# Patient Record
Sex: Female | Born: 1965
Health system: Southern US, Community
[De-identification: ages and names within clinical notes are randomized; demographics above are authoritative.]

## PROBLEM LIST (undated history)

## (undated) DIAGNOSIS — T4145XA Adverse effect of unspecified anesthetic, initial encounter: Secondary | ICD-10-CM

## (undated) DIAGNOSIS — K219 Gastro-esophageal reflux disease without esophagitis: Secondary | ICD-10-CM

## (undated) DIAGNOSIS — R350 Frequency of micturition: Secondary | ICD-10-CM

## (undated) DIAGNOSIS — I10 Essential (primary) hypertension: Secondary | ICD-10-CM

## (undated) DIAGNOSIS — F419 Anxiety disorder, unspecified: Secondary | ICD-10-CM

## (undated) DIAGNOSIS — T8859XA Other complications of anesthesia, initial encounter: Secondary | ICD-10-CM

## (undated) DIAGNOSIS — F329 Major depressive disorder, single episode, unspecified: Secondary | ICD-10-CM

## (undated) DIAGNOSIS — G8929 Other chronic pain: Secondary | ICD-10-CM

## (undated) DIAGNOSIS — D649 Anemia, unspecified: Secondary | ICD-10-CM

## (undated) DIAGNOSIS — D573 Sickle-cell trait: Secondary | ICD-10-CM

## (undated) DIAGNOSIS — M549 Dorsalgia, unspecified: Secondary | ICD-10-CM

## (undated) DIAGNOSIS — E785 Hyperlipidemia, unspecified: Secondary | ICD-10-CM

## (undated) DIAGNOSIS — F32A Depression, unspecified: Secondary | ICD-10-CM

## (undated) DIAGNOSIS — E039 Hypothyroidism, unspecified: Secondary | ICD-10-CM

## (undated) DIAGNOSIS — K5900 Constipation, unspecified: Secondary | ICD-10-CM

## (undated) DIAGNOSIS — R109 Unspecified abdominal pain: Secondary | ICD-10-CM

## (undated) DIAGNOSIS — G4730 Sleep apnea, unspecified: Secondary | ICD-10-CM

## (undated) DIAGNOSIS — M1990 Unspecified osteoarthritis, unspecified site: Secondary | ICD-10-CM

## (undated) HISTORY — DX: Depression, unspecified: F32.A

## (undated) HISTORY — DX: Hypothyroidism, unspecified: E03.9

## (undated) HISTORY — DX: Dorsalgia, unspecified: M54.9

## (undated) HISTORY — DX: Unspecified abdominal pain: R10.9

## (undated) HISTORY — DX: Constipation, unspecified: K59.00

## (undated) HISTORY — DX: Gastro-esophageal reflux disease without esophagitis: K21.9

## (undated) HISTORY — DX: Anemia, unspecified: D64.9

## (undated) HISTORY — DX: Anxiety disorder, unspecified: F41.9

## (undated) HISTORY — DX: Other chronic pain: G89.29

## (undated) HISTORY — DX: Hyperlipidemia, unspecified: E78.5

## (undated) HISTORY — PX: TUBAL LIGATION: SHX77

## (undated) HISTORY — DX: Sleep apnea, unspecified: G47.30

## (undated) HISTORY — DX: Unspecified osteoarthritis, unspecified site: M19.90

## (undated) HISTORY — DX: Major depressive disorder, single episode, unspecified: F32.9

## (undated) HISTORY — DX: Essential (primary) hypertension: I10

---

## 1999-10-03 ENCOUNTER — Encounter: Payer: Self-pay | Admitting: General Practice

## 1999-10-03 ENCOUNTER — Encounter: Admission: RE | Admit: 1999-10-03 | Discharge: 1999-10-03 | Payer: Self-pay | Admitting: General Practice

## 1999-12-15 DIAGNOSIS — G8929 Other chronic pain: Secondary | ICD-10-CM

## 1999-12-15 HISTORY — DX: Other chronic pain: G89.29

## 1999-12-24 ENCOUNTER — Encounter: Payer: Self-pay | Admitting: General Practice

## 1999-12-24 ENCOUNTER — Encounter: Admission: RE | Admit: 1999-12-24 | Discharge: 1999-12-24 | Payer: Self-pay | Admitting: Family Medicine

## 2000-02-25 ENCOUNTER — Encounter
Admission: RE | Admit: 2000-02-25 | Discharge: 2000-05-25 | Payer: Self-pay | Admitting: Physical Medicine & Rehabilitation

## 2000-05-20 ENCOUNTER — Emergency Department (HOSPITAL_COMMUNITY): Admission: EM | Admit: 2000-05-20 | Discharge: 2000-05-20 | Payer: Self-pay | Admitting: Emergency Medicine

## 2000-05-20 ENCOUNTER — Emergency Department (HOSPITAL_COMMUNITY): Admission: EM | Admit: 2000-05-20 | Discharge: 2000-05-21 | Payer: Self-pay | Admitting: Emergency Medicine

## 2001-11-11 ENCOUNTER — Encounter: Payer: Self-pay | Admitting: Emergency Medicine

## 2001-11-11 ENCOUNTER — Emergency Department (HOSPITAL_COMMUNITY): Admission: EM | Admit: 2001-11-11 | Discharge: 2001-11-11 | Payer: Self-pay | Admitting: Emergency Medicine

## 2001-12-14 DIAGNOSIS — E039 Hypothyroidism, unspecified: Secondary | ICD-10-CM

## 2001-12-14 HISTORY — DX: Hypothyroidism, unspecified: E03.9

## 2002-04-23 ENCOUNTER — Inpatient Hospital Stay (HOSPITAL_COMMUNITY): Admission: EM | Admit: 2002-04-23 | Discharge: 2002-04-26 | Payer: Self-pay | Admitting: *Deleted

## 2002-07-14 ENCOUNTER — Encounter: Payer: Self-pay | Admitting: General Practice

## 2002-07-14 ENCOUNTER — Encounter: Admission: RE | Admit: 2002-07-14 | Discharge: 2002-07-14 | Payer: Self-pay | Admitting: General Practice

## 2002-08-10 ENCOUNTER — Inpatient Hospital Stay (HOSPITAL_COMMUNITY): Admission: AD | Admit: 2002-08-10 | Discharge: 2002-08-14 | Payer: Self-pay

## 2002-08-10 ENCOUNTER — Encounter: Payer: Self-pay | Admitting: Emergency Medicine

## 2002-08-24 ENCOUNTER — Encounter: Payer: Self-pay | Admitting: General Practice

## 2002-08-24 ENCOUNTER — Encounter: Admission: RE | Admit: 2002-08-24 | Discharge: 2002-08-24 | Payer: Self-pay | Admitting: General Practice

## 2002-09-07 ENCOUNTER — Encounter: Admission: RE | Admit: 2002-09-07 | Discharge: 2002-10-16 | Payer: Self-pay | Admitting: General Practice

## 2002-09-11 ENCOUNTER — Ambulatory Visit (HOSPITAL_COMMUNITY): Admission: RE | Admit: 2002-09-11 | Discharge: 2002-09-11 | Payer: Self-pay | Admitting: General Surgery

## 2002-09-12 ENCOUNTER — Encounter (HOSPITAL_BASED_OUTPATIENT_CLINIC_OR_DEPARTMENT_OTHER): Payer: Self-pay | Admitting: General Surgery

## 2002-09-19 ENCOUNTER — Encounter: Payer: Self-pay | Admitting: Internal Medicine

## 2002-09-19 ENCOUNTER — Encounter (HOSPITAL_COMMUNITY): Admission: RE | Admit: 2002-09-19 | Discharge: 2002-12-18 | Payer: Self-pay | Admitting: Internal Medicine

## 2002-11-09 ENCOUNTER — Emergency Department (HOSPITAL_COMMUNITY): Admission: EM | Admit: 2002-11-09 | Discharge: 2002-11-09 | Payer: Self-pay | Admitting: Emergency Medicine

## 2002-12-13 ENCOUNTER — Encounter: Admission: RE | Admit: 2002-12-13 | Discharge: 2003-01-19 | Payer: Self-pay | Admitting: Orthopaedic Surgery

## 2002-12-14 HISTORY — PX: CYSTOCELE REPAIR: SHX163

## 2002-12-14 HISTORY — PX: BLADDER SUSPENSION: SHX72

## 2003-02-14 ENCOUNTER — Encounter: Payer: Self-pay | Admitting: General Practice

## 2003-02-14 ENCOUNTER — Encounter: Admission: RE | Admit: 2003-02-14 | Discharge: 2003-02-14 | Payer: Self-pay | Admitting: General Practice

## 2003-02-14 ENCOUNTER — Encounter (INDEPENDENT_AMBULATORY_CARE_PROVIDER_SITE_OTHER): Payer: Self-pay | Admitting: *Deleted

## 2003-04-27 ENCOUNTER — Observation Stay (HOSPITAL_COMMUNITY): Admission: RE | Admit: 2003-04-27 | Discharge: 2003-04-28 | Payer: Self-pay | Admitting: Urology

## 2003-09-08 ENCOUNTER — Emergency Department (HOSPITAL_COMMUNITY): Admission: EM | Admit: 2003-09-08 | Discharge: 2003-09-08 | Payer: Self-pay

## 2003-11-02 ENCOUNTER — Encounter: Admission: RE | Admit: 2003-11-02 | Discharge: 2003-11-02 | Payer: Self-pay | Admitting: Internal Medicine

## 2003-11-22 ENCOUNTER — Encounter: Admission: RE | Admit: 2003-11-22 | Discharge: 2003-11-22 | Payer: Self-pay | Admitting: Internal Medicine

## 2003-12-19 ENCOUNTER — Encounter: Admission: RE | Admit: 2003-12-19 | Discharge: 2003-12-19 | Payer: Self-pay | Admitting: Internal Medicine

## 2004-02-01 ENCOUNTER — Emergency Department (HOSPITAL_COMMUNITY): Admission: EM | Admit: 2004-02-01 | Discharge: 2004-02-01 | Payer: Self-pay | Admitting: Family Medicine

## 2004-02-12 ENCOUNTER — Encounter: Admission: RE | Admit: 2004-02-12 | Discharge: 2004-02-12 | Payer: Self-pay | Admitting: Internal Medicine

## 2004-02-26 ENCOUNTER — Encounter: Admission: RE | Admit: 2004-02-26 | Discharge: 2004-02-26 | Payer: Self-pay | Admitting: Internal Medicine

## 2004-04-29 ENCOUNTER — Emergency Department (HOSPITAL_COMMUNITY): Admission: EM | Admit: 2004-04-29 | Discharge: 2004-04-30 | Payer: Self-pay | Admitting: Emergency Medicine

## 2004-05-14 ENCOUNTER — Encounter (INDEPENDENT_AMBULATORY_CARE_PROVIDER_SITE_OTHER): Payer: Self-pay | Admitting: *Deleted

## 2004-05-14 ENCOUNTER — Encounter: Admission: RE | Admit: 2004-05-14 | Discharge: 2004-05-14 | Payer: Self-pay | Admitting: Internal Medicine

## 2004-05-14 ENCOUNTER — Other Ambulatory Visit: Admission: RE | Admit: 2004-05-14 | Discharge: 2004-05-14 | Payer: Self-pay | Admitting: Internal Medicine

## 2004-05-14 LAB — CONVERTED CEMR LAB: Pap Smear: NORMAL

## 2004-07-30 ENCOUNTER — Encounter: Admission: RE | Admit: 2004-07-30 | Discharge: 2004-07-30 | Payer: Self-pay | Admitting: Internal Medicine

## 2004-07-31 ENCOUNTER — Encounter: Admission: RE | Admit: 2004-07-31 | Discharge: 2004-08-15 | Payer: Self-pay | Admitting: Orthopaedic Surgery

## 2004-08-11 ENCOUNTER — Encounter: Admission: RE | Admit: 2004-08-11 | Discharge: 2004-08-11 | Payer: Self-pay | Admitting: Internal Medicine

## 2004-09-29 ENCOUNTER — Ambulatory Visit: Payer: Self-pay | Admitting: Internal Medicine

## 2004-10-01 ENCOUNTER — Ambulatory Visit: Payer: Self-pay | Admitting: Internal Medicine

## 2004-11-04 ENCOUNTER — Emergency Department (HOSPITAL_COMMUNITY): Admission: EM | Admit: 2004-11-04 | Discharge: 2004-11-04 | Payer: Self-pay | Admitting: Family Medicine

## 2004-12-14 HISTORY — PX: REVISION URINARY SLING: SHX6213

## 2004-12-25 ENCOUNTER — Ambulatory Visit: Payer: Self-pay | Admitting: Internal Medicine

## 2005-01-09 ENCOUNTER — Emergency Department (HOSPITAL_COMMUNITY): Admission: EM | Admit: 2005-01-09 | Discharge: 2005-01-09 | Payer: Self-pay | Admitting: Family Medicine

## 2005-04-15 ENCOUNTER — Ambulatory Visit: Payer: Self-pay | Admitting: Internal Medicine

## 2005-05-20 ENCOUNTER — Ambulatory Visit: Payer: Self-pay | Admitting: Internal Medicine

## 2005-06-03 ENCOUNTER — Ambulatory Visit: Payer: Self-pay | Admitting: Internal Medicine

## 2005-09-22 ENCOUNTER — Ambulatory Visit: Payer: Self-pay | Admitting: Hospitalist

## 2005-10-16 ENCOUNTER — Ambulatory Visit (HOSPITAL_COMMUNITY): Admission: RE | Admit: 2005-10-16 | Discharge: 2005-10-16 | Payer: Self-pay | Admitting: Urology

## 2006-02-12 ENCOUNTER — Ambulatory Visit: Payer: Self-pay | Admitting: Internal Medicine

## 2006-04-07 ENCOUNTER — Emergency Department (HOSPITAL_COMMUNITY): Admission: EM | Admit: 2006-04-07 | Discharge: 2006-04-07 | Payer: Self-pay | Admitting: Family Medicine

## 2006-06-01 ENCOUNTER — Ambulatory Visit: Payer: Self-pay | Admitting: Internal Medicine

## 2006-06-15 ENCOUNTER — Ambulatory Visit: Payer: Self-pay | Admitting: Internal Medicine

## 2006-07-27 ENCOUNTER — Ambulatory Visit: Payer: Self-pay | Admitting: Internal Medicine

## 2006-10-21 ENCOUNTER — Encounter (INDEPENDENT_AMBULATORY_CARE_PROVIDER_SITE_OTHER): Payer: Self-pay | Admitting: *Deleted

## 2006-10-21 DIAGNOSIS — F329 Major depressive disorder, single episode, unspecified: Secondary | ICD-10-CM

## 2006-10-21 DIAGNOSIS — E039 Hypothyroidism, unspecified: Secondary | ICD-10-CM | POA: Insufficient documentation

## 2006-11-06 ENCOUNTER — Encounter: Admission: RE | Admit: 2006-11-06 | Discharge: 2006-11-06 | Payer: Self-pay | Admitting: Orthopaedic Surgery

## 2006-11-24 ENCOUNTER — Encounter: Admission: RE | Admit: 2006-11-24 | Discharge: 2006-12-17 | Payer: Self-pay | Admitting: Orthopaedic Surgery

## 2006-12-03 ENCOUNTER — Other Ambulatory Visit: Admission: RE | Admit: 2006-12-03 | Discharge: 2006-12-03 | Payer: Self-pay | Admitting: Internal Medicine

## 2006-12-03 ENCOUNTER — Ambulatory Visit: Payer: Self-pay | Admitting: Internal Medicine

## 2006-12-03 DIAGNOSIS — M1990 Unspecified osteoarthritis, unspecified site: Secondary | ICD-10-CM | POA: Insufficient documentation

## 2006-12-03 DIAGNOSIS — J309 Allergic rhinitis, unspecified: Secondary | ICD-10-CM | POA: Insufficient documentation

## 2006-12-03 DIAGNOSIS — Z9889 Other specified postprocedural states: Secondary | ICD-10-CM

## 2006-12-03 DIAGNOSIS — F319 Bipolar disorder, unspecified: Secondary | ICD-10-CM

## 2006-12-14 HISTORY — PX: CYSTOSCOPY: SUR368

## 2007-02-11 ENCOUNTER — Telehealth (INDEPENDENT_AMBULATORY_CARE_PROVIDER_SITE_OTHER): Payer: Self-pay | Admitting: *Deleted

## 2007-02-28 ENCOUNTER — Ambulatory Visit (HOSPITAL_COMMUNITY): Admission: RE | Admit: 2007-02-28 | Discharge: 2007-02-28 | Payer: Self-pay | Admitting: Urology

## 2007-07-01 ENCOUNTER — Emergency Department (HOSPITAL_COMMUNITY): Admission: EM | Admit: 2007-07-01 | Discharge: 2007-07-01 | Payer: Self-pay | Admitting: Emergency Medicine

## 2007-11-05 ENCOUNTER — Emergency Department (HOSPITAL_COMMUNITY): Admission: EM | Admit: 2007-11-05 | Discharge: 2007-11-05 | Payer: Self-pay | Admitting: Emergency Medicine

## 2007-11-09 ENCOUNTER — Emergency Department (HOSPITAL_COMMUNITY): Admission: EM | Admit: 2007-11-09 | Discharge: 2007-11-09 | Payer: Self-pay | Admitting: Neurosurgery

## 2007-12-15 DIAGNOSIS — R109 Unspecified abdominal pain: Secondary | ICD-10-CM

## 2007-12-15 HISTORY — DX: Unspecified abdominal pain: R10.9

## 2007-12-23 ENCOUNTER — Telehealth: Payer: Self-pay | Admitting: *Deleted

## 2008-01-26 ENCOUNTER — Encounter (INDEPENDENT_AMBULATORY_CARE_PROVIDER_SITE_OTHER): Payer: Self-pay | Admitting: Internal Medicine

## 2008-01-26 ENCOUNTER — Encounter: Admission: RE | Admit: 2008-01-26 | Discharge: 2008-01-26 | Payer: Self-pay | Admitting: Internal Medicine

## 2008-01-26 ENCOUNTER — Ambulatory Visit: Payer: Self-pay | Admitting: Internal Medicine

## 2008-01-26 ENCOUNTER — Inpatient Hospital Stay (HOSPITAL_COMMUNITY): Admission: AD | Admit: 2008-01-26 | Discharge: 2008-01-27 | Payer: Self-pay | Admitting: Infectious Diseases

## 2008-01-26 ENCOUNTER — Ambulatory Visit: Payer: Self-pay | Admitting: Infectious Diseases

## 2008-01-26 LAB — CONVERTED CEMR LAB
Beta hcg, urine, semiquantitative: NEGATIVE
Blood in Urine, dipstick: NEGATIVE
Ketones, urine, test strip: NEGATIVE

## 2008-01-30 ENCOUNTER — Telehealth (INDEPENDENT_AMBULATORY_CARE_PROVIDER_SITE_OTHER): Payer: Self-pay | Admitting: Internal Medicine

## 2008-01-30 ENCOUNTER — Encounter (INDEPENDENT_AMBULATORY_CARE_PROVIDER_SITE_OTHER): Payer: Self-pay | Admitting: Hospitalist

## 2008-02-02 ENCOUNTER — Ambulatory Visit: Payer: Self-pay | Admitting: Internal Medicine

## 2008-02-02 ENCOUNTER — Encounter (INDEPENDENT_AMBULATORY_CARE_PROVIDER_SITE_OTHER): Payer: Self-pay | Admitting: *Deleted

## 2008-02-02 DIAGNOSIS — G5600 Carpal tunnel syndrome, unspecified upper limb: Secondary | ICD-10-CM

## 2008-02-02 LAB — CONVERTED CEMR LAB
Bilirubin Urine: NEGATIVE
Hemoglobin, Urine: NEGATIVE
Protein, ur: NEGATIVE mg/dL
Specific Gravity, Urine: 1.027 (ref 1.005–1.03)
Urine Glucose: NEGATIVE mg/dL
Urobilinogen, UA: 0.2 (ref 0.0–1.0)

## 2008-02-03 ENCOUNTER — Telehealth (INDEPENDENT_AMBULATORY_CARE_PROVIDER_SITE_OTHER): Payer: Self-pay | Admitting: *Deleted

## 2008-02-17 ENCOUNTER — Ambulatory Visit: Payer: Self-pay | Admitting: Hospitalist

## 2008-02-17 DIAGNOSIS — N950 Postmenopausal bleeding: Secondary | ICD-10-CM | POA: Insufficient documentation

## 2008-02-20 ENCOUNTER — Other Ambulatory Visit: Admission: RE | Admit: 2008-02-20 | Discharge: 2008-02-20 | Payer: Self-pay | Admitting: Gynecology

## 2008-02-20 ENCOUNTER — Encounter (INDEPENDENT_AMBULATORY_CARE_PROVIDER_SITE_OTHER): Payer: Self-pay | Admitting: *Deleted

## 2008-03-13 ENCOUNTER — Ambulatory Visit (HOSPITAL_BASED_OUTPATIENT_CLINIC_OR_DEPARTMENT_OTHER): Admission: RE | Admit: 2008-03-13 | Discharge: 2008-03-13 | Payer: Self-pay | Admitting: Urology

## 2008-05-09 ENCOUNTER — Encounter (INDEPENDENT_AMBULATORY_CARE_PROVIDER_SITE_OTHER): Payer: Self-pay | Admitting: *Deleted

## 2008-05-09 ENCOUNTER — Ambulatory Visit (HOSPITAL_COMMUNITY): Admission: RE | Admit: 2008-05-09 | Discharge: 2008-05-09 | Payer: Self-pay | Admitting: *Deleted

## 2008-05-09 ENCOUNTER — Ambulatory Visit: Payer: Self-pay | Admitting: *Deleted

## 2008-05-09 DIAGNOSIS — T7411XA Adult physical abuse, confirmed, initial encounter: Secondary | ICD-10-CM | POA: Insufficient documentation

## 2008-05-10 ENCOUNTER — Encounter (INDEPENDENT_AMBULATORY_CARE_PROVIDER_SITE_OTHER): Payer: Self-pay | Admitting: *Deleted

## 2008-05-11 ENCOUNTER — Encounter: Payer: Self-pay | Admitting: Licensed Clinical Social Worker

## 2008-07-18 ENCOUNTER — Telehealth: Payer: Self-pay | Admitting: *Deleted

## 2008-07-23 ENCOUNTER — Emergency Department (HOSPITAL_COMMUNITY): Admission: EM | Admit: 2008-07-23 | Discharge: 2008-07-23 | Payer: Self-pay | Admitting: Emergency Medicine

## 2008-07-24 ENCOUNTER — Encounter: Payer: Self-pay | Admitting: *Deleted

## 2008-07-24 ENCOUNTER — Encounter (INDEPENDENT_AMBULATORY_CARE_PROVIDER_SITE_OTHER): Payer: Self-pay | Admitting: Internal Medicine

## 2008-07-24 ENCOUNTER — Telehealth: Payer: Self-pay | Admitting: *Deleted

## 2008-07-24 ENCOUNTER — Ambulatory Visit: Payer: Self-pay | Admitting: Internal Medicine

## 2008-07-24 LAB — CONVERTED CEMR LAB: TSH: 2.412 microintl units/mL (ref 0.350–4.50)

## 2008-08-10 ENCOUNTER — Telehealth: Payer: Self-pay | Admitting: Licensed Clinical Social Worker

## 2008-08-10 ENCOUNTER — Inpatient Hospital Stay (HOSPITAL_COMMUNITY): Admission: EM | Admit: 2008-08-10 | Discharge: 2008-08-12 | Payer: Self-pay | Admitting: *Deleted

## 2008-08-10 ENCOUNTER — Ambulatory Visit: Payer: Self-pay | Admitting: *Deleted

## 2008-08-10 ENCOUNTER — Telehealth (INDEPENDENT_AMBULATORY_CARE_PROVIDER_SITE_OTHER): Payer: Self-pay | Admitting: Internal Medicine

## 2008-08-13 ENCOUNTER — Telehealth: Payer: Self-pay | Admitting: Licensed Clinical Social Worker

## 2008-10-15 ENCOUNTER — Emergency Department (HOSPITAL_COMMUNITY): Admission: EM | Admit: 2008-10-15 | Discharge: 2008-10-15 | Payer: Self-pay | Admitting: Emergency Medicine

## 2008-12-24 ENCOUNTER — Telehealth (INDEPENDENT_AMBULATORY_CARE_PROVIDER_SITE_OTHER): Payer: Self-pay | Admitting: Internal Medicine

## 2009-02-22 ENCOUNTER — Telehealth (INDEPENDENT_AMBULATORY_CARE_PROVIDER_SITE_OTHER): Payer: Self-pay | Admitting: Internal Medicine

## 2009-04-22 ENCOUNTER — Encounter (INDEPENDENT_AMBULATORY_CARE_PROVIDER_SITE_OTHER): Payer: Self-pay | Admitting: Internal Medicine

## 2009-04-22 ENCOUNTER — Ambulatory Visit: Payer: Self-pay | Admitting: *Deleted

## 2009-04-22 ENCOUNTER — Encounter (INDEPENDENT_AMBULATORY_CARE_PROVIDER_SITE_OTHER): Payer: Self-pay | Admitting: *Deleted

## 2009-04-22 LAB — CONVERTED CEMR LAB
Albumin: 4.2 g/dL (ref 3.5–5.2)
Alkaline Phosphatase: 96 units/L (ref 39–117)
BUN: 10 mg/dL (ref 6–23)
CO2: 21 meq/L (ref 19–32)
GFR calc Af Amer: >60 mL/min (ref >60)
Glucose, Bld: 90 mg/dL (ref 70–99)
TSH: 4.134 microintl units/mL (ref 0.350–4.500)
Total Bilirubin: 0.4 mg/dL (ref 0.3–1.2)
Total Protein: 7.8 g/dL (ref 6.0–8.3)

## 2009-04-22 LAB — HM PAP SMEAR: HM Pap smear: NEGATIVE

## 2009-05-23 ENCOUNTER — Telehealth: Payer: Self-pay | Admitting: Licensed Clinical Social Worker

## 2009-05-24 ENCOUNTER — Encounter: Payer: Self-pay | Admitting: Licensed Clinical Social Worker

## 2010-01-29 ENCOUNTER — Ambulatory Visit: Payer: Self-pay | Admitting: Internal Medicine

## 2010-05-07 ENCOUNTER — Telehealth (INDEPENDENT_AMBULATORY_CARE_PROVIDER_SITE_OTHER): Payer: Self-pay | Admitting: *Deleted

## 2010-05-07 ENCOUNTER — Ambulatory Visit: Payer: Self-pay | Admitting: Internal Medicine

## 2010-05-07 ENCOUNTER — Ambulatory Visit (HOSPITAL_COMMUNITY): Admission: RE | Admit: 2010-05-07 | Discharge: 2010-05-07 | Payer: Self-pay | Admitting: Internal Medicine

## 2010-05-16 ENCOUNTER — Telehealth (INDEPENDENT_AMBULATORY_CARE_PROVIDER_SITE_OTHER): Payer: Self-pay | Admitting: Internal Medicine

## 2010-05-16 ENCOUNTER — Encounter (INDEPENDENT_AMBULATORY_CARE_PROVIDER_SITE_OTHER): Payer: Self-pay | Admitting: Internal Medicine

## 2010-06-03 ENCOUNTER — Ambulatory Visit: Payer: Self-pay | Admitting: Internal Medicine

## 2010-06-03 ENCOUNTER — Ambulatory Visit: Payer: Self-pay | Admitting: Family Medicine

## 2010-06-03 LAB — CONVERTED CEMR LAB: TSH: 3.904 microintl units/mL (ref 0.350–4.5)

## 2010-06-18 ENCOUNTER — Telehealth (INDEPENDENT_AMBULATORY_CARE_PROVIDER_SITE_OTHER): Payer: Self-pay | Admitting: *Deleted

## 2010-06-18 ENCOUNTER — Encounter: Admission: RE | Admit: 2010-06-18 | Discharge: 2010-07-09 | Payer: Self-pay | Admitting: Family Medicine

## 2010-06-19 ENCOUNTER — Ambulatory Visit: Payer: Self-pay | Admitting: Internal Medicine

## 2010-06-19 DIAGNOSIS — I10 Essential (primary) hypertension: Secondary | ICD-10-CM

## 2010-06-19 LAB — BASIC METABOLIC PANEL
Creatinine: 0.7 mg/dL (ref 0.5–1.1)
Potassium: 4 mmol/L (ref 3.4–5.3)

## 2010-06-19 LAB — CONVERTED CEMR LAB
CO2: 22 meq/L (ref 19–32)
TSH: 2.682 microintl units/mL (ref 0.350–4.5)

## 2010-06-19 LAB — TSH: TSH: 2.68 u[IU]/mL (ref 0.41–5.90)

## 2010-07-02 ENCOUNTER — Encounter: Payer: Self-pay | Admitting: Sports Medicine

## 2010-07-09 ENCOUNTER — Encounter: Payer: Self-pay | Admitting: Sports Medicine

## 2010-07-09 ENCOUNTER — Telehealth: Payer: Self-pay | Admitting: Internal Medicine

## 2010-07-15 ENCOUNTER — Ambulatory Visit: Payer: Self-pay | Admitting: Internal Medicine

## 2010-09-19 ENCOUNTER — Telehealth: Payer: Self-pay | Admitting: Internal Medicine

## 2010-09-19 ENCOUNTER — Encounter: Payer: Self-pay | Admitting: Internal Medicine

## 2010-09-19 ENCOUNTER — Ambulatory Visit: Payer: Self-pay | Admitting: Internal Medicine

## 2010-09-19 DIAGNOSIS — L500 Allergic urticaria: Secondary | ICD-10-CM | POA: Insufficient documentation

## 2010-11-21 ENCOUNTER — Ambulatory Visit (HOSPITAL_COMMUNITY)
Admission: RE | Admit: 2010-11-21 | Discharge: 2010-11-21 | Payer: Self-pay | Source: Home / Self Care | Attending: Internal Medicine | Admitting: Internal Medicine

## 2010-11-21 ENCOUNTER — Ambulatory Visit: Payer: Self-pay | Admitting: Internal Medicine

## 2010-11-21 DIAGNOSIS — M5412 Radiculopathy, cervical region: Secondary | ICD-10-CM | POA: Insufficient documentation

## 2010-11-22 LAB — CONVERTED CEMR LAB
HDL: 73 mg/dL (ref >39)
LDL Cholesterol: 124 mg/dL — ABNORMAL HIGH (ref 0–99)
VLDL: 17 mg/dL (ref 0–40)

## 2010-12-02 ENCOUNTER — Ambulatory Visit (HOSPITAL_COMMUNITY)
Admission: RE | Admit: 2010-12-02 | Discharge: 2010-12-02 | Payer: Self-pay | Source: Home / Self Care | Attending: Internal Medicine | Admitting: Internal Medicine

## 2010-12-02 LAB — HM MAMMOGRAPHY: HM Mammogram: NEGATIVE

## 2010-12-09 ENCOUNTER — Ambulatory Visit: Payer: Self-pay | Admitting: Internal Medicine

## 2011-01-13 NOTE — Progress Notes (Signed)
Summary: Labs   Phone Note Call from Patient   Caller: Patient Call For: Mliss Sax MD Summary of Call: Call from pt requesting lab results.  Given results of Thyroid test.  Pt said that she was recently started on HCTZ.  b  Pt said that she initially had a lot of urination.  It has leveled off. Said that she is feeling a little tired with a little dizziness when she stands to quickly.  Pt said that she has a recheck appointment next week. Angelina Ok RN  July 09, 2010 2:35 PM  Initial call taken by: Angelina Ok RN,  July 09, 2010 2:35 PM  Follow-up for Phone Call        Dr. Allena Katz, since you have seen this patient and have ordered the tests, do you mind if you call the patient and discuss the results of the test. I have gotten this message from the triage nurse but I think  it is more appropriate for you to discuss this since you have seen her and why you have ordered the tests.  Thank you and let me know if I can be of any assistance. Arhum Peeples Follow-up by: Mliss Sax MD,  July 09, 2010 4:36 PM     Appended Document: Labs  Dr. Aldine Contes, I will call pt and talk with her. Thanks. Daleen Bo

## 2011-01-13 NOTE — Assessment & Plan Note (Signed)
Summary: LEFT ANKLE PAIN/MJD   Vital Signs:  Patient profile:   45 year old female BP sitting:   155 / 100  Vitals Entered By: Lillia Pauls CMA (June 03, 2010 2:35 PM)  Primary Care Provider:  Zara Council MD   History of Present Illness: 45 yo F here for left ankle pain  Patient has prior h/o ankle sprains left ankle in 2005 and 2007. Each time pain resolved with rest and physical therapy Current episode occurred 1 month ago when suffered an inversion injury to left ankle when walking Developed pain and swelling mostly lateral ankle. Has tried icing, excedrin and tylenol. Also done simple range of motion exercises at home. Does not own crutches and has continued to walk and work (though did take 1 week off after injury). Ankle radiographs done on 5/25 showed no evidence of fracture but soft tissue swelling laterally. Does not own an ankle brace but given one at her OV this am with PCP - feels more stable with this but still hurts to weight bear. Noted patient had Advil on allergy list but twice has taken ibuprofen without side effects Was prescribed hydrocodone also - states causes her to itch but tempered with benadryl - advised to stop this.  Allergies (verified): 1)  ! Pcn 2)  ! Aspirin 3)  ! Codeine 4)  Hydrocodone  Physical Exam  General:  alert and well-developed.   Msk:  L ankle: Mild swelling distal to lateral malleolus and over ATFL. TTP over ATFL, anterior ankle joint.  No focal TTP base 5th MT, navicular, post med or lat malleoli. FROM and strength 5/5 resisted plantar/dorsiflexion, int and ext rotation. 1+ anterior drawer and talar tilt - pain with testing. NV intact distally with 2+ DP pulses.   Impression & Recommendations:  Problem # 1:  ANKLE PAIN, LEFT (ICD-719.47) Assessment Deteriorated  Will refer back to physical therapy for ROM, exercises, and modalities.  Use ASO at all times except sleeping and bathing for next 4 weeks and out of this when  doing home exercise program.  Icing, aleve as needed (able to tolerate ibuprofen despite advil being listed as an allergy).  Crutches given today but advised to wean out of these as tolerated - discussed wean to one crutch then off of these.  F/u in 4 weeks for a recheck.  If not improving would do ultrasound of ankle - on exam all tendons intact and reviewed x-ray which shows no bony injury.  Orders: Crutches-[facility] ([id number])  Complete Medication List: 1)  Synthroid 88 Mcg Tabs (Levothyroxine sodium) .... Take 1 tablet by mouth once a day 2)  Vesicare 10 Mg Tabs (Solifenacin succinate) .... Take 1 tablet by mouth once a day 3)  Vicodin 5-500 Mg Tabs (Hydrocodone-acetaminophen) .... Four times a day as needed for pain, every six hours  Patient Instructions: 1)  Wear the ankle brace at all times when you are up and walking around - do not have to sleep in this and can shower without it. 2)  Use crutches, weight bear as tolerated, transition to 1 crutch when able then off the crutches completely. 3)  Ice ankle for 15 minutes 3-4 times a day especially at the end of the day. 4)  Simple up/down ankle exercises 3 sets of 15 once a day. 5)  Use your big toe and draw the alphabet in the air twice as well. 6)  Physical therapy will call you to set up your initial appointment and they will give  you more strengthening exercises as you get better. 7)  For pain/swelling try aleve 2 tablets twice a day with food. 8)  Follow up with [place] in 4 weeks.

## 2011-01-13 NOTE — Progress Notes (Signed)
Summary: phone/gg  Phone Note Call from Patient   Caller: Patient Summary of Call: Pt called with c/o breaking out in hives, she takes benadryl  which  makes them resolve but they return the next day. This has been going on for 2 weeks.  No known cause.  outbreak on breast and thighs, also has swelling to mouth.  Will see today. Initial call taken by: Merrie Roof RN,  September 19, 2010 11:43 AM  Follow-up for Phone Call        Pt was placed into open appt slot in Dr Margaretmary Eddy clinic Follow-up by: Blanch Media MD,  September 19, 2010 11:57 AM
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
## 2011-01-13 NOTE — Letter (Signed)
Summary: Generic Letter  [facility]  [address]   [place], [place] [address]   Phone: [phone]  Fax: [phone]    05/16/2010  Shannon Newton DOB 16-Jan-1966   TO WHOM IT MAY CONCERN  For medical reason please excure Shannon Newton from wearing dress shoes. Please contact this clinic if you need further info.       Sincerely,   Zara Council MD

## 2011-01-13 NOTE — Letter (Signed)
Summary: Out of Work  [facility]  [address]   [place], [place] [address]   Phone: [phone]  Fax: [phone]    September 19, 2010   Employee:  Shannon Newton [employer]    To Whom It May Concern:   For Medical reasons, please excuse the above named employee from work for the following dates:  Start:   09/15/10 End:   09/19/10 If you need additional information, please feel free to contact our office.         Sincerely,    Riddhish Sherryll Burger MD

## 2011-01-13 NOTE — Progress Notes (Signed)
Summary: letter/ hla  Phone Note Call from Patient   Summary of Call: pt calls requesting a letter to [employer] stating that she can not wear dress shoes at due to her foot injury and that atheletic shoes are best at this time. could we do this today for her? Initial call taken by: Marin Roberts RN,  May 16, 2010 9:50 AM  Follow-up for Phone Call        letter done.  Follow-up by: Zara Council MD,  May 20, 2010 9:27 AM

## 2011-01-13 NOTE — Letter (Signed)
Summary: Out of Work  [facility]  [address]   [place], [place] [address]   Phone: [phone]  Fax: [phone]    September 19, 2010   Employee:  Shannon Newton [employer]    To Whom It May Concern:   For Medical reasons, please excuse the above named employee from work for the following dates:  Start:    End:    If you need additional information, please feel free to contact our office.         Sincerely,    Riddhish Sherryll Burger MD
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
## 2011-01-13 NOTE — Letter (Signed)
Summary: [facility] PT Referral Form  [facility] PT Referral Form   Imported By: Marily Memos 06/04/2010 08:49:52  _____________________________________________________________________  External Attachment:    Type:   Image     Comment:   External Document

## 2011-01-13 NOTE — Progress Notes (Signed)
Summary: ankle/ appt/ hla  Phone Note Call from Patient   Summary of Call: pt calls states she "turned" her L ankle 2 days ago, has gotten progressively worse and now difficult to ambulate, desires to be seen in clinic, added to empty 1430 slot on dr shah's schedule today. Initial call taken by: Marin Roberts RN,  May 07, 2010 2:11 PM  Follow-up for Phone Call        Noted. Follow-up by: Zoila Shutter MD,  May 07, 2010 2:13 PM

## 2011-01-13 NOTE — Miscellaneous (Signed)
Summary: [facility]  [facility]   Imported By: Marily Memos 07/15/2010 12:22:27  _____________________________________________________________________  External Attachment:    Type:   Image     Comment:   External Document

## 2011-01-13 NOTE — Progress Notes (Signed)
Summary: refill/ hla  Phone Note Refill Request Message from:  Patient on June 18, 2010 11:45 AM  Refills Requested: Medication #1:  VESICARE 10 MG TABS Take 1 tablet by mouth once a day   Dosage confirmed as above?Dosage Confirmed Initial call taken by: Marin Roberts RN,  June 18, 2010 11:46 AM    Prescriptions: VESICARE 10 MG TABS (SOLIFENACIN SUCCINATE) Take 1 tablet by mouth once a day  #31 x 5   Entered and Authorized by:   Zoila Shutter MD   Signed by:   Zoila Shutter MD on 06/18/2010   Method used:   Electronically to        [facility] [address] (retail)       [phone]       [place], [place]  [address]       Ph: [phone]       Fax: [phone]   RxID:   [id number]

## 2011-01-13 NOTE — Miscellaneous (Signed)
Summary: Rehab Report  Rehab Report   Imported By: Marily Memos 07/02/2010 10:18:11  _____________________________________________________________________  External Attachment:    Type:   Image     Comment:   External Document
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
## 2011-02-08 ENCOUNTER — Encounter: Payer: Self-pay | Admitting: Internal Medicine

## 2011-03-05 ENCOUNTER — Telehealth: Payer: Self-pay | Admitting: *Deleted

## 2011-03-05 NOTE — Telephone Encounter (Signed)
MARCH 22, 012 CALLED THE PAIN CLINIC [phone]) TO FOLLOW UP ON THE PAIN REFERRAL THAT WAS FAXED ON DEC. 19. 2011, TO SEE IF APPT. WAS MADE FOR THIS PATIENT.  APPT. IS SCHEDULED FOR AUGUST 3, 01. /  @ 2:30PM.  PATIENT WAS CONTACTED BY THE PAIN CLINIC OFFICE.  LELA STURDIVANT NTII.

## 2011-04-28 NOTE — Op Note (Signed)
NAMEJERRICA, Shannon Newton                ACCOUNT NO.:  [id number]   MEDICAL RECORD NO.:  [id number]          PATIENT TYPE:  AMB   LOCATION:  [facility]                         FACILITY:  [facility]   PHYSICIAN:  Martina Sinner, MD DATE OF BIRTH:  1966-08-16   DATE OF PROCEDURE:  03/13/2008  DATE OF DISCHARGE:                               OPERATIVE REPORT   PREOPERATIVE DIAGNOSES:  Extruded vaginal sling.   POSTOPERATIVE DIAGNOSES:  Extruded vaginal sling.   SURGERY:  Transvaginal excision of extruded vaginal sling plus  cystoscopy.   SURGEON:  Martina Sinner, MD   ASSISTANT:  Delman Kitten.   INDICATIONS FOR PROCEDURE:  [name] had a sling followed by a  revision.  She is dramatically improved but had extrusion of the  anterior vaginal wall requiring Dr. Brunilda Payor to remove the suburethral  component.   She came back with more extrusion.  She was having foul-smelling  discharge.  Her incontinence is still significant improved.   DESCRIPTION OF PROCEDURE:  The patient is prepped and draped in the  usual fashion.  Extra care was taken with leg positioning to minimize  risk of compartment syndrome neuropathy and DVT.  She did have 3 or 4 cm  of uterine loss of support with a cystocele.  I used a weighted vaginal  speculum, curved cerebellar, a ring retractor and some sponges placed  cephalad in the vagina to flatten the surgical site.  Exposure was very  good.  One could see the free edge of the sling.   Even though the mucosa was a little bit stuck I sharply dissected the  vaginal mucosa medially approximately a half a centimeter and also  laterally near the pelvic sidewall after making a straight incision  approximately 3 cm in length parallel to the sidewall with its middle  aspect the extruded sling.  I placed a tonsil on the tip of the sling  and traced it up almost all the way to the endopelvic fascia.  I used  sharp dissection.  I then transected the sling.   Before the procedure  I had cystoscoped the patient and the bladder and  urethra were fine other than her cystocele.  At the end of the case I  cystoscoped her again and the bladder and urethra were fine and there  was efflux of indigo carmine from the left ureteral orifice.   Cautery was used for minimal bleeding.  I used three loose interrupted 2-  0 Vicryl sutures, allowing for any drainage.  Vaginal pack was inserted.  Bladder was emptied.  A Foley catheter was not utilized.           ______________________________  Martina Sinner, MD  Electronically Signed     SAM/MEDQ  D:  03/13/2008  T:  03/13/2008  Job:  [id number]
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
## 2011-05-01 NOTE — Op Note (Signed)
Shannon Newton, Shannon Newton                ACCOUNT NO.:  [id number]   MEDICAL RECORD NO.:  [id number]          PATIENT TYPE:  AMB   LOCATION:  DAY                          FACILITY:  [facility]   PHYSICIAN:  Lindaann Slough, M.D.  DATE OF BIRTH:  [birth year]   DATE OF PROCEDURE:  10/16/2005  DATE OF DISCHARGE:                                 OPERATIVE REPORT   PREOPERATIVE DIAGNOSIS:  Extruded vaginal sling.   POSTOPERATIVE DIAGNOSIS:  Extruded vaginal sling.   PROCEDURE:  1.  Excision of extruded sling.  2.  Cystourethroscopy.   SURGEON:  Danae Chen, MD.   ASSISTANT:  Glade Nurse, MD.   ANESTHESIA:  General endotracheal.   SPECIMEN:  Extruded sling.   DESCRIPTION OF PROCEDURE:  The patient was identified by her wrist bracelet  and brought to room 12 where she received preprocedural antibiotics as well  as general anesthesia. She was prepped and draped in the usual sterile  fashion taking care to minimize the risk of peripheral neuropathy and  compartment syndrome. Next, we placed a weighted speculum in her vaginal  vault which exposed her anterior vaginal wall. A Foley catheter was placed,  clear urine output was obtained, the Foley catheter was capped off the  bladder was drained. We examined the anterior wall of the  urethra. Over the  mid urethral area, there was a small 0.5 x 0.5 cm extruded sling. We grasped  this and divided it at the point it entered the vaginal mucosa on each side.  We then passed it off the field.  With sharp dissection, we freshened up the  edges around the extruded portion. We then closed the vaginal epithelium  primarily with a running 2-0 Vicryl. Next, we removed the Foley catheter.  The patient underwent pan cystourethroscopy which demonstrated normal  urethra without any evidence of erosion, bleeding or foreign bodies. The  bladder was entered, bilateral ureteral orifices were noted to be in their  normal anatomic position effluxing clear bilaterally.  There were no  trabeculations, mucosal abnormalities or foreign bodies within the bladder.  This was done with 12  and 70 degree lenses with approximately 400 mL of sterile water. Next, a  Foley catheter was used to drain the bladder. The patient was reversed from  her anesthesia which she tolerated without complication. Please note Dr.  Su Grand was present and participated in all aspects of this case.     ______________________________  Glade Nurse, MD      Lindaann Slough, M.D.  Electronically Signed    MT/MEDQ  D:  10/16/2005  T:  10/16/2005  Job:  [id number]

## 2011-05-01 NOTE — Op Note (Signed)
Shannon Newton, Shannon Newton                ACCOUNT NO.:  [id number]   MEDICAL RECORD NO.:  [id number]          PATIENT TYPE:  AMB   LOCATION:  DAY                          FACILITY:  [facility]   PHYSICIAN:  Lindaann Slough, M.D.  DATE OF BIRTH:  December 30, 1965   DATE OF PROCEDURE:  11/02/2005  DATE OF DISCHARGE:  10/16/2005                                 OPERATIVE REPORT   PREOPERATIVE DIAGNOSIS:  Extruded vaginal sling.   POSTOPERATIVE DIAGNOSIS:  Extruded vaginal sling.   PROCEDURE:  1.  Removal of extruded sling.  2.  Cystourethroscopy.   SURGEON:  Ezzie Dural, M.D.   ASSISTANT:  Glade Nurse, M.D.   ANESTHESIA:  General endotracheal.   SPECIMEN:  Section of extruded urethral sling.   PROCEDURE:  The patient was identified by wrist bracelet and brought to Room  12 where she received preprocedure antibiotics and was administered general  anesthesia. She was prepped and draped in usual sterile fashion, taking  great care to minimize peripheral neuropathy and compartment syndrome. Next,  we inserted an 18-French Foley catheter in her urinary bladder which was  drained of clear urine. Weighted vaginal speculum was placed in the vagina,  and we examined the anterior vaginal wall. A small area of sling extrusion  was noted over the mid urethral area. We placed a right-angle clamp behind  it, regrasped it with a right-angle clamp. We then divided the sling. We  grasped the left edge and provided traction and divided the sling as  proximally as possible. This was then repeated on the right. We then  palpated over the area. No residual sling could be palpated. We then  freshened up the edge of the site of the extruded, excising the edge of the  vaginal mucosa around the extrusion with Metzenbaum scissors. We then closed  the defect with a running 2-0 Vicryl. Next, the Foley catheter was removed.  We inserted a 22-French cystoscope, and the patient underwent  pancystourethroscopy with 70-degree  lenses with approximately 300 cc of  normal saline. No foreign bodies or sling erosion was noted. Ureteral  orifices were in their normal anatomic position. There no mucosal  abnormalities. Next, the cystoscope was removed. Bladder was drained  with Foley catheter. Vaginal packing was placed. Speculum was removed. The  patient was taken out of lithotomy position where she was reversed from  anesthesia without complication. Dr. Ezzie Dural was present and participated  in all aspects of this case.     ______________________________  Glade Nurse, MD      Lindaann Slough, M.D.  Electronically Signed    MT/MEDQ  D:  11/02/2005  T:  11/02/2005  Job:  [id number]
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
## 2011-05-01 NOTE — Op Note (Signed)
Newton, Shannon                          ACCOUNT NO.:  [id number]   MEDICAL RECORD NO.:  [id number]                   PATIENT TYPE:  AMB   LOCATION:  DAY                                  FACILITY:  [facility]   PHYSICIAN:  Lindaann Slough, M.D.               DATE OF BIRTH:  [birth year]   DATE OF PROCEDURE:  04/27/2003  DATE OF DISCHARGE:                                 OPERATIVE REPORT   PREOPERATIVE DIAGNOSIS:  Stress urinary incontinence.   POSTOPERATIVE DIAGNOSIS:  Stress urinary incontinence.   PROCEDURE:  SPARC procedure and cystoscopy.   SURGEON:  Lindaann Slough, M.D.   ANESTHESIA:  General.   INDICATIONS FOR PROCEDURE:  The patient is a 45 year old female who has been  complaining of frequency, urgency and stress incontinence. The incontinence  is worse with coughing, laughing and sneezing. She was treated with Ditropan  for frequency and urgency but she states that she has not had any  improvement and she wanted to be able to laugh and sneeze without loosing  her urine. She is scheduled today for a SPARC procedure.   DESCRIPTION OF PROCEDURE:  Under general anesthesia, the patient was prepped  and draped and placed in the dorsal lithotomy position. The vaginal mucosa  was infiltrated with 1% lidocaine with epinephrine and then a 2 cm incision  was made about 1 cm from the urethral meatus. The incision was carried down  to the subcutaneous tissues. Then the periurethral space was entered by  dissecting the vaginal mucosa from the periurethral tissues. Then a 1 cm  incision was made in the suprapubic area slightly above the symphysis pubis  on the right side. The same procedure was done on the left side. A Foley  catheter was placed in the bladder and the bladder was drained. The patient  was then placed in Trendelenburg position and then the [facility] needle was  passed through the right suprapubic incision and with the needle hugging  behind the symphysis pubis, the  needle was passed through the vaginal  incision. The same procedure was done on the left side. The Foley catheter  was then removed, the cystoscope was then inserted in the bladder. The  bladder mucosa is normal, there is no stone or tumor in the bladder. The  ureteral orifices are in normal position and shape with clear efflux. There  is no evidence of penetration of the bladder with the [facility] needles. The  cystoscope was then removed. The Foley catheter was then reinserted in the  bladder and the [facility] mesh was attached to the end of the [facility] needles and  brought out through the suprapubic incisions. The end of the mesh with the  needles were then incised and the plastic sheath covering the mesh was  removed. Then a needle holder was passed between the urethra and the mesh  and the mesh was pulled through the suprapubic incision. The  Foley catheter  was then removed and the cystoscope was reinserted in the bladder and there  was no evidence of bladder injury. The is no erosion of the mesh in the  bladder. The cystoscope was then removed. The Foley catheter was reinserted  in the bladder. The wounds were irrigated with bug juice, hemostasis was  then secured with electrocautery and a needle holder was passed between the  urethra and the mesh and the ends of the mesh were incised at the skin level  in the suprapubic area and there was no tension on the mesh. The vaginal  incision was then closed with #2-0 Vicryl and the suprapubic incisions were  closed with 4-0 Monocryl. A vaginal packing was then placed in the vagina.   Sponge, needle and instrument counts were correct on two occasions.   Estimated blood loss minimal.   The patient tolerated the procedure well and left the OR in satisfactory  condition to the post anesthesia care unit.                                               Lindaann Slough, M.D.    MN/MEDQ  D:  04/27/2003  T:  04/27/2003  Job:  [id number]   cc:   Lebron Conners.  Peglo, M.D.

## 2011-05-01 NOTE — Discharge Summary (Signed)
[place]  Patient:    Shannon Newton, Shannon Newton Visit Number: [id number] MRN: [id number]          Service Type: MED Location: [id number] Attending Physician:  Karn Pickler Dictated by:   Mont Dutton, M.D. Admit Date:  04/23/2002 Discharge Date: 04/26/2002                             Discharge Summary  DISCHARGE DIAGNOSES: 1. Group A strep pharyngitis. 2. Hypokalemia, resolved. 3. Abdominal pain/nausea. 4. Urinary incontinence. 5. Allergic rhinitis. 6. Sickle cell trait.  HISTORY OF PRESENT ILLNESS: The patient is a 45 year old previously healthy African-American female presented to the [facility] ER with a three day history of sore throat with progressive abdominal pain, now with vomiting for the past two days and unable to tolerate p.o. fluids. The patient attempted to treat with warm salt water and aspirin with no relief. There was no family members with sore throat but a recent gastroenteritis in the family, as her mother had diarrhea within the past one to two weeks. The patient also had diarrhea, vomiting, and subjective fever and chills. No shortness of breath or chest pain. Did have abdominal pain that was occasionally spasmotic and did have some difficulty swallowing.  PHYSICAL EXAMINATION:  HEENT: She is noted to have erythematous and edematous posterior oropharynx with whitish exudate bilaterally and cobble stone appearance. Did have bilateral lymphadenopathy of the neck.  ABDOMEN: Diffuse tenderness but soft and nondistended. Normal active bowel sounds. Negative psoas sign.  VITAL SIGNS: On admission temperature was 102.4, 99% sat on room air with respiratory rate of 32. She was tachycardic at 151. Blood pressure 126/87.  LABORATORY DATA: UA was negative. Nitrite negative. Beta HCG was negative. Potassium 2.9, lipase 21 and normal. Group A strep screen was positive for group A strep.  HOSPITAL COURSE:  #1  - GROUP A STREP/EXUDATIVE PHARYNGITIS: The patient had a positive group A strep screen. Also with fever, lymphadenopathy, and pharyngitis. Was hospitalized and she had inability to tolerate p.o. fluids. Also had hypokalemia, secondary to decreased p.o. The patient was started on Azithromycin as choice for her since she was Penicillin allergic. She was advanced on her diet slowly as tolerated. IV fluids were given. The patient was controlled with Tylenol and Motrin. By day three of hospitalization, the patient had improved pain control and comfort of the throat and she was using a mouth rinse and p.r.n. pain medications. She had been afebrile for greater than 48 hours and she was able to tolerated p.o. without difficulty. She is stable for discharge to home.  She is discharged on Azithromycin to complete a five day treatment course for her group A strep.  #2 -  HYPOKALEMIA: Treated with KCL on admission and stabilized.  #3 - ABDOMINAL PAIN/NAUSEA/VOMITING: This was thought to be secondary to either a viral gastroenteritis or secondary to pain from her group A strep pharyngitis. She did have a normal lipase and abdominal pain improved during her hospitalization. We did perform a pelvic exam in the hospital, obtaining a GC and chlamydia culture. However, no obvious discharge or gross abnormalities. No cervical motion tenderness on exam. She had some tenderness in her abdomen upon palpation but no adnexal masses or other worrisome pelvic tenderness on exam. Her PCP to follow her GC and Chlamydia cultures. The patient was instructed to let her PCP know this on discharge. She agreed with the plan  and expressed understanding.  #4 - URINARY INCONTINENCE: The patient with history of urinary incontinence for the past year. Was followed by her PCP who continued her on Ditropan. No changes during hospitalization.  #5 - ALLERGIC RHINITIS: Continued her outpatient regimen of Zyrtec. No  changes during hospitalization.  DISPOSITION: The patient is stable for discharge to home on Apr 26, 2002 on the following medications.  DISCHARGE MEDICATIONS: 1. Azithromycin 250 mg tab p.o. q.d. times one day to complete a five day    course treatment. 2. Ditropan 10 mg tab p.o. b.i.d. 3. Zyrtec 10 mg tab p.o. q.d. 4. Phenergan 12.5 mg tab q.6h. p.r.n. nausea.  ACTIVITY: No restrictions.  DIET: The patient was encouraged to drink liquids and advanced to soft foods and a regular diet slowly and as tolerated.  FOLLOW-UP/SPECIAL INSTRUCTIONS: The patient was instructed to call her doctor or return to the ER if the pain worsened or if she was unable to eat. She is instructed to follow-up with her PCP Dr. Thurmond Butts if she is not feeling better in the next five to seven days. She was also instructed to see him regardless within the next one to two weeks for hospital follow-up appointment. Once again, she was instructed to let her doctor know that there were two tests that are still not back when she left. to look for infections in her cervix, which was a GC and Chlamydia swabs.  DISCHARGE CONDITION: The patient was stable for discharge on Apr 26, 2002. Dictated by:   Mont Dutton, M.D. Attending Physician:  Karn Pickler DD:  06/14/02 TD:  06/18/02 Job: [id number]

## 2011-05-01 NOTE — Op Note (Signed)
Shannon Newton, Shannon Newton                ACCOUNT NO.:  [id number]   MEDICAL RECORD NO.:  [id number]          PATIENT TYPE:  AMB   LOCATION:  DAY                          FACILITY:  [facility]   PHYSICIAN:  Glade Nurse, MD     DATE OF BIRTH:  1966-06-10   DATE OF PROCEDURE:  DATE OF DISCHARGE:                                 OPERATIVE REPORT   Audio too short to transcribe (less than 5 seconds)           ______________________________  Glade Nurse, MD     MT/MEDQ  D:  10/16/2005  T:  10/16/2005  Job:  [id number]

## 2011-07-17 ENCOUNTER — Ambulatory Visit: Payer: Self-pay | Admitting: Physical Medicine & Rehabilitation

## 2011-07-31 ENCOUNTER — Encounter: Payer: Self-pay | Attending: Physical Medicine & Rehabilitation

## 2011-07-31 ENCOUNTER — Ambulatory Visit: Payer: Self-pay | Admitting: Physical Medicine & Rehabilitation

## 2011-09-04 LAB — URINALYSIS, MICROSCOPIC ONLY
Glucose, UA: NEGATIVE
Leukocytes, UA: NEGATIVE
Protein, ur: NEGATIVE
Specific Gravity, Urine: 1.027
pH: 7

## 2011-09-04 LAB — COMPREHENSIVE METABOLIC PANEL
ALT: 16
AST: 21
Albumin: 4
CO2: 27
Chloride: 108
Creatinine, Ser: 0.67
GFR calc Af Amer: >60
GFR calc non Af Amer: >60
Sodium: 139
Total Bilirubin: 0.6

## 2011-09-04 LAB — BASIC METABOLIC PANEL
CO2: 27
Chloride: 105
Creatinine, Ser: 0.74
GFR calc Af Amer: >60

## 2011-09-04 LAB — CBC
Hemoglobin: 11.9 — ABNORMAL LOW
MCHC: 34.2
MCV: 72.3 — ABNORMAL LOW
MCV: 73.1 — ABNORMAL LOW
Platelets: 228
RBC: 4.8
RBC: 5.47 — ABNORMAL HIGH
WBC: 5.7

## 2011-09-04 LAB — DIFFERENTIAL
Basophils Absolute: 0
Basophils Relative: 0
Eosinophils Absolute: 0.1
Eosinophils Absolute: 0.1
Eosinophils Relative: 2
Eosinophils Relative: 2
Lymphocytes Relative: 42
Lymphs Abs: 2.4
Monocytes Absolute: 0.5
Monocytes Absolute: 0.6
Monocytes Relative: 12

## 2011-09-07 LAB — CBC
HCT: 35.4 — ABNORMAL LOW
MCV: 71.4 — ABNORMAL LOW
RBC: 4.96
WBC: 5.4

## 2011-09-28 LAB — DIFFERENTIAL
Basophils Absolute: 0.1
Basophils Relative: 1
Eosinophils Absolute: 0
Eosinophils Relative: 0
Lymphocytes Relative: 16
Lymphs Abs: 1.9
Monocytes Absolute: 1.3 — ABNORMAL HIGH
Monocytes Relative: 10
Neutro Abs: 8.9 — ABNORMAL HIGH
Neutrophils Relative %: 73

## 2011-09-28 LAB — I-STAT 8, (EC8 V) (CONVERTED LAB)
Acid-Base Excess: 1
Chloride: 104
HCT: 44
Operator id: 247071
TCO2: 28
pCO2, Ven: 43 — ABNORMAL LOW

## 2011-09-28 LAB — TSH: TSH: 1.799

## 2011-09-28 LAB — POCT RAPID STREP A: Streptococcus, Group A Screen (Direct): NEGATIVE

## 2011-09-28 LAB — CBC
HCT: 40
Hemoglobin: 13.5
MCHC: 33.8
MCV: 72.2 — ABNORMAL LOW
Platelets: 282
RBC: 5.54 — ABNORMAL HIGH
RDW: 14.3 — ABNORMAL HIGH
WBC: 12.2 — ABNORMAL HIGH

## 2011-09-28 LAB — PHOSPHORUS: Phosphorus: 2.9

## 2011-09-28 LAB — CALCIUM: Calcium: 9.3

## 2011-09-28 LAB — POCT I-STAT CREATININE: Operator id: 247071

## 2011-09-28 LAB — T4, FREE: Free T4: 0.97

## 2012-03-07 ENCOUNTER — Other Ambulatory Visit: Payer: Self-pay | Admitting: Internal Medicine

## 2012-03-08 NOTE — Telephone Encounter (Signed)
Patient has not been seen since 11/2010; please find out if she has changed primary care providers.

## 2012-03-09 NOTE — Telephone Encounter (Signed)
i have called all 3 ph#'s listed for pt, work #- she does not work there any more, home # was someone else's # now, 3rd # i left a message for a return call.

## 2012-03-09 NOTE — Telephone Encounter (Signed)
Long overdue for visit and labs.  Will refill for 1 month only.  Please call her with an app't.

## 2012-05-13 ENCOUNTER — Other Ambulatory Visit: Payer: Self-pay | Admitting: Internal Medicine

## 2012-05-13 NOTE — Telephone Encounter (Signed)
Patient has not been seen here since 11/2010.  She needs an appointment for [place] to refill her medications.

## 2012-05-13 NOTE — Telephone Encounter (Signed)
Not our patient

## 2012-05-14 ENCOUNTER — Other Ambulatory Visit: Payer: Self-pay | Admitting: Internal Medicine

## 2012-05-16 NOTE — Telephone Encounter (Signed)
Pt was called, not at home; the person who answered the telephone stated she will inform pt to call the clinic. Pt needs an appt for refills if she still want to be seen here at [facility].

## 2012-05-17 NOTE — Telephone Encounter (Signed)
Not IM patient 

## 2012-05-18 ENCOUNTER — Other Ambulatory Visit: Payer: Self-pay | Admitting: Internal Medicine

## 2012-05-25 ENCOUNTER — Telehealth: Payer: Self-pay | Admitting: *Deleted

## 2012-05-25 NOTE — Telephone Encounter (Signed)
Pt called wants refill on thyroid med - last seen in clinic 2011. Has been out of thyroid med for some time. Talked with [name] - appt made 06/14/12 8:15AM Dr Clyde Lundborg. Pt aware of appt. [name] RN 05/25/12 10:30AM

## 2012-06-14 ENCOUNTER — Encounter: Payer: Self-pay | Admitting: Internal Medicine

## 2012-06-14 ENCOUNTER — Ambulatory Visit (INDEPENDENT_AMBULATORY_CARE_PROVIDER_SITE_OTHER): Payer: Self-pay | Admitting: Internal Medicine

## 2012-06-14 VITALS — BP 132/94 | HR 68 | Temp 97.7°F | Ht 61.5 in | Wt 216.9 lb

## 2012-06-14 DIAGNOSIS — R32 Unspecified urinary incontinence: Secondary | ICD-10-CM

## 2012-06-14 DIAGNOSIS — I10 Essential (primary) hypertension: Secondary | ICD-10-CM

## 2012-06-14 DIAGNOSIS — M1990 Unspecified osteoarthritis, unspecified site: Secondary | ICD-10-CM

## 2012-06-14 DIAGNOSIS — E039 Hypothyroidism, unspecified: Secondary | ICD-10-CM

## 2012-06-14 LAB — COMPREHENSIVE METABOLIC PANEL
ALT: 17 U/L (ref 0–35)
Albumin: 4.2 g/dL (ref 3.5–5.2)
BUN: 10 mg/dL (ref 6–23)
CO2: 24 mEq/L (ref 19–32)
Calcium: 9.3 mg/dL (ref 8.4–10.5)
Chloride: 107 mEq/L (ref 96–112)
Creat: 0.69 mg/dL (ref 0.50–1.10)
Glucose, Bld: 103 mg/dL — ABNORMAL HIGH (ref 70–99)
Potassium: 4.2 mEq/L (ref 3.5–5.3)
Sodium: 139 mEq/L (ref 135–145)

## 2012-06-14 LAB — T4, FREE: Free T4: 0.74 ng/dL — ABNORMAL LOW (ref 0.80–1.80)

## 2012-06-14 LAB — TSH: TSH: 4.197 u[IU]/mL (ref 0.350–4.500)

## 2012-06-14 MED ORDER — ACETAMINOPHEN 325 MG PO TABS: 650.0000 mg | ORAL_TABLET | Freq: Four times a day (QID) | ORAL | Status: DC | PRN

## 2012-06-14 MED ORDER — SOLIFENACIN SUCCINATE 10 MG PO TABS: 10.0000 mg | ORAL_TABLET | Freq: Every day | ORAL | Status: DC

## 2012-06-14 MED ORDER — HYDROXYZINE HCL 25 MG PO TABS: 25.0000 mg | ORAL_TABLET | Freq: Four times a day (QID) | ORAL | Status: DC | PRN

## 2012-06-14 MED ORDER — LEVOTHYROXINE SODIUM 88 MCG PO TABS: 88.0000 ug | ORAL_TABLET | Freq: Every day | ORAL | Status: DC

## 2012-06-14 NOTE — Progress Notes (Signed)
Patient ID: Shannon Newton, female   DOB: Jul 28, 1966, 46 y.o.   MRN:    Subjective:   Patient ID: Shannon Newton female   DOB: 1965-12-24 46 y.o.   MRN: [id number]  HPI: [name] is a 46 y.o. past medical history as outlined below, who presents with a followup visit  She reports that she stopped taking all her medications for 2 months because of no refill.  -Regarding hypothyroidism,  She feels that her hypothyroid problem comes back. She has a dry skin, dry hair, feeling cold all the times and is constipated. -Regarding osteoarthritis,  she has mild to moderate pain over left ankle. -Regarding hypertension, she is not taking any medications for more than 2 months. She does not have chest pain, shortness of breath, palpitation or fluid retention in legs. -Regarding her urinary incontinence, it was well controlled with  Vesicare before. Since she ran out of her medication,  this problem is getting worse. She wants to get a refill of her VESIcare.   Past Medical History  Diagnosis Date  . Depression   . Hypertension   . Abdominal pain, recurrent 2009    per CT of the abd/pelvis (01/26/2008) -  Diffuse inflammatory change of the descending and rectosigmoid colon.  Additional inflammatory changes within the terminal ileum raise concern for inflammatory bowel disease (Crohn's disease).  . Hypothyroidism 2003    Thyroid US done in 2003 (results in [place]) - showed increased 24 hour uptake and the findings were consistent with Grave's disease; patient is s/p RAI therapy (09/19/2002)  . Back pain, chronic 2001    per MRI in 2001 - disc protrusion L5-6, to the right   Current Outpatient Prescriptions  Medication Sig Dispense Refill  . hydrOXYzine (ATARAX) 25 MG tablet Take 25 mg by mouth every 6 (six) hours as needed.        Marland Kitchen levothyroxine (SYNTHROID, LEVOTHROID) 88 MCG tablet TAKE ONE TABLET BY MOUTH EVERY DAY  31 tablet  0  . lisinopril-hydrochlorothiazide (PRINZIDE,ZESTORETIC) 20-25  MG per tablet Take 1 tablet by mouth daily.        Marland Kitchen loperamide (LOPERAMIDE A-D) 2 MG tablet Take 2 mg by mouth 4 (four) times daily as needed.        . loratadine (CLARITIN) 10 MG tablet Take 10 mg by mouth daily.        . solifenacin (VESICARE) 10 MG tablet Take 10 mg by mouth daily.        . traMADol (ULTRAM) 50 MG tablet Take 50 mg by mouth 2 (two) times daily as needed.         Family History  Problem Relation Age of Onset  . Stroke Neg Hx   . Cancer Neg Hx    History   Social History  . Marital Status: Single    Spouse Name: N/A    Number of Children: N/A  . Years of Education: N/A   Social History Main Topics  . Smoking status: Never Smoker   . Smokeless tobacco: None  . Alcohol Use: None  . Drug Use: No  . Sexually Active: None   Other Topics Concern  . None   Social History Narrative  . None   Review of Systems:  General: no fevers, chills, no changes in body weight, no changes in appetite. Skin: no rash. Has dry skin and dry hairs  HEENT: no blurry vision, hearing changes or sore throat. Has a dry hairs.  Pulm: no dyspnea, coughing,  wheezing CV: no chest pain, palpitations, shortness of breath Abd: no nausea/vomiting, abdominal pain, has constipation. GU: no dysuria, hematuria. Has urinary incontinence.  Ext: left ankle pain. Neuro: no weakness, numbness, or tingling   Objective:  Physical Exam: Filed Vitals:   06/14/12 0832  BP: 132/94  Pulse: 68  Temp: 97.7 F (36.5 C)  TempSrc: Oral  Height: 5' 1.5" (1.562 m)  Weight: 216 lb 14.4 oz (98.385 kg)   General: resting in bed, not in acute distress HEENT: PERRL, EOMI, no scleral icterus.  Cardiac: S1/S2, RRR, No murmurs, gallops or rubs Pulm: Good air movement bilaterally, Clear to auscultation bilaterally, No rales, wheezing, rhonchi or rubs. Abd: Soft,  nondistended, nontender, no rebound pain, no organomegaly, BS present Ext: No rashes or edema, 2+DP/PT pulse bilaterally. There is a tenderness  over her left ankle joint. No redness or warmth over left ankle joint. There is a mild effusion over the lateral malleolus area. Musculoskeletal: No joint deformities, erythema, or stiffness, ROM full and no nontender Skin: no rashes. Skin is dry. Neuro: alert and oriented X3, cranial nerves II-XII grossly intact, muscle strength 5/5 in all extremeties,  sensation to light touch intact.  Psych.: patient is not psychotic, no suicidal or hemocidal ideation.   Assessment & Plan:   Hypothyroidism: After stopped her medications for 2 months, patient developed symptoms for hypothyroidism. Will start her Synthroid at 88 mcg daily. We'll check her TSH and free T4 today. Will recheck her CMP today. Will recheck her TSH and a T4 in 6 weeks.  Hypertension: Her blood pressure is sightly above goal. Today blood pressure is 132/94. Will start her Prinzide today. Will check BMP In 2 weeks to assure that her kidney function is okay. Osteoarthritis: The patient used to take tramadol. Today her left ankle pain is mild. She agrees to try Tylenol first. Will give her prescription for Tylenol.  Urinary incontinence: Will restart her Vesicare and follow up. Health maintenance: Patient would like to postpone her Pap smear.  Lorretta Harp, MD PGY1, Internal Medicine Teaching Service Pager: [phone]

## 2012-06-14 NOTE — Patient Instructions (Signed)
1. Please come back to check your kidney function in 2 weeks. I started your Prinzide I need to monitor your kidney function in 2 weeks. 2. Please take all medications as prescribed.  3. If you have worsening of your symptoms or new symptoms arise, please call the clinic ([phone]), or go to the ER immediately if symptoms are severe.

## 2012-09-14 ENCOUNTER — Encounter (HOSPITAL_COMMUNITY): Payer: Self-pay | Admitting: *Deleted

## 2012-09-14 ENCOUNTER — Emergency Department (HOSPITAL_COMMUNITY)
Admission: EM | Admit: 2012-09-14 | Discharge: 2012-09-14 | Disposition: A | Discharge status: Home / Self Care | Payer: Self-pay | Attending: Emergency Medicine | Admitting: Emergency Medicine

## 2012-09-14 ENCOUNTER — Telehealth: Payer: Self-pay | Admitting: *Deleted

## 2012-09-14 DIAGNOSIS — Z885 Allergy status to narcotic agent status: Secondary | ICD-10-CM | POA: Insufficient documentation

## 2012-09-14 DIAGNOSIS — Z880 Allergy status to penicillin: Secondary | ICD-10-CM | POA: Insufficient documentation

## 2012-09-14 DIAGNOSIS — F3289 Other specified depressive episodes: Secondary | ICD-10-CM | POA: Insufficient documentation

## 2012-09-14 DIAGNOSIS — J988 Other specified respiratory disorders: Secondary | ICD-10-CM

## 2012-09-14 DIAGNOSIS — J069 Acute upper respiratory infection, unspecified: Secondary | ICD-10-CM | POA: Insufficient documentation

## 2012-09-14 DIAGNOSIS — Z886 Allergy status to analgesic agent status: Secondary | ICD-10-CM | POA: Insufficient documentation

## 2012-09-14 DIAGNOSIS — E039 Hypothyroidism, unspecified: Secondary | ICD-10-CM | POA: Insufficient documentation

## 2012-09-14 DIAGNOSIS — F329 Major depressive disorder, single episode, unspecified: Secondary | ICD-10-CM | POA: Insufficient documentation

## 2012-09-14 DIAGNOSIS — I10 Essential (primary) hypertension: Secondary | ICD-10-CM | POA: Insufficient documentation

## 2012-09-14 DIAGNOSIS — Z79899 Other long term (current) drug therapy: Secondary | ICD-10-CM | POA: Insufficient documentation

## 2012-09-14 DIAGNOSIS — M549 Dorsalgia, unspecified: Secondary | ICD-10-CM | POA: Insufficient documentation

## 2012-09-14 LAB — RAPID STREP SCREEN (MED CTR MEBANE ONLY): Streptococcus, Group A Screen (Direct): NEGATIVE

## 2012-09-14 MED ORDER — ALBUTEROL SULFATE HFA 108 (90 BASE) MCG/ACT IN AERS
2.0000 | INHALATION_SPRAY | RESPIRATORY_TRACT | Status: DC | PRN
  Administered 2012-09-14: 2 via RESPIRATORY_TRACT
  Filled 2012-09-14: qty 6.7

## 2012-09-14 MED ORDER — AZITHROMYCIN 250 MG PO TABS: 250.0000 mg | ORAL_TABLET | Freq: Every day | ORAL | Status: DC

## 2012-09-14 MED ORDER — BENZONATATE 100 MG PO CAPS: 100.0000 mg | ORAL_CAPSULE | Freq: Two times a day (BID) | ORAL | Status: DC | PRN

## 2012-09-14 NOTE — ED Provider Notes (Signed)
History     CSN: [id number]  Arrival date & time 09/14/12  7829   First MD Initiated Contact with Patient 09/14/12 2145      Chief Complaint  Patient presents with  . Sore Throat    (Consider location/radiation/quality/duration/timing/severity/associated sxs/prior treatment) HPI Comments: Patient reports she has had cough, sore throat, nasal congestion, ear pain, chills, and myalgias x 2 weeks.  States she is coughing up clear/white sputum with bloody flecks.  The cough is keeping her up at night.  Occasional SOB.  States she has been taking theraflu, tylenol, and drinking hot tea without improvement.  Has been exposed to several grandchildren with similar symptoms.  Pt states that after 2 full weeks she feels that she is still getting worse.    Patient is a 46 y.o. female presenting with pharyngitis. The history is provided by the patient.  Sore Throat Associated symptoms include chills, congestion, coughing, myalgias and a sore throat. Pertinent negatives include no abdominal pain, chest pain, nausea or vomiting.    Past Medical History  Diagnosis Date  . Depression   . Hypertension   . Abdominal pain, recurrent 2009    per CT of the abd/pelvis (01/26/2008) -  Diffuse inflammatory change of the descending and rectosigmoid colon.  Additional inflammatory changes within the terminal ileum raise concern for inflammatory bowel disease (Crohn's disease).  . Hypothyroidism 2003    Thyroid US done in 2003 (results in [place]) - showed increased 24 hour uptake and the findings were consistent with Grave's disease; patient is s/p RAI therapy (09/19/2002)  . Back pain, chronic 2001    per MRI in 2001 - disc protrusion L5-6, to the right    Past Surgical History  Procedure Date  . Tubal ligation   . Cystocele repair 2004    done by Dr. Su Grand    Family History  Problem Relation Age of Onset  . Stroke Neg Hx   . Cancer Neg Hx     History  Substance Use Topics  . Smoking  status: Never Smoker   . Smokeless tobacco: Not on file  . Alcohol Use: No    OB History    Grav Para Term Preterm Abortions TAB SAB Ect Mult Living                  Review of Systems  Constitutional: Positive for chills.  HENT: Positive for ear pain, congestion, sore throat, rhinorrhea and sinus pressure. Negative for trouble swallowing.   Respiratory: Positive for cough and shortness of breath.   Cardiovascular: Negative for chest pain.  Gastrointestinal: Positive for diarrhea. Negative for nausea, vomiting and abdominal pain.  Musculoskeletal: Positive for myalgias.    Allergies  Aspirin; Codeine; Hydrocodone; and Penicillins  Home Medications   Current Outpatient Rx  Name Route Sig Dispense Refill  . VITAMIN D 1000 UNITS PO TABS Oral Take 1,000 Units by mouth daily.    Marland Kitchen LEVOTHYROXINE SODIUM 88 MCG PO TABS Oral Take 1 tablet (88 mcg total) by mouth daily. 31 tablet 5  . LORATADINE 10 MG PO TABS Oral Take 10 mg by mouth daily.      . TRAMADOL HCL 50 MG PO TABS Oral Take 50 mg by mouth 2 (two) times daily as needed. Pain      BP 131/88  Pulse 78  Temp 98.6 F (37 C) (Oral)  SpO2 100%  Physical Exam  Nursing note and vitals reviewed. Constitutional: She appears well-developed and well-nourished. No distress.  HENT:  Head: Normocephalic and atraumatic.  Right Ear: Tympanic membrane normal.  Left Ear: Tympanic membrane normal.  Nose: Right sinus exhibits no maxillary sinus tenderness and no frontal sinus tenderness. Left sinus exhibits maxillary sinus tenderness and frontal sinus tenderness.  Mouth/Throat: Uvula is midline and oropharynx is clear and moist. Mucous membranes are not dry. No uvula swelling. No oropharyngeal exudate, posterior oropharyngeal edema, posterior oropharyngeal erythema or tonsillar abscesses.  Eyes: Conjunctivae normal are normal.  Neck: Neck supple.  Cardiovascular: Normal rate and regular rhythm.   Pulmonary/Chest: Effort normal and breath  sounds normal. No stridor. No respiratory distress. She has no wheezes. She has no rales. She exhibits no tenderness.  Abdominal: Soft. She exhibits no distension. There is generalized tenderness. There is no rebound and no guarding.  Neurological: She is alert.  Skin: She is not diaphoretic.    ED Course  Procedures (including critical care time)   Labs Reviewed  RAPID STREP SCREEN   No results found.  Filed Vitals:   09/14/12 2245  BP: 138/87  Pulse: 78  Temp:      1. Respiratory infection     MDM  Pt with two weeks of respiratory symptoms, reports they are getting worse.  Temperature apparently not taken in ED.  She is having myalgias and chills.    Lungs CTAB, pharynx unremarkable.  Pt does have left sinus tenderness.  Symptoms appear to be viral and pt does have sick contacts with similar symptoms.  However, given duration of illness and worsening of symptoms I suspect there is a bacterial component at this point and I will give her a prescription for a z-pak.  Pt has PCP follow up scheduled for tomorrow.  Discussed all results with patient.  Pt given return precautions.  Pt verbalizes understanding and agrees with plan.           [place], [place] 09/15/12 [phone]

## 2012-09-14 NOTE — ED Notes (Signed)
Pt reports sore throat x 2 weeks. Pt took theraflu, cold wheeze, throat lozenges, and tylenol (last dose yesterday).

## 2012-09-14 NOTE — Telephone Encounter (Signed)
Agree, thanks. Have her seen in clinic tomorrow morning.

## 2012-09-14 NOTE — Telephone Encounter (Signed)
Pt called in with c/o productive cough, feeling bad all over, sore throat and chills. Onset 2 weeks ago.  She thought she had the flu but is getting worse. Has tried OTC cold meds, increase fluids.   Eating and drinking okay. Denies fever.  Scheduled for tomorrow at 8:30, asked pt to go to ED if she gets worse or has SOB

## 2012-09-14 NOTE — ED Notes (Signed)
Pt reports fever, neck swelling.

## 2012-09-14 NOTE — ED Notes (Signed)
Pt c/o possible strep throat; history of same; fever; swelling x 2 wks; hoarse

## 2012-09-15 ENCOUNTER — Ambulatory Visit: Payer: Self-pay

## 2012-09-16 NOTE — ED Provider Notes (Signed)
Medical screening examination/treatment/procedure(s) were performed by non-physician practitioner and as supervising physician I was immediately available for consultation/collaboration.   Hurman Horn, MD 09/16/12 [phone]

## 2013-04-20 ENCOUNTER — Other Ambulatory Visit: Payer: Self-pay | Admitting: Internal Medicine

## 2013-04-20 DIAGNOSIS — E039 Hypothyroidism, unspecified: Secondary | ICD-10-CM

## 2013-07-19 ENCOUNTER — Ambulatory Visit (INDEPENDENT_AMBULATORY_CARE_PROVIDER_SITE_OTHER): Payer: Self-pay | Admitting: Internal Medicine

## 2013-07-19 ENCOUNTER — Encounter: Payer: Self-pay | Admitting: Internal Medicine

## 2013-07-19 VITALS — BP 121/86 | HR 79 | Temp 97.5°F | Ht 61.5 in | Wt 221.5 lb

## 2013-07-19 DIAGNOSIS — E039 Hypothyroidism, unspecified: Secondary | ICD-10-CM

## 2013-07-19 DIAGNOSIS — G4733 Obstructive sleep apnea (adult) (pediatric): Secondary | ICD-10-CM

## 2013-07-19 DIAGNOSIS — E669 Obesity, unspecified: Secondary | ICD-10-CM

## 2013-07-19 DIAGNOSIS — M1990 Unspecified osteoarthritis, unspecified site: Secondary | ICD-10-CM

## 2013-07-19 DIAGNOSIS — F329 Major depressive disorder, single episode, unspecified: Secondary | ICD-10-CM

## 2013-07-19 MED ORDER — LEVOTHYROXINE SODIUM 88 MCG PO TABS: 88.0000 ug | ORAL_TABLET | Freq: Every day | ORAL | Status: DC

## 2013-07-19 NOTE — Patient Instructions (Signed)
Please start taking your Synthroid, and I encourage you to be compliant with this medication as most of your symptoms are related to your thyroid issues. Please turn in your application for the orange card to [facility]. Please come back in 6 weeks, so that we can discuss further regarding your symptoms. I will refer you to the sleep study test after having you orange card. Please continue doing regular exercises and keeping her for dietary for your weight loss.   Hypothyroidism The thyroid is a large gland located in the lower front of your neck. The thyroid gland helps control metabolism. Metabolism is how your body handles food. It controls metabolism with the hormone thyroxine. When this gland is underactive (hypothyroid), it produces too little hormone.  CAUSES These include:   Absence or destruction of thyroid tissue.  Goiter due to iodine deficiency.  Goiter due to medications.  Congenital defects (since birth).  Problems with the pituitary. This causes a lack of TSH (thyroid stimulating hormone). This hormone tells the thyroid to turn out more hormone. SYMPTOMS  Lethargy (feeling as though you have no energy)  Cold intolerance  Weight gain (in spite of normal food intake)  Dry skin  Coarse hair  Menstrual irregularity (if severe, may lead to infertility)  Slowing of thought processes Cardiac problems are also caused by insufficient amounts of thyroid hormone. Hypothyroidism in the newborn is cretinism, and is an extreme form. It is important that this form be treated adequately and immediately or it will lead rapidly to retarded physical and mental development. DIAGNOSIS  To prove hypothyroidism, your caregiver may do blood tests and ultrasound tests. Sometimes the signs are hidden. It may be necessary for your caregiver to watch this illness with blood tests either before or after diagnosis and treatment. TREATMENT  Low levels of thyroid hormone are increased by using  synthetic thyroid hormone. This is a safe, effective treatment. It usually takes about four weeks to gain the full effects of the medication. After you have the full effect of the medication, it will generally take another four weeks for problems to leave. Your caregiver may start you on low doses. If you have had heart problems the dose may be gradually increased. It is generally not an emergency to get rapidly to normal. HOME CARE INSTRUCTIONS   Take your medications as your caregiver suggests. Let your caregiver know of any medications you are taking or start taking. Your caregiver will help you with dosage schedules.  As your condition improves, your dosage needs may increase. It will be necessary to have continuing blood tests as suggested by your caregiver.  Report all suspected medication side effects to your caregiver. SEEK MEDICAL CARE IF: Seek medical care if you develop:  Sweating.  Tremulousness (tremors).  Anxiety.  Rapid weight loss.  Heat intolerance.  Emotional swings.  Diarrhea.  Weakness. SEEK IMMEDIATE MEDICAL CARE IF:  You develop chest pain, an irregular heart beat (palpitations), or a rapid heart beat. MAKE SURE YOU:   Understand these instructions.  Will watch your condition.  Will get help right away if you are not doing well or get worse. Document Released: 11/30/2005 Document Revised: 02/22/2012 Document Reviewed: 07/20/2008 [facility] Patient Information 2014 [place], [place].

## 2013-07-19 NOTE — Progress Notes (Signed)
Patient ID: Shannon Newton, female   DOB: [birth year], 47 y.o.   MRN: [id number]          HPI: [name] is a 47 y.o. with past medical history of hypertension, osteoarthritis, obesity, obstructive sleep apnea, and depression, presents to the clinic after one year for routine followup. Patient has multiple complaints including loss of energy, low sleep, pain in her hands which is on and off, and intermittent depression symptoms. She admits to being off her Synthroid for about 6 months. Patient has not followed up in our clinic for more than a year. During her last visit July 2013, she was restarted on Synthroid (she had been off for several months) and was instructed to return in 6 weeks for f/u. TSH was high normal and her free T4 was found to be low at that time. She reports that whenever she gets off her Synthroid. she gets these similar symptoms, like the ones she has today. She does not have insurance, which makes it difficult for her to come to the clinic and for her medications. She is has started the process of applying for orange card, and she's been given the paperwork required to remain after completion on this visit. She has an appointment with Rudell Cobb.  She does not take any other medications. She asks to be referred to an endocrinologist.  Past Medical History  Diagnosis Date  . Depression   . Hypertension   . Abdominal pain, recurrent 2009    per CT of the abd/pelvis (01/26/2008) -  Diffuse inflammatory change of the descending and rectosigmoid colon.  Additional inflammatory changes within the terminal ileum raise concern for inflammatory bowel disease (Crohn's disease).  . Hypothyroidism 2003    Thyroid US done in 2003 (results in [place]) - showed increased 24 hour uptake and the findings were consistent with Grave's disease; patient is s/p RAI therapy (09/19/2002)  . Back pain, chronic 2001    per MRI in 2001 - disc protrusion L5-6, to the right   Current Outpatient  Prescriptions  Medication Sig Dispense Refill  . cholecalciferol (VITAMIN D) 1000 UNITS tablet Take 1,000 Units by mouth daily.      Marland Kitchen levothyroxine (SYNTHROID, LEVOTHROID) 88 MCG tablet Take 1 tablet (88 mcg total) by mouth daily before breakfast.  30 tablet  2   No current facility-administered medications for this visit.   Family History  Problem Relation Age of Onset  . Stroke Neg Hx   . Cancer Neg Hx    History   Social History  . Marital Status: Single    Spouse Name: N/A    Number of Children: N/A  . Years of Education: N/A   Social History Main Topics  . Smoking status: Never Smoker   . Smokeless tobacco: None  . Alcohol Use: No  . Drug Use: No  . Sexually Active: None   Other Topics Concern  . None   Social History Narrative  . None    Review of Systems: Constitutional: Denies fever, chills, diaphoresis, appetite change and fatigue. She report weight gain.  Respiratory: Denies SOB, DOE, cough, chest tightness, and wheezing.  Cardiovascular: No chest pain, palpitations and leg swelling.  Gastrointestinal: No abdominal pain, nausea, vomiting, bloody stools Genitourinary: No dysuria, frequency, hematuria, or flank pain.    Objective:  Physical Exam: Filed Vitals:   07/19/13 1546  BP: 121/86  Pulse: 79  Temp: 97.5 F (36.4 C)  TempSrc: Oral  Height: 5' 1.5" (1.562 m)  Weight: 221 lb 8 oz (100.472 kg)  SpO2: 99%   General: Well nourished. No acute distress.  Lungs: CTA bilaterally. Heart: RRR; no extra sounds or murmurs  Abdomen: Non-distended, normal BS, soft, nontender; no hepatosplenomegaly  Extremities: No pedal edema. No joint swelling or tenderness. Neurologic: Alert and oriented x3. No obvious neurologic deficits.  Assessment & Plan:  I have discussed my assessment and plan  with Dr. Criselda Peaches as detailed under problem based charting.

## 2013-07-20 NOTE — Assessment & Plan Note (Signed)
Most of her current symptoms seem to be related to hypothyroidism, and unlikely to improve when she resumes some treatment. Restarted the patient on  her usual dose of Synthroid of 88 mcg daily. I have emphasized to her to followup in 6-8 weeks in order to adjust her medications if needed. Will check a TSH, and free T4 then. I have discussed that there is no reason for an endocrinologist referral at this time since she is noncompliant with her medications. However, I reassured her that if there is need for referral to a specialist, I will be happy to do that. She is in the process of getting her orange card, which hopefully will mitigate, the cost issues.

## 2013-07-20 NOTE — Assessment & Plan Note (Signed)
She reports history of snoring and daytime sleepiness. She is obese. I will refer her for a sleep study after she gets orange card.

## 2013-07-28 NOTE — Progress Notes (Signed)
Case discussed with Dr. Zada Girt at the time of the visit.  We reviewed the resident's history and exam and pertinent patient test results.  I agree with the assessment, diagnosis, and plan of care documented in the resident's note.  Please note the correction - - her symptoms are *likely to improve when she resumes some treatment.

## 2013-08-01 ENCOUNTER — Ambulatory Visit: Payer: Self-pay

## 2013-12-20 ENCOUNTER — Other Ambulatory Visit: Payer: Self-pay | Admitting: Internal Medicine

## 2014-02-19 ENCOUNTER — Encounter: Payer: Self-pay | Admitting: Internal Medicine

## 2014-02-28 ENCOUNTER — Encounter: Payer: Self-pay | Admitting: Internal Medicine

## 2014-06-13 ENCOUNTER — Telehealth: Payer: Self-pay | Admitting: *Deleted

## 2014-06-13 ENCOUNTER — Emergency Department (HOSPITAL_COMMUNITY): Payer: Self-pay

## 2014-06-13 ENCOUNTER — Encounter (HOSPITAL_COMMUNITY): Payer: Self-pay | Admitting: Emergency Medicine

## 2014-06-13 ENCOUNTER — Emergency Department (HOSPITAL_COMMUNITY)
Admission: EM | Admit: 2014-06-13 | Discharge: 2014-06-13 | Disposition: A | Discharge status: Home / Self Care | Payer: Self-pay | Attending: Emergency Medicine | Admitting: Emergency Medicine

## 2014-06-13 DIAGNOSIS — M25511 Pain in right shoulder: Secondary | ICD-10-CM

## 2014-06-13 DIAGNOSIS — E039 Hypothyroidism, unspecified: Secondary | ICD-10-CM | POA: Insufficient documentation

## 2014-06-13 DIAGNOSIS — S59919A Unspecified injury of unspecified forearm, initial encounter: Secondary | ICD-10-CM

## 2014-06-13 DIAGNOSIS — S6990XA Unspecified injury of unspecified wrist, hand and finger(s), initial encounter: Secondary | ICD-10-CM

## 2014-06-13 DIAGNOSIS — X500XXA Overexertion from strenuous movement or load, initial encounter: Secondary | ICD-10-CM | POA: Insufficient documentation

## 2014-06-13 DIAGNOSIS — S46909A Unspecified injury of unspecified muscle, fascia and tendon at shoulder and upper arm level, unspecified arm, initial encounter: Secondary | ICD-10-CM | POA: Insufficient documentation

## 2014-06-13 DIAGNOSIS — Z880 Allergy status to penicillin: Secondary | ICD-10-CM | POA: Insufficient documentation

## 2014-06-13 DIAGNOSIS — Y9389 Activity, other specified: Secondary | ICD-10-CM | POA: Insufficient documentation

## 2014-06-13 DIAGNOSIS — Z8659 Personal history of other mental and behavioral disorders: Secondary | ICD-10-CM | POA: Insufficient documentation

## 2014-06-13 DIAGNOSIS — S4980XA Other specified injuries of shoulder and upper arm, unspecified arm, initial encounter: Secondary | ICD-10-CM | POA: Insufficient documentation

## 2014-06-13 DIAGNOSIS — Y9289 Other specified places as the place of occurrence of the external cause: Secondary | ICD-10-CM | POA: Insufficient documentation

## 2014-06-13 DIAGNOSIS — G8929 Other chronic pain: Secondary | ICD-10-CM | POA: Insufficient documentation

## 2014-06-13 DIAGNOSIS — S59909A Unspecified injury of unspecified elbow, initial encounter: Secondary | ICD-10-CM | POA: Insufficient documentation

## 2014-06-13 DIAGNOSIS — I10 Essential (primary) hypertension: Secondary | ICD-10-CM | POA: Insufficient documentation

## 2014-06-13 MED ORDER — DIAZEPAM 5 MG PO TABS: 5.0000 mg | ORAL_TABLET | Freq: Two times a day (BID) | ORAL | Status: DC

## 2014-06-13 MED ORDER — KETOROLAC TROMETHAMINE 60 MG/2ML IM SOLN
60.0000 mg | Freq: Once | INTRAMUSCULAR | Status: AC
  Administered 2014-06-13: 60 mg via INTRAMUSCULAR
  Filled 2014-06-13: qty 2

## 2014-06-13 MED ORDER — ONDANSETRON HCL 4 MG PO TABS: 4.0000 mg | ORAL_TABLET | Freq: Four times a day (QID) | ORAL | Status: DC

## 2014-06-13 MED ORDER — OXYCODONE-ACETAMINOPHEN 5-325 MG PO TABS: 1.0000 | ORAL_TABLET | Freq: Four times a day (QID) | ORAL | Status: DC | PRN

## 2014-06-13 NOTE — Discharge Instructions (Signed)
Shoulder Pain The shoulder is the joint that connects your arm to your body. Muscles and band-like tissues that connect bones to muscles (tendons) hold the joint together. Shoulder pain is felt if an injury or medical problem affects one or more parts of the shoulder. HOME CARE   Put ice on the sore area.  Put ice in a plastic bag.  Place a towel between your skin and the bag.  Leave the ice on for 15-20 minutes, 03-04 times a day for the first 2 days.  Stop using cold packs if they do not help with the pain.  If you were given something to keep your shoulder from moving (sling, shoulder immobilizer), wear it as told. Only take it off to shower or bathe.  Move your arm as little as possible, but keep your hand moving to prevent puffiness (swelling).  Squeeze a soft ball or foam pad as much as possible to help prevent swelling.  Take medicine as told by your doctor. GET HELP RIGHT AWAY IF:   Your arm, hand, or fingers are numb or tingling.  Your arm, hand, or fingers are puffy (swollen), painful, or turn white or blue.  You have more pain.  You have progressing new pain in your arm, hand, or fingers.  Your hand or fingers get cold.  Your medicine does not help lessen your pain. MAKE SURE YOU:   Understand these instructions.  Will watch your condition.  Will get help right away if you are not doing well or get worse. Document Released: 05/18/2008 Document Revised: 08/24/2012 Document Reviewed: 06/13/2012 [facility] Patient Information 2015 [place], [place]. This information is not intended to replace advice given to you by your health care provider. Make sure you discuss any questions you have with your health care provider.  Adhesive Capsulitis Sometimes the shoulder becomes stiff and is painful to move. Some people say it feels as if the shoulder is frozen in place. Because of this, the condition is called "frozen shoulder." Its medical name is adhesive capsulitis.  The  shoulder joint is made up of strong connective tissue that attaches the ball of the humerus to the shallow shoulder socket. This strong connective tissue is called the joint capsule. This tissue can become stiff and swollen. That is when adhesive capsulitis sets in. CAUSES  It is not always clear just what the cause adhesive capsulitis. Possibilities include:  Injury to the shoulder joint.  Strain. This is a repetitive injury brought about by overuse.  Lack of use. Perhaps your arm or hand was otherwise injured. It might have been in a sling for awhile. Or perhaps you were not using it to avoid pain.  Referred pain. This is a sort of trick the body plays. You feel pain in the shoulder. But, the pain actually comes from an injury somewhere else in the body.  Long-standing health problems. Several diseases can cause adhesive capsulitis. They include diabetes, heart disease, stroke, thyroid problems, rheumatoid arthritis and lung disease.  Being a women older than 40. Anyone can develop adhesive capsulitis but it is most common in women in this age group. SYMPTOMS   Pain.  It occurs when the arm is moved.  Parts of the shoulder might hurt if they are touched.  Pain is worse at night or when resting.  Soreness. It might not be strong enough to be called pain. But, the shoulder aches.  The shoulder does not move freely.  Muscle spasms.  Trouble sleeping because of shoulder ache  or pain. DIAGNOSIS  To decide if you have adhesive capsulitis, your healthcare provider will probably:  Ask about symptoms you have noticed.  Ask about your history of joint pain and anything that might have caused the pain.  Ask about your overall health.  Use hands to feel your shoulder and neck.  Ask you to move your shoulder in specific directions. This may indicate the origin of the pain.  Order imaging tests; pictures of the shoulder. They help pinpoint the source of the problem. An X-ray might be  used. For more detail, an MRI is often used. An MRI details the tendons, muscles and ligaments as well as the joint. TREATMENT  Adhesive capsulitis can be treated several ways. Most treatments can be done in a clinic or in your healthcare provider's office. Be sure to discuss the different options with your caregiver. They include:  Physical therapy. You will work on specific exercises to get your shoulder moving again. The exercises usually involve stretching. A physical therapist (a caregiver with special training) can show you what to do and what not to do. The exercises will need to be done daily.  Medication.  Over-the-counter medicines may relieve pain and inflammation (the body's way of reacting to injury or infection).  Corticosteroids. These are stronger drugs to reduce pain and inflammation. They are given by injection (shots) into the shoulder joint. Frequent treatment is not recommended.  Muscle relaxants. Medication may be prescribed to ease muscle spasms.  Treatment of underlying conditions. This means treating another condition that is causing your shoulder problem. This might be a rotator cuff (tendon) problem  Shoulder manipulation. The shoulder will be moved by your healthcare provider. You would be under general anesthesia (given a drug that puts you to sleep). You would not feel anything. Sometimes the joint will be injected with salt water (saline) at high pressure to break down internal scarring in the joint capsule.  Surgery. This is rarely needed. It may be suggested in advanced cases after all other treatment has failed. PROGNOSIS  In time, most people recover from adhesive capsulitis. Sometimes, however, the pain goes away but full movement of the shoulder does not return.  HOME CARE INSTRUCTIONS   Take any pain medications recommended by your healthcare provider. Follow the directions carefully.  If you have physical therapy, follow through with the therapist's  suggestions. Be sure you understand the exercises you will be doing. You should understand:  How often the exercises should be done.  How many times each exercise should be repeated.  How long they should be done.  What other activities you should do, or not do.  That you should warm up before doing any exercise. Just 5 to 10 minutes will help. Small, gentle movements should get your shoulder ready for more.  Avoid high-demand exercise that involves your shoulder such as throwing. This type of exercise can make pain worse.  Consider using cold packs. Cold may ease swelling and pain. Ask your healthcare provider if a cold pack might help you. If so, get directions on how and when to use them. SEEK MEDICAL CARE IF:   You have any questions about your medications.  Your pain continues to increase. Document Released: 09/27/2009 Document Revised: 02/22/2012 Document Reviewed: 09/27/2009 [facility] Patient Information 2015 [place], [place]. This information is not intended to replace advice given to you by your health care provider. Make sure you discuss any questions you have with your health care provider.

## 2014-06-13 NOTE — ED Notes (Signed)
Pt in c/o right shoulder pain after injury two weeks ago, states she was lifting something and heard a pop, has been attempting to treat this at home with OTC medications but symptoms continue

## 2014-06-13 NOTE — ED Provider Notes (Signed)
CSN: [id number]     Arrival date & time 06/13/14  2028 History   This chart was scribed for Junious SilkHannah Teniya Filter, PA-C working with Gilda Creasehristopher J. Pollina, MD by Evon Slackerrance Branch, ED Scribe. This patient was seen in room TR08C/TR08C and the patient's care was started at 9:54 PM.      Chief Complaint  Patient presents with  . Shoulder Pain   Patient is a 48 y.o. female presenting with shoulder pain. The history is provided by the patient. No language interpreter was used.  Shoulder Pain Pertinent negatives include no chest pain and no shortness of breath.   HPI Comments: Lanier EnsignLillie C Stowell is a 48 y.o. female who presents to the Emergency Department complaining of right shoulder pain onset 2 weeks ago. She states she was pulling something and heard something pop. There was no direct blow to her shoulder. She states that movement makes the pain worse. And she feels as though her ROM is decreasing since the time of the injury. She states she has tried warm and cold compress and tylenol with no relief.    Past Medical History  Diagnosis Date  . Depression   . Hypertension   . Abdominal pain, recurrent 2009    per CT of the abd/pelvis (01/26/2008) -  Diffuse inflammatory change of the descending and rectosigmoid colon.  Additional inflammatory changes within the terminal ileum raise concern for inflammatory bowel disease (Crohn's disease).  . Hypothyroidism 2003    [place] - showed increased 24 hour uptake and the findings were consistent with Grave's disease; patient is s/p RAI therapy (09/19/2002)  . Back pain, chronic 2001    per MRI in 2001 - disc protrusion L5-6, to the right   Past Surgical History  Procedure Laterality Date  . Tubal ligation    . Cystocele repair  2004    done by Dr. Su GrandMarc Nesi   Family History  Problem Relation Age of Onset  . Stroke Neg Hx   . Cancer Neg Hx    History  Substance Use Topics  . Smoking status: Never Smoker   .  Smokeless tobacco: Not on file  . Alcohol Use: No   OB History   Grav Para Term Preterm Abortions TAB SAB Ect Mult Living                 Review of Systems  Constitutional: Negative for fever.  Respiratory: Negative for shortness of breath.   Cardiovascular: Negative for chest pain.  Musculoskeletal: Positive for arthralgias and myalgias.  All other systems reviewed and are negative.   Allergies  Aspirin; Codeine; Hydrocodone; and Penicillins  Home Medications   Prior to Admission medications   Medication Sig Start Date End Date Taking? Authorizing Provider  cholecalciferol (VITAMIN D) 1000 UNITS tablet Take 1,000 Units by mouth daily.    Historical Provider, MD  levothyroxine (SYNTHROID, LEVOTHROID) 88 MCG tablet TAKE ONE TABLET BY MOUTH ONCE DAILY BEFORE BREAKFAST 12/20/13   Dow Adolphichard Kazibwe, MD   Triage Vitals: BP 171/86  Pulse 88  Temp(Src) 98 F (36.7 C) (Oral)  Resp 20  SpO2 100%  Physical Exam  Nursing note and vitals reviewed. Constitutional: She is oriented to person, place, and time. She appears well-developed and well-nourished. No distress.  HENT:  Head: Normocephalic and atraumatic.  Right Ear: External ear normal.  Left Ear: External ear normal.  Nose: Nose normal.  Mouth/Throat: Oropharynx is clear and moist.  Eyes: Conjunctivae are normal.  Neck: Normal range of motion.  Cardiovascular: Normal rate, regular rhythm, normal heart sounds, intact distal pulses and normal pulses.   Pulses:      Radial pulses are 2+ on the right side, and 2+ on the left side.  Pulmonary/Chest: Effort normal and breath sounds normal. No stridor. No respiratory distress. She has no wheezes. She has no rales. She exhibits no tenderness.  Abdominal: Soft. She exhibits no distension.  Musculoskeletal: Normal range of motion. She exhibits tenderness.  Tender to palpation diffusely over right shoulder and elbow, maximum tenderness over AC joint ROM limited due to pain in right  shoulder. Full ROM in right elbow.   Neurological: She is alert and oriented to person, place, and time. She has normal strength.  Grip strength 5/5 bilaterally  Skin: Skin is warm and dry. She is not diaphoretic. No erythema.  Psychiatric: She has a normal mood and affect. Her behavior is normal.    ED Course  Procedures (including critical care time) DIAGNOSTIC STUDIES: Oxygen Saturation is 100% on RA, normal by my interpretation.    COORDINATION OF CARE:    Labs Review Labs Reviewed - No data to display  Imaging Review  Dg Shoulder Right  06/13/2014   CLINICAL DATA:  Right shoulder pain  EXAM: RIGHT SHOULDER - 2+ VIEW  COMPARISON:  None.  FINDINGS: No fracture or dislocation is seen.  Mild degenerative changes of the acromioclavicular joint.  The visualized soft tissues are unremarkable.  Visualized right lung is clear.  IMPRESSION: No fracture or dislocation is seen.   Electronically Signed   By: Charline BillsSriyesh  Krishnan M.D.   On: 06/13/2014 21:44   Dg Elbow Complete Right  06/13/2014   CLINICAL DATA:  Pain.  Injury  EXAM: RIGHT ELBOW - COMPLETE 3+ VIEW  COMPARISON:  None.  FINDINGS: There is no evidence of fracture, dislocation, or joint effusion. There is no evidence of arthropathy or other focal bone abnormality. Soft tissues are unremarkable.  IMPRESSION: Negative.   Electronically Signed   By: Signa Kellaylor  Stroud M.D.   On: 06/13/2014 22:49     EKG Interpretation None      MDM   Final diagnoses:  Right shoulder pain   Patient presents to ED with right shoulder pain after an injury 2 weeks ago. TTP diffusely over right shoulder and elbow, with maximal tenderness to right AC joint. Neurovascularly intact, compartment soft. Patient feels improved after sling placement. Patient was given orthopedic follow up. Discussed risks of adhesive capsulitis with patient. Discussed reasons to return to ED immediately. Vitals signs stable for discharge. Patient / Family / Caregiver informed of  clinical course, understand medical decision-making process, and agree with plan.    I personally performed the services described in this documentation, which was scribed in my presence. The recorded information has been reviewed and is accurate.    Mora BellmanHannah S Carson Meche, PA-C 06/19/14 1013

## 2014-06-13 NOTE — ED Notes (Signed)
Patient transported to X-ray 

## 2014-06-13 NOTE — Telephone Encounter (Signed)
Pt called with c/o shoulder pain. I returned the call and no answer.  Unable to leave message on her phone.

## 2014-06-14 ENCOUNTER — Ambulatory Visit: Payer: Self-pay | Admitting: Internal Medicine

## 2014-06-19 ENCOUNTER — Ambulatory Visit: Payer: Self-pay | Admitting: Internal Medicine

## 2014-06-20 NOTE — ED Provider Notes (Signed)
Medical screening examination/treatment/procedure(s) were performed by non-physician practitioner and as supervising physician I was immediately available for consultation/collaboration.   EKG Interpretation None        Meaghan Whistler J. Teyona Nichelson, MD 06/20/14 0903 

## 2014-06-21 ENCOUNTER — Encounter: Payer: Self-pay | Admitting: Internal Medicine

## 2014-06-21 ENCOUNTER — Ambulatory Visit (INDEPENDENT_AMBULATORY_CARE_PROVIDER_SITE_OTHER): Payer: Self-pay | Admitting: Internal Medicine

## 2014-06-21 VITALS — BP 146/98 | HR 76 | Temp 97.5°F | Ht 61.0 in

## 2014-06-21 DIAGNOSIS — I10 Essential (primary) hypertension: Secondary | ICD-10-CM | POA: Insufficient documentation

## 2014-06-21 DIAGNOSIS — Z0000 Encounter for general adult medical examination without abnormal findings: Secondary | ICD-10-CM

## 2014-06-21 DIAGNOSIS — IMO0001 Reserved for inherently not codable concepts without codable children: Secondary | ICD-10-CM

## 2014-06-21 DIAGNOSIS — M25519 Pain in unspecified shoulder: Secondary | ICD-10-CM

## 2014-06-21 DIAGNOSIS — E039 Hypothyroidism, unspecified: Secondary | ICD-10-CM

## 2014-06-21 DIAGNOSIS — M25511 Pain in right shoulder: Secondary | ICD-10-CM

## 2014-06-21 DIAGNOSIS — F329 Major depressive disorder, single episode, unspecified: Secondary | ICD-10-CM

## 2014-06-21 DIAGNOSIS — R030 Elevated blood-pressure reading, without diagnosis of hypertension: Secondary | ICD-10-CM

## 2014-06-21 DIAGNOSIS — F3289 Other specified depressive episodes: Secondary | ICD-10-CM

## 2014-06-21 DIAGNOSIS — M12819 Other specific arthropathies, not elsewhere classified, unspecified shoulder: Secondary | ICD-10-CM | POA: Insufficient documentation

## 2014-06-21 MED ORDER — TRAMADOL HCL 50 MG PO TABS: 25.0000 mg | ORAL_TABLET | Freq: Four times a day (QID) | ORAL | Status: DC | PRN

## 2014-06-21 MED ORDER — LEVOTHYROXINE SODIUM 88 MCG PO TABS: 88.0000 ug | ORAL_TABLET | Freq: Every day | ORAL | Status: DC

## 2014-06-21 NOTE — Assessment & Plan Note (Addendum)
-  Continues to feeling sad, trouble sleeping, low energy but denies SI -Cannot rule out the presence of thyroid disorder since her labs have been checked for some time -Plan to recheck in 4-6 weeks on medicine and if she continues feeling this way will need to consider other causes, possibly a primary mood disorder

## 2014-06-21 NOTE — Assessment & Plan Note (Signed)
-  BP today 146/98, down from 171/86 at ED visit (7/1)  -Not sure if this is related to her pain but will need to reassess at next visit and potentially restarting her on the lisinopril

## 2014-06-21 NOTE — Progress Notes (Signed)
I saw and examined patient and discussed her care with Dr. Allena KatzPatel.  We reviewed the resident's history and exam and pertinent patient test results.  I agree with the assessment, diagnosis, and plan of care documented in the resident's note, with the following additional comments.  We discussed the option of referral to an orthopedic surgeon for evaluation of patient's shoulder pain; however, she declined due to lack of health insurance.  She does agree with referral to sports medicine once her orange card is approved.  She complains of fatigability, and reports having been out of her levothyroxine for least 2 weeks.  We discussed the advisable labs to evaluate fatigability (TSH, comprehensive metabolic panel, and CBC with differential to start) but she would like to defer these until the orange heart is approved; once it is approved, would go ahead and obtain these labs.  She has a history of depression and her symptoms may relate to depression; she reports that [facility] and is unwilling to return there.  If her labs are unrevealing and if upon reassessment after taking her Synthroid her symptoms persist, then a trial of an antidepressant medication could be considered; however, this will need to be done based upon her agreement to follow up regularly in the clinic.  Today she expressed some unwillingness to follow up in the clinic on a regular basis.

## 2014-06-21 NOTE — Assessment & Plan Note (Signed)
-  Referred her to sports medicine after she renews her orange card today -Prescribed her tramadol since she has had past reactions to NSAIDs and opioids -Will reassess in 2 weeks

## 2014-06-21 NOTE — Addendum Note (Signed)
Addended by: Beather ArbourPATEL, Prescious Hurless V on: 06/21/2014 10:28 PM   Modules accepted: Orders, Medications

## 2014-06-21 NOTE — Assessment & Plan Note (Addendum)
-  Has not been taking medicine for last 2 weeks -Refilled her levothyroxine today  -Would like for her to recheck TSH in 4-6 weeks  -Patient does not feel the medicine is adequately controlling her symptoms and thus reluctant to have labs done since they have historically shown otherwise

## 2014-06-21 NOTE — Assessment & Plan Note (Addendum)
-  Last PCP visit was 1 year ago -[facility] -Not open to being assessed for general health maintenance -Will need to check CBC, BMP, TSH at future visit

## 2014-06-21 NOTE — Patient Instructions (Signed)
For your shoulder pain, please take tramadol 25mg  every 6 hours. Do NOT take more than 4 doses in one day. Please start with 1 dose the first day, 2 doses the next day, 3 doses the third day, until you reach 4 doses the fourth day should you need it.   We will also refer you to Sports Medicine for further f/u of your shoulder pain as well.  For your hypothyroidism, please pick up your thyroid medicine.   Please come back to see me in 2 weeks and bring all your medicines. Thank you!
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
## 2014-08-06 ENCOUNTER — Other Ambulatory Visit: Payer: Self-pay | Admitting: Internal Medicine

## 2014-09-28 ENCOUNTER — Ambulatory Visit: Payer: Self-pay

## 2014-10-15 ENCOUNTER — Ambulatory Visit (INDEPENDENT_AMBULATORY_CARE_PROVIDER_SITE_OTHER): Payer: Self-pay | Admitting: *Deleted

## 2014-10-15 ENCOUNTER — Ambulatory Visit (INDEPENDENT_AMBULATORY_CARE_PROVIDER_SITE_OTHER): Payer: Self-pay | Admitting: Internal Medicine

## 2014-10-15 ENCOUNTER — Encounter: Payer: Self-pay | Admitting: Internal Medicine

## 2014-10-15 VITALS — BP 148/96 | HR 72 | Temp 98.1°F | Ht 61.0 in | Wt 214.7 lb

## 2014-10-15 DIAGNOSIS — Z23 Encounter for immunization: Secondary | ICD-10-CM

## 2014-10-15 DIAGNOSIS — IMO0001 Reserved for inherently not codable concepts without codable children: Secondary | ICD-10-CM

## 2014-10-15 DIAGNOSIS — E039 Hypothyroidism, unspecified: Secondary | ICD-10-CM

## 2014-10-15 DIAGNOSIS — Z0000 Encounter for general adult medical examination without abnormal findings: Secondary | ICD-10-CM

## 2014-10-15 DIAGNOSIS — M12811 Other specific arthropathies, not elsewhere classified, right shoulder: Secondary | ICD-10-CM

## 2014-10-15 DIAGNOSIS — R030 Elevated blood-pressure reading, without diagnosis of hypertension: Secondary | ICD-10-CM

## 2014-10-15 MED ORDER — CYCLOBENZAPRINE HCL 5 MG PO TABS: 5.0000 mg | ORAL_TABLET | Freq: Three times a day (TID) | ORAL | Status: DC | PRN

## 2014-10-15 MED ORDER — HYDROCHLOROTHIAZIDE 12.5 MG PO CAPS: 12.5000 mg | ORAL_CAPSULE | Freq: Every day | ORAL | Status: DC

## 2014-10-15 NOTE — Progress Notes (Signed)
Subjective:   Patient ID: Shannon Newton female   DOB: Nov 12, 1966 48 y.o.   MRN: [id number]  HPI: [name] is a 48 y.o. female with HTN and PMH as listed below presenting to opc today for acute visit with complaints of shoulder pain.   R shoulder pain--last seen by Dr. Allena KatzPatel 06/2014 for similar pain, recommended for referral to sports medicine at that time once orange card renewed.  Initial injury was approximately 3-4 months ago when she was lifting something heavy and heard a pop. After which, she started having pain in the right shoulder and arm. She does try to keep her arms moving but says it is painful.  Unfortunately, orange card has still not been renewed at this time but plans to go see [name] after visit today.  She was prescribed tramadol at that time and recommended to follow up.  Pain is about the same and maybe getting worse over time. Worse at night but constant throughout the day.  Pain located mainly on the right side of the neck and radiating to right arm and across to right side of back at some time. She endorses some numbness and tingling of R arm and hand at times.  She cannot take NSAIDs (reports hives with advil and has not tried naproxen in the past).   HTN--elevated BPs in the past, family history of heart disease and HTN, but is not on any medication and does not know why. BP today 132/97 and on repeat 148/96. Discussed starting medication today. She occasionally has headaches, some chest discomfort, and sob but no symptoms at this time.   Hypothyroidism--requesting refills on synthroid today. Will check TSH first.   Past Medical History  Diagnosis Date  . Depression   . Hypertension   . Abdominal pain, recurrent 2009    per CT of the abd/pelvis (01/26/2008) -  Diffuse inflammatory change of the descending and rectosigmoid colon.  Additional inflammatory changes within the terminal ileum raise concern for inflammatory bowel disease (Crohn's disease).  .  Hypothyroidism 2003    [place] - showed increased 24 hour uptake and the findings were consistent with Grave's disease; patient is s/p RAI therapy (09/19/2002)  . Back pain, chronic 2001    per MRI in 2001 - disc protrusion L5-6, to the right   Current Outpatient Prescriptions  Medication Sig Dispense Refill  . cholecalciferol (VITAMIN D) 1000 UNITS tablet Take 1,000 Units by mouth daily.    . cyclobenzaprine (FLEXERIL) 5 MG tablet Take 1 tablet (5 mg total) by mouth every 8 (eight) hours as needed for muscle spasms. 42 tablet 0  . levothyroxine (SYNTHROID, LEVOTHROID) 88 MCG tablet Take 1 tablet (88 mcg total) by mouth daily before breakfast. 30 tablet 3  . traMADol (ULTRAM) 50 MG tablet Take 0.5 tablets (25 mg total) by mouth every 6 (six) hours as needed (shoulder pain). Pain 30 tablet 0   No current facility-administered medications for this visit.   Family History  Problem Relation Age of Onset  . Stroke Neg Hx   . Cancer Neg Hx    History   Social History  . Marital Status: Single    Spouse Name: N/A    Number of Children: N/A  . Years of Education: N/A   Social History Main Topics  . Smoking status: Never Smoker   . Smokeless tobacco: None  . Alcohol Use: No  . Drug Use: No  . Sexual  Activity: None   Other Topics Concern  . None   Social History Narrative   Review of Systems:  Constitutional:  Denies fever, chills  HEENT:  Neck pain--right sided  Respiratory:  Denies SOB  Cardiovascular:  Denies chest pain  Gastrointestinal:  Denies nausea, vomiting, abdominal pain  Genitourinary:  Denies dysuria   Musculoskeletal:  R shoulder/arm pain  Skin:  Denies pallor, rash and wound.   Neurological:  Occasional headaches.    Objective:  Physical Exam: Filed Vitals:   10/15/14 0852 10/15/14 0928  BP: 132/97 148/96  Pulse: 82 72  Temp: 98.1 F (36.7 C)   TempSrc: Oral   Height: 5\' 1"  (1.549 m)   Weight: 214 lb 11.2 oz (97.387  kg)   SpO2: 100%    Vitals reviewed. General: sitting in chair, NAD HEENT: PERRL, EOMI, no scleral icterus Cardiac: RRR, no rubs, murmurs or gallops Pulm: clear to auscultation bilaterally, no wheezes, rales, or rhonchi Abd: soft, nontender, nondistended, BS present Ext: warm and well perfused, no pedal edema, +2DP B/L Neuro: alert and oriented X3, cranial nerves II-XII grossly intact, strength and sensation to light touch equal in bilateral upper and lower extremities  Assessment & Plan:  Discussed with Dr. Rogelia BogaButcher

## 2014-10-15 NOTE — Assessment & Plan Note (Signed)
Noted elevate BP's on review, however says has not been treated, although Dr. Allena KatzPatel reports above that she has been on lisinopril in the past. BP possibly elevated in setting of pain today, however, has been elevated on many visits. Thus, I favor starting treatment at this time and she in agreement.   -BMET today -start HCTZ 12.5mg  daily, and recheck in 2 weeks, can titrate up dose accordinly

## 2014-10-15 NOTE — Addendum Note (Signed)
Addended by: Baltazar ApoQURESHI, Naika Noto J on: 10/15/2014 03:59 PM   Modules accepted: Orders

## 2014-10-15 NOTE — Assessment & Plan Note (Signed)
Check TSH today and refill synthroid based on dosing adjustment if needed

## 2014-10-15 NOTE — Assessment & Plan Note (Addendum)
Flu vaccine today I have advised her that she needs a dedicated pcp visit for outstanding health maintenance including papsmear, mammogram, and colonoscopy.

## 2014-10-15 NOTE — Patient Instructions (Addendum)
General Instructions:  Please bring your medicines with you each time you come to clinic.  Medicines may include prescription medications, over-the-counter medications, herbal remedies, eye drops, vitamins, or other pills.  Please get your orange card--once in place let us know as you will need sports medicine referral  Please try flexeril up to every 8 hours as needed. Do not drive or operate heavy machinery while taking this medication   I would avoid driving until your injury has improved  You can try to get a sling from the pharmacy to help with your arm  Please return on next available with PCP for health maintenance  Rotator Cuff Injury Rotator cuff injury is any type of injury to the set of muscles and tendons that make up the stabilizing unit of your shoulder. This unit holds the ball of your upper arm bone (humerus) in the socket of your shoulder blade (scapula).  CAUSES Injuries to your rotator cuff most commonly come from sports or activities that cause your arm to be moved repeatedly over your head. Examples of this include throwing, weight lifting, swimming, or racquet sports. Long lasting (chronic) irritation of your rotator cuff can cause soreness and swelling (inflammation), bursitis, and eventual damage to your tendons, such as a tear (rupture). SIGNS AND SYMPTOMS Acute rotator cuff tear:  Sudden tearing sensation followed by severe pain shooting from your upper shoulder down your arm toward your elbow.  Decreased range of motion of your shoulder because of pain and muscle spasm.  Severe pain.  Inability to raise your arm out to the side because of pain and loss of muscle power (large tears). Chronic rotator cuff tear:  Pain that usually is worse at night and may interfere with sleep.  Gradual weakness and decreased shoulder motion as the pain worsens.  Decreased range of motion. Rotator cuff tendinitis:  Deep ache in your shoulder and the outside upper arm over  your shoulder.  Pain that comes on gradually and becomes worse when lifting your arm to the side or turning it inward. DIAGNOSIS Rotator cuff injury is diagnosed through a medical history, physical exam, and imaging exam. The medical history helps determine the type of rotator cuff injury. Your health care provider will look at your injured shoulder, feel the injured area, and ask you to move your shoulder in different positions. X-ray exams typically are done to rule out other causes of shoulder pain, such as fractures. MRI is the exam of choice for the most severe shoulder injuries because the images show muscles and tendons.  TREATMENT  Chronic tear:  Medicine for pain, such as acetaminophen or ibuprofen.  Physical therapy and range-of-motion exercises may be helpful in maintaining shoulder function and strength.  Steroid injections into your shoulder joint.  Surgical repair of the rotator cuff if the injury does not heal with noninvasive treatment. Acute tear:  Anti-inflammatory medicines such as ibuprofen and naproxen to help reduce pain and swelling.  A sling to help support your arm and rest your rotator cuff muscles. Long-term use of a sling is not advised. It may cause significant stiffening of the shoulder joint.  Surgery may be considered within a few weeks, especially in younger, active people, to return the shoulder to full function.  Indications for surgical treatment include the following:  Age younger than 60 years.  Rotator cuff tears that are complete.  Physical therapy, rest, and anti-inflammatory medicines have been used for 6-8 weeks, with no improvement.  Employment or sporting activity that  requires constant shoulder use. Tendinitis:  Anti-inflammatory medicines such as ibuprofen and naproxen to help reduce pain and swelling.  A sling to help support your arm and rest your rotator cuff muscles. Long-term use of a sling is not advised. It may cause  significant stiffening of the shoulder joint.  Severe tendinitis may require:  Steroid injections into your shoulder joint.  Physical therapy.  Surgery. HOME CARE INSTRUCTIONS   Apply ice to your injury:  Put ice in a plastic bag.  Place a towel between your skin and the bag.  Leave the ice on for 20 minutes, 2-3 times a day.  If you have a shoulder immobilizer (sling and straps), wear it until told otherwise by your health care provider.  You may want to sleep on several pillows or in a recliner at night to lessen swelling and pain.  Only take over-the-counter or prescription medicines for pain, discomfort, or fever as directed by your health care provider.  Do simple hand squeezing exercises with a soft rubber ball to decrease hand swelling. SEEK MEDICAL CARE IF:   Your shoulder pain increases, or new pain or numbness develops in your arm, hand, or fingers.  Your hand or fingers are colder than your other hand. SEEK IMMEDIATE MEDICAL CARE IF:   Your arm, hand, or fingers are numb or tingling.  Your arm, hand, or fingers are increasingly swollen and painful, or they turn white or blue. MAKE SURE YOU:  Understand these instructions.  Will watch your condition.  Will get help right away if you are not doing well or get worse. Document Released: 11/27/2000 Document Revised: 12/05/2013 Document Reviewed: 07/12/2013 [facility] Patient Information 2015 [employer] [place]. This information is not intended to replace advice given to you by your health care provider. Make sure you discuss any questions you have with your health care provider.
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
## 2014-10-16 ENCOUNTER — Telehealth: Payer: Self-pay | Admitting: Internal Medicine

## 2014-10-16 LAB — BASIC METABOLIC PANEL
BUN: 10 mg/dL (ref 6–23)
CHLORIDE: 104 meq/L (ref 96–112)
CO2: 23 mEq/L (ref 19–32)
Calcium: 9.4 mg/dL (ref 8.4–10.5)
Creat: 0.68 mg/dL (ref 0.50–1.10)
Glucose, Bld: 94 mg/dL (ref 70–99)
POTASSIUM: 4.4 meq/L (ref 3.5–5.3)
Sodium: 138 mEq/L (ref 135–145)

## 2014-10-16 LAB — TSH: TSH: 2.134 u[IU]/mL (ref 0.350–4.500)

## 2014-10-16 MED ORDER — LEVOTHYROXINE SODIUM 88 MCG PO TABS: 88.0000 ug | ORAL_TABLET | Freq: Every day | ORAL | Status: DC

## 2014-10-16 NOTE — Telephone Encounter (Signed)
I called Shannon Newton to review TSH level and sports medicine referral. I also notified her that her prescriptions are ready for pick up at the pharmacy to be started and she will be called for an appointment with sports medicine. She voiced understanding. She also confirmed that she did pick up and start taking the HCTZ 12.5mg  for BP.

## 2014-10-16 NOTE — Addendum Note (Signed)
Addended by: Baltazar ApoQURESHI, Ivori Storr J on: 10/16/2014 09:50 AM   Modules accepted: Orders

## 2014-10-16 NOTE — Progress Notes (Signed)
Internal Medicine Clinic Attending  Case discussed with Dr. Qureshi soon after the resident saw the patient.  We reviewed the resident's history and exam and pertinent patient test results.  I agree with the assessment, diagnosis, and plan of care documented in the resident's note. 

## 2014-10-22 ENCOUNTER — Encounter: Payer: Self-pay | Admitting: *Deleted

## 2014-11-05 ENCOUNTER — Encounter: Payer: Self-pay | Admitting: Sports Medicine

## 2014-11-05 ENCOUNTER — Ambulatory Visit (INDEPENDENT_AMBULATORY_CARE_PROVIDER_SITE_OTHER): Payer: Self-pay | Admitting: Sports Medicine

## 2014-11-05 VITALS — BP 148/87 | Ht 61.0 in | Wt 230.0 lb

## 2014-11-05 DIAGNOSIS — M12811 Other specific arthropathies, not elsewhere classified, right shoulder: Secondary | ICD-10-CM

## 2014-11-05 DIAGNOSIS — M12511 Traumatic arthropathy, right shoulder: Secondary | ICD-10-CM

## 2014-11-05 MED ORDER — METHYLPREDNISOLONE ACETATE 40 MG/ML IJ SUSP
40.0000 mg | Freq: Once | INTRAMUSCULAR | Status: AC
  Administered 2014-11-05: 40 mg via INTRA_ARTICULAR
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
## 2014-12-08 ENCOUNTER — Other Ambulatory Visit: Payer: Self-pay | Admitting: Internal Medicine

## 2014-12-10 ENCOUNTER — Encounter: Payer: Self-pay | Admitting: Sports Medicine

## 2014-12-10 ENCOUNTER — Ambulatory Visit (INDEPENDENT_AMBULATORY_CARE_PROVIDER_SITE_OTHER): Payer: No Typology Code available for payment source | Admitting: Sports Medicine

## 2014-12-10 VITALS — BP 142/87 | HR 80 | Ht 61.0 in | Wt 230.0 lb

## 2014-12-10 DIAGNOSIS — M25511 Pain in right shoulder: Secondary | ICD-10-CM

## 2014-12-10 MED ORDER — TRAMADOL HCL 50 MG PO TABS: ORAL_TABLET | ORAL | Status: DC

## 2014-12-10 NOTE — Progress Notes (Signed)
   Subjective:    Patient ID: Shannon Newton, female    DOB: 12-14-1966, 48 y.o.   MRN: [id number]  HPI  Patient comes in today for follow-up on right shoulder pain. Unfortunately she is still experiencing pain despite a recent subacromial cortisone injection. Her pain is particularly bad at night. She would like to try some tramadol. She has used it in the past for other pains with limited success. She continues to localize the pain to the anterior and lateral shoulder. Worse with any sort of shoulder motion. Pain resolves at rest. Some radiating pain down the arm into the hand but again this all resolves when resting her shoulder. Previous x-rays showed no acute findings. She has been performing her range of motion exercises at home.    Review of Systems     Objective:   Physical Exam Obese. No acute distress. Sitting comfortably in the exam room.  Right shoulder: Limited active and passive range of motion in all directions. Positive painful arc. Good passive external rotation. Positive empty can, positive Hawkins. Rotator cuff strength is 5/5 bilaterally but reproducible of pain with resisted supraspinatus on the right. Negative Spurling's. No tenderness over the ac joint nor over the bicipital groove. Neurovascularly intact distally.       Assessment & Plan:  Persistent right shoulder pain-rule out rotator cuff tear  Patient's body habitus and limited range of motion makes ultrasound evaluation challenging. Therefore, I will order an MRI of her right shoulder to rule out a rotator cuff tear. Phone follow-up 2-3 days after that study to go over the results and delineate more definitive treatment. In the meantime I've given her a prescription for tramadol to take as needed for pain.

## 2014-12-16 ENCOUNTER — Ambulatory Visit
Admission: RE | Admit: 2014-12-16 | Discharge: 2014-12-16 | Disposition: A | Discharge status: Home / Self Care | Payer: No Typology Code available for payment source | Source: Ambulatory Visit | Attending: Sports Medicine | Admitting: Sports Medicine

## 2014-12-16 DIAGNOSIS — M25511 Pain in right shoulder: Secondary | ICD-10-CM

## 2014-12-21 ENCOUNTER — Telehealth: Payer: Self-pay | Admitting: Sports Medicine

## 2014-12-21 NOTE — Telephone Encounter (Signed)
DR FREEHILL MON 01/21/15 @ 915AM [address] [place] [id number] [phone]

## 2014-12-21 NOTE — Telephone Encounter (Signed)
I spoke with the  patient on the phone today about the MRI findings of her right shoulder. 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 Significant subacromial bursitis/synovitis and biceps tendinopathy. She has failed conservative treatment to date including a subacromial cortisone injection. I think the findings on her MRI are significant enough that it warrants surgical consultation. Patient would like to pursue this option. I will need to refer [facility] to [facility] where hopefully they can offer her some sort of financial assistance. I will defer further treatment of her shoulder problem to the orthopedist. Follow-up with me when necessary.

## 2015-02-22 ENCOUNTER — Ambulatory Visit: Payer: No Typology Code available for payment source | Admitting: Internal Medicine

## 2015-02-25 ENCOUNTER — Encounter: Payer: Self-pay | Admitting: Internal Medicine

## 2015-02-25 ENCOUNTER — Ambulatory Visit (INDEPENDENT_AMBULATORY_CARE_PROVIDER_SITE_OTHER): Payer: Self-pay | Admitting: Internal Medicine

## 2015-02-25 VITALS — BP 145/94 | HR 75 | Temp 97.6°F | Ht 61.0 in | Wt 221.0 lb

## 2015-02-25 DIAGNOSIS — R32 Unspecified urinary incontinence: Secondary | ICD-10-CM | POA: Insufficient documentation

## 2015-02-25 DIAGNOSIS — N3945 Continuous leakage: Secondary | ICD-10-CM

## 2015-02-25 DIAGNOSIS — IMO0001 Reserved for inherently not codable concepts without codable children: Secondary | ICD-10-CM

## 2015-02-25 DIAGNOSIS — R030 Elevated blood-pressure reading, without diagnosis of hypertension: Secondary | ICD-10-CM

## 2015-02-25 MED ORDER — SOLIFENACIN SUCCINATE 5 MG PO TABS: 5.0000 mg | ORAL_TABLET | Freq: Every day | ORAL | Status: DC

## 2015-02-25 MED ORDER — OXYBUTYNIN CHLORIDE 5 MG/5ML PO SYRP: 5.0000 mg | ORAL_SOLUTION | Freq: Two times a day (BID) | ORAL | Status: DC

## 2015-02-25 MED ORDER — AMLODIPINE BESYLATE 5 MG PO TABS: 5.0000 mg | ORAL_TABLET | Freq: Every day | ORAL | Status: DC

## 2015-02-25 NOTE — Patient Instructions (Addendum)
Begin taking the Ditropan  2 time a day and stop taking the HCTZ.  Begin taking amlodipine  daily for your blood pressure.   I will place a referral back to [facility]. You can also call over there to you OBGYN to get an appointment.  You really need to be seen by your gynecologist.      Oxybutynin tablets What is this medicine? OXYBUTYNIN (ox i BYOO ti nin) is used to treat overactive bladder. This medicine reduces the amount of bathroom visits. It may also help to control wetting accidents. This medicine may be used for other purposes; ask your health care provider or pharmacist if you have questions. COMMON BRAND NAME(S): Ditropan What should I tell my health care provider before I take this medicine? They need to know if you have any of these conditions: -autonomic neuropathy -dementia -difficulty passing urine -glaucoma -intestinal obstruction -kidney disease -liver disease -myasthenia gravis -Parkinson's disease -an unusual or allergic reaction to oxybutynin, other medicines, foods, dyes, or preservatives -pregnant or trying to get pregnant -breast-feeding How should I use this medicine? Take this medicine by mouth with a glass of water. Follow the directions on the prescription label. You can take this medicine with or without food. Take your medicine at regular intervals. Do not take your medicine more often than directed. Talk to your pediatrician regarding the use of this medicine in children. Special care may be needed. While this drug may be prescribed for children as young as 5 years for selected conditions, precautions do apply. Overdosage: If you think you have taken too much of this medicine contact a poison control center or emergency room at once. NOTE: This medicine is only for you. Do not share this medicine with others. What if I miss a dose? If you miss a dose, take it as soon as you can. If it is almost time for your next dose, take only that dose.  Do not take double or extra doses. What may interact with this medicine? -antihistamines for allergy, cough and cold -atropine -certain medicines for bladder problems like oxybutynin, tolterodine -certain medicines for Parkinson's disease like benztropine, trihexyphenidyl -certain medicines for stomach problems like dicyclomine, hyoscyamine -certain medicines for travel sickness like scopolamine -clarithromycin -erythromycin -ipratropium -medicines for fungal infections, like fluconazole, itraconazole, ketoconazole or voriconazole This list may not describe all possible interactions. Give your health care provider a list of all the medicines, herbs, non-prescription drugs, or dietary supplements you use. Also tell them if you smoke, drink alcohol, or use illegal drugs. Some items may interact with your medicine. What should I watch for while using this medicine? It may take a few weeks to notice the full benefit from this medicine. You may need to limit your intake tea, coffee, caffeinated sodas, and alcohol. These drinks may make your symptoms worse. You may get drowsy or dizzy. Do not drive, use machinery, or do anything that needs mental alertness until you know how this medicine affects you. Do not stand or sit up quickly, especially if you are an older patient. This reduces the risk of dizzy or fainting spells. Alcohol may interfere with the effect of this medicine. Avoid alcoholic drinks. Your mouth may get dry. Chewing sugarless gum or sucking hard candy, and drinking plenty of water may help. Contact your doctor if the problem does not go away or is severe. This medicine may cause dry eyes and blurred vision. If you wear contact lenses, you may feel some discomfort. Lubricating drops may  help. See your eyecare professional if the problem does not go away or is severe. Avoid extreme heat. This medicine can cause you to sweat less than normal. Your body temperature could increase to  dangerous levels, which may lead to heat stroke. What side effects may I notice from receiving this medicine? Side effects that you should report to your doctor or health care professional as soon as possible: -allergic reactions like skin rash, itching or hives, swelling of the face, lips, or tongue -agitation -breathing problems -confusion -fever -flushing (reddening of the skin) -hallucinations -memory loss -pain or difficulty passing urine -palpitations -unusually weak or tired Side effects that usually do not require medical attention (report to your doctor or health care professional if they continue or are bothersome): -constipation -headache -sexual difficulties (impotence) This list may not describe all possible side effects. Call your doctor for medical advice about side effects. You may report side effects to FDA at 1-800-FDA-1088. Where should I keep my medicine? Keep out of the reach of children. Store at room temperature between 15 and 30 degrees C (59 and 86 degrees F). Protect from moisture and humidity. Throw away any unused medicine after the expiration date. NOTE: This sheet is a summary. It may not cover all possible information. If you have questions about this medicine, talk to your doctor, pharmacist, or health care provider.  2015, Elsevier/Gold Standard. (2014-02-15 10:57:08) Aliskiren; Amlodipine oral tablets What is this medicine? ALISKIREN; AMLODIPINE (a lis KYE ren; am LOE di peen) is a combination of a renin inhibitor and a calcium channel blocker. This medicine is used to treat high blood pressure. This medicine may be used for other purposes; ask your health care provider or pharmacist if you have questions. COMMON BRAND NAME(S): Tekamlo What should I tell my health care provider before I take this medicine? They need to know if you have any of these conditions: -dehydration -diabetes -heart problems like heart failure or aortic stenosis -kidney  disease -liver disease -an unusual or allergic reaction to aliskiren, amlodipine, other medicines, foods, dyes, or preservatives -pregnant or trying to get pregnant -breast-feeding How should I use this medicine? Take this medicine by mouth with a glass of water. Follow the directions on your prescription label. You may take this medicine with or without food, but try to take it the same way every time. Take your medicine at regular intervals. Do not take it more often than directed. Do not stop taking except on your doctor's advice. Talk to your pediatrician regarding the use of this medicine in children. Special care may be needed. Overdosage: If you think you've taken too much of this medicine contact a poison control center or emergency room at once. Overdosage: If you think you have taken too much of this medicine contact a poison control center or emergency room at once. NOTE: This medicine is only for you. Do not share this medicine with others. What if I miss a dose? If you miss a dose, take it as soon as you can. If it is almost time for your next dose, take only that dose. Do not take double or extra doses. What may interact with this medicine? -atorvastatin -certain medicines for fungal infections like ketoconazole and itraconazole -cyclosporine -diuretics -medicines for blood pressure -potassium supplements -salt substitutes with potassium This list may not describe all possible interactions. Give your health care provider a list of all the medicines, herbs, non-prescription drugs, or dietary supplements you use. Also tell them if you smoke,  drink alcohol, or use illegal drugs. Some items may interact with your medicine. What should I watch for while using this medicine? Visit your doctor or health care professional for regular checks on your progress. Check your blood pressure as directed. Ask your doctor or health care professional what your blood pressure should be and when you  should contact him or her. If you have diabetes and are taking a medicine called an angiotensin-receptor-blocker (ARB) or angiotensin-converting-enzyme-inhibitor (ACE inhibitor), do not take this medicine. Talk to your doctor or health care professional for more information. Women should inform their doctor if they wish to become pregnant or think they might be pregnant. There is a potential for serious side effects to an unborn child. Talk to your health care professional or pharmacist for more information. This medicine may make you feel confused, dizzy or lightheaded. Do not drive, use machinery, or do anything that needs mental alertness until you know how this medicine affects you. To reduce the risk of dizzy or fainting spells, do not sit or stand up quickly, especially if you are an older patient. Avoid alcoholic drinks; they can make you more dizzy. What side effects may I notice from receiving this medicine? Side effects that you should report to your doctor or health care professional as soon as possible: -allergic reactions like skin rash or hives, swelling of the hands, feet, face, lips, throat, or tongue -breathing problems -chest pain -fast, irregular heartbeat -feeling faint or lightheaded, falls -low blood pressure -seizures -swelling of ankles, feet, hands Side effects that usually do not require medical attention (Report these to your doctor or health care professional if they continue or are bothersome.): -cough -diarrhea -dry mouth -facial flushing -nausea, vomiting -upset stomach -weak or tired This list may not describe all possible side effects. Call your doctor for medical advice about side effects. You may report side effects to FDA at 1-800-FDA-1088. Where should I keep my medicine? Keep out of the reach of children. Store at room temperature between 15 and 30 degrees C (59 and 86 degrees F). Protect from heat and moisture. Throw away any unused medicine after the  expiration date. NOTE: This sheet is a summary. It may not cover all possible information. If you have questions about this medicine, talk to your doctor, pharmacist, or health care provider.  2015, Elsevier/Gold Standard. (2011-04-03 14:53:35)

## 2015-02-25 NOTE — Assessment & Plan Note (Addendum)
BP Readings from Last 3 Encounters:  02/25/15 145/94  12/10/14 142/87  11/05/14 148/87    Lab Results  Component Value Date   NA 138 10/15/2014   K 4.4 10/15/2014   CREATININE 0.68 10/15/2014    Assessment: Blood pressure control:  BP mildly elevated Progress toward BP goal:   Unchaged Comments: Pt takign only 12.5mg  HCTZ, but in the setting of her incontinence and frequent urination, a diuretic does not seem like the best choice for this patient.  Plan: Medications:  Stopping diuretic and will begin amlodipine 5mg  daily Other plans: f/u in 2 weeks for BP recheck

## 2015-02-25 NOTE — Progress Notes (Signed)
Case discussed with Dr. Glenn at the time of the visit.  We reviewed the resident's history and exam and pertinent patient test results.  I agree with the assessment, diagnosis, and plan of care documented in the resident's note.   

## 2015-02-25 NOTE — Assessment & Plan Note (Addendum)
Pt with h/o unrinary incontinence and is s/p vaginal sling with several revisions and she is still having urinary incontinence and urgency. The incontinence seems to occur any time and not just with stress. She states that as soon as she has the urge to urinate, she has go immediately. She has tried [employer] exercises without much improvement in her symptoms. She has not been seen by her GYN in a few ys but used to be taking Vesicare and ditropan which were discontinued after an ED visit in '13. She states that both medicines did seem to help control her sx. - Will restart Ditropan 5mg  BID - Referring back to GYN  Addendum: Received note from GYN that the pt needs to see Urogynecologist 1st. Recommended Lavella Hammockatherine Matthews [place] [phone]. Referral placed.

## 2015-02-25 NOTE — Progress Notes (Signed)
Patient ID: CALISE DUNCKEL, female   DOB: 1966/05/12, 49 y.o.   MRN: [id number]  Subjective:   Patient ID: BELVIA GOTSCHALL female   DOB: [birth year] 49 y.o.   MRN: [id number]  HPI: Ms.Quiana C Harral is a 49 y.o. F w/ PMH hypothyroidism and HTN presents for an acute visit for a UTI  She endorses frequent urination and pain with urination over the past few weeks that has improved now. She has a h/o sling that has been modified 3 times, all at [facility], but she states is not working, and she is still having significant urinary incontinence. She is able to hold her urine sometimes, but is unable to hold her urine. In the past she has been taking ditropan and vesicare but has stopped the medication.    Past Medical History  Diagnosis Date  . Depression   . Hypertension   . Abdominal pain, recurrent 2009    per CT of the abd/pelvis (01/26/2008) -  Diffuse inflammatory change of the descending and rectosigmoid colon.  Additional inflammatory changes within the terminal ileum raise concern for inflammatory bowel disease (Crohn's disease).  . Hypothyroidism 2003    Thyroid US done in 2003 (results in [place]) - showed increased 24 hour uptake and the findings were consistent with Grave's disease; patient is s/p RAI therapy (09/19/2002)  . Back pain, chronic 2001    per MRI in 2001 - disc protrusion L5-6, to the right   Current Outpatient Prescriptions  Medication Sig Dispense Refill  . cholecalciferol (VITAMIN D) 1000 UNITS tablet Take 1,000 Units by mouth daily.    . cyclobenzaprine (FLEXERIL) 5 MG tablet TAKE ONE TABLET BY MOUTH EVERY 8 HOURS AS NEEDED FOR MUSCLE SPASMS 42 tablet 0  . hydrochlorothiazide (MICROZIDE) 12.5 MG capsule Take 1 capsule (12.5 mg total) by mouth daily. 30 capsule 1  . levothyroxine (SYNTHROID, LEVOTHROID) 88 MCG tablet Take 1 tablet (88 mcg total) by mouth daily before breakfast. 30 tablet 3  . traMADol (ULTRAM) 50 MG tablet Take 1 to 2 tablets by mouth at bedtime 60  tablet 1   No current facility-administered medications for this visit.   Family History  Problem Relation Age of Onset  . Stroke Neg Hx   . Cancer Neg Hx    History   Social History  . Marital Status: Single    Spouse Name: N/A  . Number of Children: N/A  . Years of Education: N/A   Social History Main Topics  . Smoking status: Never Smoker   . Smokeless tobacco: Not on file  . Alcohol Use: No  . Drug Use: No  . Sexual Activity: Not on file   Other Topics Concern  . None   Social History Narrative   Review of Systems: Constitutional: Denies fever, chills. +fatigue.  HEENT: +sore throat.   Respiratory: Denies SOB, DOE Cardiovascular: +chest pain, not actively.  Gastrointestinal: +nausea, abdominal pain, constipation.  Genitourinary: +dysuria, urgency, frequency Musculoskeletal: Denies gait problem.  Skin: Denies rash and wound.  Neurological: Denies dizziness, syncope, weakness Psychiatric/Behavioral: Denies mood changes, confusion  Objective:  Physical Exam: Filed Vitals:   02/25/15 0921  BP: 148/93  Pulse: 75  Temp: 97.6 F (36.4 C)  TempSrc: Oral  Height:  (1.549 m)  Weight: 221 lb (100.245 kg)  SpO2: 100%   Constitutional: Vital signs reviewed.  Patient is a well-developed and well-nourished female in no acute distress and cooperative with exam. Alert and oriented x3.  Head: Normocephalic and atraumatic  Eyes: EOMI. No scleral icterus. .  Cardiovascular: RRR, no MRG Pulmonary/Chest: Normal respiratory effort, CTAB, no wheezes, rales, or rhonchi Abdominal: Soft. Non-distended, bowel sounds are normal, no guarding present. Diffuse TTP, mostly in the mid abdomen, lower abd nontender. Musculoskeletal: No joint deformities. Moves all 4 extremities. Neurological: A&O x3, cranial nerve II-XII are grossly intact, no focal motor deficit.  Skin: Warm, dry and intact.  Psychiatric: Normal mood and affect. Speech and behavior is normal.   Assessment &  Plan:   Please refer to Problem List based Assessment and Plan

## 2015-03-05 NOTE — Addendum Note (Signed)
Addended by: Genelle GatherGLENN, Garrie Woodin F on: 03/05/2015 10:58 PM   Modules accepted: Orders

## 2015-03-06 ENCOUNTER — Encounter: Payer: Self-pay | Admitting: *Deleted

## 2015-03-11 ENCOUNTER — Telehealth: Payer: Self-pay | Admitting: *Deleted

## 2015-03-11 ENCOUNTER — Other Ambulatory Visit: Payer: Self-pay | Admitting: Internal Medicine

## 2015-03-11 ENCOUNTER — Ambulatory Visit (INDEPENDENT_AMBULATORY_CARE_PROVIDER_SITE_OTHER): Payer: Self-pay | Admitting: Internal Medicine

## 2015-03-11 ENCOUNTER — Encounter: Payer: Self-pay | Admitting: Internal Medicine

## 2015-03-11 VITALS — BP 142/86 | HR 80 | Temp 98.1°F | Ht 61.0 in | Wt 220.6 lb

## 2015-03-11 DIAGNOSIS — Z1231 Encounter for screening mammogram for malignant neoplasm of breast: Secondary | ICD-10-CM

## 2015-03-11 DIAGNOSIS — R32 Unspecified urinary incontinence: Secondary | ICD-10-CM

## 2015-03-11 DIAGNOSIS — I10 Essential (primary) hypertension: Secondary | ICD-10-CM

## 2015-03-11 DIAGNOSIS — M12511 Traumatic arthropathy, right shoulder: Secondary | ICD-10-CM

## 2015-03-11 DIAGNOSIS — E039 Hypothyroidism, unspecified: Secondary | ICD-10-CM

## 2015-03-11 DIAGNOSIS — M12811 Other specific arthropathies, not elsewhere classified, right shoulder: Secondary | ICD-10-CM

## 2015-03-11 DIAGNOSIS — Z0000 Encounter for general adult medical examination without abnormal findings: Secondary | ICD-10-CM

## 2015-03-11 DIAGNOSIS — E559 Vitamin D deficiency, unspecified: Secondary | ICD-10-CM

## 2015-03-11 LAB — CBC WITH DIFFERENTIAL/PLATELET
BASOS ABS: 0 10*3/uL (ref 0.0–0.1)
BASOS PCT: 0 % (ref 0–1)
Eosinophils Absolute: 0.1 10*3/uL (ref 0.0–0.7)
Eosinophils Relative: 1 % (ref 0–5)
HCT: 38.4 % (ref 36.0–46.0)
Hemoglobin: 12.9 g/dL (ref 12.0–15.0)
Lymphocytes Relative: 39 % (ref 12–46)
Lymphs Abs: 2.2 10*3/uL (ref 0.7–4.0)
MCH: 23.8 pg — ABNORMAL LOW (ref 26.0–34.0)
MCHC: 33.6 g/dL (ref 30.0–36.0)
MCV: 70.7 fL — AB (ref 78.0–100.0)
MONO ABS: 0.6 10*3/uL (ref 0.1–1.0)
MPV: 9 fL (ref 8.6–12.4)
Monocytes Relative: 10 % (ref 3–12)
NEUTROS PCT: 50 % (ref 43–77)
Neutro Abs: 2.9 10*3/uL (ref 1.7–7.7)
Platelets: 293 10*3/uL (ref 150–400)
RBC: 5.43 MIL/uL — ABNORMAL HIGH (ref 3.87–5.11)
RDW: 15.5 % (ref 11.5–15.5)
WBC: 5.7 10*3/uL (ref 4.0–10.5)

## 2015-03-11 LAB — COMPLETE METABOLIC PANEL WITH GFR
ALBUMIN: 3.9 g/dL (ref 3.5–5.2)
ALT: 25 U/L (ref 0–35)
AST: 20 U/L (ref 0–37)
Alkaline Phosphatase: 97 U/L (ref 39–117)
BILIRUBIN TOTAL: 0.4 mg/dL (ref 0.2–1.2)
BUN: 8 mg/dL (ref 6–23)
CHLORIDE: 107 meq/L (ref 96–112)
CO2: 23 meq/L (ref 19–32)
Calcium: 8.9 mg/dL (ref 8.4–10.5)
Creat: 0.65 mg/dL (ref 0.50–1.10)
GFR, Est African American: >89 mL/min
GFR, Est Non African American: >89 mL/min
Glucose, Bld: 92 mg/dL (ref 70–99)
POTASSIUM: 4.1 meq/L (ref 3.5–5.3)
SODIUM: 138 meq/L (ref 135–145)
Total Protein: 7.2 g/dL (ref 6.0–8.3)

## 2015-03-11 LAB — TSH: TSH: 3.918 u[IU]/mL (ref 0.350–4.500)

## 2015-03-11 MED ORDER — AMLODIPINE BESYLATE 10 MG PO TABS: 10.0000 mg | ORAL_TABLET | Freq: Every day | ORAL | Status: DC

## 2015-03-11 MED ORDER — TRAMADOL HCL 50 MG PO TABS: ORAL_TABLET | ORAL | Status: DC

## 2015-03-11 MED ORDER — CYCLOBENZAPRINE HCL 5 MG PO TABS: ORAL_TABLET | ORAL | Status: DC

## 2015-03-11 NOTE — Assessment & Plan Note (Addendum)
Assessment: Pt with chronic right shoulder pain with last MRI Jan 2016 with severe anterior supraspinatus tendiopathy with tear, subacromonial bursitis, and biceps tendinopathy who presents with uncontrolled pain and limited ROM without recent injury/fall/trauma.   Plan: -Prescribe tramadol 50-100 mg PRN at bedtime  -Prescribe flexeril 5 mg Q 8 hr PRN muscle spasms -Pt to reschedule appt with orthopedic surgeon Dr. Dorita SciaraFreehill and has appt with Dr. Margaretha Sheffieldraper on 03/18/15 which she is aware of

## 2015-03-11 NOTE — Progress Notes (Signed)
Patient ID: Shannon EnsignLillie C Newton, female   DOB: [birth year], 49 y.o.   MRN: [id number]    Subjective:   Patient ID: Shannon Newton female   DOB: [birth year] 49 y.o.   MRN: [id number]  HPI: [name] is a 49 y.o. pleasant woman with past medical history of hypertension, hypothyroidism, chronic urinary incontinence s/p sling, OSA, OA, and right rotator cuff arthropathy who presents for follow-up visit of hypertension and chief complaint to right shoulder pain.   She was last seen on 3/14 for urinary symptoms in setting of chronic urinary incontinence and was placed back on ditropan BID. Her HCTZ was stopped and instead norvasc 5 mg daily was started which she reports compliance with. She reports her urinary symptoms have improved and has an appointment with urogynecology coming up. She has mild headache, blurry vision, and mild LE edema but denies chest pain or lightheadedness.   Her last TSH was normal in November. She reports compliance with taking synthroid 88 mcg daily. She has chronic feeling of coldness, dry skin, constipation and occasional dysphagia.    She has had chronic right shoulder pain with last MRI in January that revealed severe anterior supraspinatus tendinopathy with tear, subacromial bursitis, and biceps tendinopathy. She was referred to orthopedic surgeon Dr. Dorita SciaraFreehill in [place] by Dr. Margaretha Sheffieldraper which she cancelled her appointment on 01/21/15 but plans on rescheduling. She had no improvement with 2 prior intraarticular steroid injections. She has run out of tramadol and flexeril which provide some relief of pain and spasms. She reports constant pain sometimes 10/10 that is worse at night. She is unable to raise or abduct her arm and has significantly reduced range of motion. She reports weakness but denies paraesthesias. She has associated neck stiffness with spasms and headaches. She denies recent injury, trauma, or fall.     Past Medical History  Diagnosis Date  . Depression   .  Hypertension   . Abdominal pain, recurrent 2009    per CT of the abd/pelvis (01/26/2008) -  Diffuse inflammatory change of the descending and rectosigmoid colon.  Additional inflammatory changes within the terminal ileum raise concern for inflammatory bowel disease (Crohn's disease).  . Hypothyroidism 2003    [place] - showed increased 24 hour uptake and the findings were consistent with Grave's disease; patient is s/p RAI therapy (09/19/2002)  . Back pain, chronic 2001    per MRI in 2001 - disc protrusion L5-6, to the right   Current Outpatient Prescriptions  Medication Sig Dispense Refill  . amLODipine (NORVASC) 5 MG tablet Take 1 tablet (5 mg total) by mouth daily. 30 tablet 1  . cholecalciferol (VITAMIN D) 1000 UNITS tablet Take 1,000 Units by mouth daily.    . cyclobenzaprine (FLEXERIL) 5 MG tablet TAKE ONE TABLET BY MOUTH EVERY 8 HOURS AS NEEDED FOR MUSCLE SPASMS 42 tablet 0  . levothyroxine (SYNTHROID, LEVOTHROID) 88 MCG tablet Take 1 tablet (88 mcg total) by mouth daily before breakfast. 30 tablet 3  . oxybutynin (DITROPAN) 5 MG/5ML syrup Take 5 mLs (5 mg total) by mouth 2 (two) times daily. 60 mL 1  . traMADol (ULTRAM) 50 MG tablet Take 1 to 2 tablets by mouth at bedtime 60 tablet 1   No current facility-administered medications for this visit.   Family History  Problem Relation Age of Onset  . Stroke Neg Hx   . Cancer Neg Hx    History   Social History  . Marital  Status: Single    Spouse Name: N/A  . Number of Children: N/A  . Years of Education: N/A   Social History Main Topics  . Smoking status: Never Smoker   . Smokeless tobacco: Not on file  . Alcohol Use: No  . Drug Use: No  . Sexual Activity: Not on file   Other Topics Concern  . None   Social History Narrative   Review of Systems: Review of Systems  Constitutional:       Chronic feeling cold  Eyes: Positive for blurred vision.  Respiratory: Negative for cough,  shortness of breath and wheezing.   Cardiovascular: Positive for leg swelling (trace b/l ankle edema). Negative for chest pain.  Gastrointestinal: Positive for abdominal pain (chronic lower abdomen) and constipation. Negative for nausea, vomiting and diarrhea.       Dysphagia occasionally   Genitourinary: Negative for dysuria, urgency and frequency.       Chronic urinary incontinence   Musculoskeletal: Positive for joint pain (chronic right shoulder) and neck pain.  Neurological: Positive for headaches. Negative for dizziness.  Endo/Heme/Allergies: Positive for environmental allergies.  Psychiatric/Behavioral: The patient is nervous/anxious (occasionally ).      Objective:  Physical Exam: Filed Vitals:   03/11/15 0916  BP: 142/86  Pulse: 80  Temp: 98.1 F (36.7 C)  TempSrc: Oral  Height:  (1.549 m)  Weight: 220 lb 9.6 oz (100.064 kg)  SpO2: 100%    Physical Exam  Constitutional: She is oriented to person, place, and time. She appears well-developed and well-nourished. No distress.  HENT:  Head: Normocephalic and atraumatic.  Eyes: EOM are normal.  Neck: Neck supple.  Muscle tension on right side of neck with mildly reduced  neck ROM   Cardiovascular: Normal rate, regular rhythm and normal heart sounds.   Pulmonary/Chest: Effort normal and breath sounds normal. No respiratory distress. She has no wheezes. She has no rales.  Abdominal: Soft. Bowel sounds are normal. She exhibits no distension. There is no tenderness. There is no rebound and no guarding.  Musculoskeletal: She exhibits edema (trace b/l pedal edmea) and tenderness (right glenohumeral joint).  ROM of right shoulder, unable to abduct and forward flex  Neurological: She is alert and oriented to person, place, and time.  Skin: Skin is warm and dry. No rash noted. She is not diaphoretic. No erythema. No pallor.  Psychiatric: She has a normal mood and affect. Her behavior is normal. Judgment and thought content  normal.    Assessment & Plan:   Please see problem list for problem-based assessment and plan

## 2015-03-11 NOTE — Assessment & Plan Note (Addendum)
-  Obtain CBC w/dif and 25-OH Vitamin D level  -Pt declined HIV Ab testing -Pt declined colonoscopy and pap smear at this time, inquire again at next visit.  -Pt scheduled for screening mammogram   ADDENDUM 03/13/15: Pt with vitamin D deficiency with level of 16. Will start ergocalciferol 50 K U weekly for 8 weeks with repeat levels drawn after completion of therapy.

## 2015-03-11 NOTE — Assessment & Plan Note (Signed)
Assessment: Pt with normal TSH level on 10/15/14 compliant with thyroid replacement therapy who presents with symptoms of hypothyroidism.   Plan: -Obtain TSH level and adjust synthroid 88 mcg daily accordingly -Continue to monitor thyroid function

## 2015-03-11 NOTE — Assessment & Plan Note (Addendum)
Assessment: Pt with chronic urinary incontinence compliant with newly started anti-cholinergic therapy and off diuretic therapy who presents with improved symptoms.   Plan:  -Continue oxybutynin 5 mg BID -Pt has upcoming appointment with urogynecology on 3/30 which she is aware of

## 2015-03-11 NOTE — Patient Instructions (Addendum)
-  Start taking tramadol and flexeril for you shoulder pain and reschedule your appt with Dr. Dorita SciaraFreehill  -Increase norvasc to 10 mg daily from 5 mg daily for high blood pressure -Think about pap smear and colonoscopy for next time  -Will check your lab work today and call you with the results -Very nice meeting you!   General Instructions:   Please bring your medicines with you each time you come to clinic.  Medicines may include prescription medications, over-the-counter medications, herbal remedies, eye drops, vitamins, or other pills.   Progress Toward Treatment Goals:  No flowsheet data found.  Self Care Goals & Plans:  No flowsheet data found.  No flowsheet data found.   Care Management & Community Referrals:  No flowsheet data found.

## 2015-03-11 NOTE — Assessment & Plan Note (Signed)
Assessment: Pt with well-controlled hypertension compliant with newly started one-class (CCB) anti-hypertensive therapy who presents with blood pressure of 142/86.   Plan:  -BP 142/86 not at goal <140/90 -Increase amlodipine from 5 mg daily to 10 mg daily  -Obtain CMP

## 2015-03-11 NOTE — Telephone Encounter (Signed)
Call to patient notified that she has a Mammogram scheduled for 03/13/2015 at 3:30 PM after [facility] appointment [facility].  Patient to go to Admitting after GYN appointment to register for the Mammogram and have it done on 03/13/2015 at 3:30 PM.  Discussed no perfume, 2 piece clothing and no deodorant.  Angelina OkGladys Aleksandr Pellow, RN 03/11/2015 11:44 AM

## 2015-03-11 NOTE — Telephone Encounter (Signed)
Call to patient to notify her that she has an appointment scheduled in the [facility]YN Clinic [facility] on 03/13/2015 at 1:30 PM.  Patient said that she was not aware of the appointment and will go.  Patient was also notified of her appointment with Sports Medicine on 03/18/2015.  This also she said she was unaware of but will go.  Patients also needs a Mammogram will call to see if this can be done on the same day as her GYN appointment.  Angelina OkGladys Sohil Timko, RN 03/11/2015 10::01 AM

## 2015-03-12 LAB — VITAMIN D 25 HYDROXY (VIT D DEFICIENCY, FRACTURES): VIT D 25 HYDROXY: 16 ng/mL — AB (ref 30–100)

## 2015-03-12 NOTE — Progress Notes (Signed)
Case discussed with Dr. Rabbani soon after the resident saw the patient.  We reviewed the resident's history and exam and pertinent patient test results.  I agree with the assessment, diagnosis, and plan of care documented in the resident's note. 

## 2015-03-13 ENCOUNTER — Encounter: Payer: Self-pay | Admitting: Obstetrics & Gynecology

## 2015-03-13 ENCOUNTER — Ambulatory Visit (HOSPITAL_COMMUNITY)
Admission: RE | Admit: 2015-03-13 | Discharge: 2015-03-13 | Disposition: A | Discharge status: Home / Self Care | Payer: No Typology Code available for payment source | Source: Ambulatory Visit | Attending: Internal Medicine | Admitting: Internal Medicine

## 2015-03-13 ENCOUNTER — Ambulatory Visit (INDEPENDENT_AMBULATORY_CARE_PROVIDER_SITE_OTHER): Payer: Self-pay | Admitting: Obstetrics & Gynecology

## 2015-03-13 VITALS — BP 132/95 | HR 91 | Temp 97.7°F | Wt 214.9 lb

## 2015-03-13 DIAGNOSIS — E559 Vitamin D deficiency, unspecified: Secondary | ICD-10-CM | POA: Insufficient documentation

## 2015-03-13 DIAGNOSIS — Z1231 Encounter for screening mammogram for malignant neoplasm of breast: Secondary | ICD-10-CM | POA: Insufficient documentation

## 2015-03-13 DIAGNOSIS — R32 Unspecified urinary incontinence: Secondary | ICD-10-CM

## 2015-03-13 MED ORDER — VITAMIN D (ERGOCALCIFEROL) 1.25 MG (50000 UNIT) PO CAPS: 50000.0000 [IU] | ORAL_CAPSULE | ORAL | Status: DC

## 2015-03-13 NOTE — Progress Notes (Signed)
Patient ID: Shannon Newton, female   DOB: September 26, 1966, [age] y.o.   MRN: [id number] Shannon Newton refers pt to Urology.  Pt is an established patient with Shannon Newton at [facility]. Pt has the orange card and needs to be referred by PCP.  Contacted Internal Medicine and left a detailed message with referral nurse. Pt can have appointment any day/any time.

## 2015-03-13 NOTE — Addendum Note (Signed)
Addended byOtis Brace: [place] on: 03/13/2015 08:52 PM   Modules accepted: Orders, Medications

## 2015-03-13 NOTE — Progress Notes (Signed)
   Subjective:    Patient ID: Shannon Newton, female    DOB: 05/28/1966, 49 y.o.   MRN: [id number]  HPI  49 yo obese AA lady who is here because of constant urinary leaking. She had a sling placed by a urologist and has been on oxybutinin with no help.  She gives a history of all normal paps and denies any gyn problems today.  Review of Systems She will be getting her mammogram today.    Objective:   Physical Exam [id number] Breathing and ambulating normally Abd- obese       Assessment & Plan:  Urinary incontinence-ref to urology

## 2015-03-18 ENCOUNTER — Ambulatory Visit (INDEPENDENT_AMBULATORY_CARE_PROVIDER_SITE_OTHER): Payer: No Typology Code available for payment source | Admitting: Sports Medicine

## 2015-03-18 ENCOUNTER — Encounter: Payer: Self-pay | Admitting: Sports Medicine

## 2015-03-18 VITALS — BP 151/89 | Ht 61.0 in | Wt 220.0 lb

## 2015-03-18 DIAGNOSIS — M75101 Unspecified rotator cuff tear or rupture of right shoulder, not specified as traumatic: Secondary | ICD-10-CM

## 2015-03-18 NOTE — Progress Notes (Signed)
   Subjective:    Patient ID: Shannon Newton, female    DOB: 30-Nov-1966, 49 y.o.   MRN: [id number]  HPI   Patient comes in today with persistent right shoulder pain. 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 She also has evidence of biceps long head tenosynovitis. She has failed conservative treatment to date. Symptoms began several months ago after a traumatic event. She has tried medications as well as cortisone injections. Her pain persists particularly at night. She is now to the point to where she is unable to do her job without significant pain.    Review of Systems     Objective:   Physical Exam Well-developed, well-nourished. No acute distress.  Right shoulder: Limited range of motion in all planes secondary to pain. Markedly positive empty can, markedly positive Hawkins. 4/5 strength with resisted supraspinatus. Tender to palpation of the bicipital groove. Negative drop arm. Positive painful arc. Neurovascularly intact distally.  MRI is as above       Assessment & Plan:  Persistent right shoulder pain secondary to severe supraspinatus tendinopathy, subacromial bursitis, and biceps tenosynovitis  Patient has failed conservative treatment to date. I think she is a good candidate for arthroscopic intervention. I have arranged for consultation with Dr.Xu later this week at [facility]. I have given her work restrictions including no pushing, pulling, or lifting greater than 10 pounds. I would defer further treatment for this chronic shoulder problem to Dr.Xu. Patient will follow-up with me as needed.

## 2015-03-26 ENCOUNTER — Other Ambulatory Visit (HOSPITAL_BASED_OUTPATIENT_CLINIC_OR_DEPARTMENT_OTHER): Payer: Self-pay | Admitting: Orthopaedic Surgery

## 2015-03-27 ENCOUNTER — Encounter: Payer: Self-pay | Admitting: Obstetrics and Gynecology

## 2015-03-28 ENCOUNTER — Encounter (HOSPITAL_BASED_OUTPATIENT_CLINIC_OR_DEPARTMENT_OTHER): Payer: Self-pay | Admitting: *Deleted

## 2015-03-28 NOTE — Progress Notes (Signed)
To come in for ekg-[facility]-labs done 3/38/16-pt probably has sleep apnea-not tested-has had to stay overnight post op in past for problems waking up Mom has sleep apnea To bring all meds and overnight bag in case she has to stay

## 2015-03-28 NOTE — Progress Notes (Signed)
   03/28/15 1613  OBSTRUCTIVE SLEEP APNEA  Have you ever been diagnosed with sleep apnea through a sleep study? No  Do you snore loudly (loud enough to be heard through closed doors)?  1  Do you often feel tired, fatigued, or sleepy during the daytime? 1  Has anyone observed you stop breathing during your sleep? 1  Do you have, or are you being treated for high blood pressure? 1  BMI more than 35 kg/m2? 1  Age over 51 years old? 0  Neck circumference greater than 40 cm/16 inches? 1  Gender: 0

## 2015-03-29 ENCOUNTER — Encounter (HOSPITAL_BASED_OUTPATIENT_CLINIC_OR_DEPARTMENT_OTHER)
Admission: RE | Admit: 2015-03-29 | Discharge: 2015-03-29 | Disposition: A | Discharge status: Home / Self Care | Payer: No Typology Code available for payment source | Source: Ambulatory Visit | Attending: Orthopaedic Surgery | Admitting: Orthopaedic Surgery

## 2015-03-29 ENCOUNTER — Other Ambulatory Visit: Payer: Self-pay

## 2015-03-29 DIAGNOSIS — Z01810 Encounter for preprocedural cardiovascular examination: Secondary | ICD-10-CM | POA: Insufficient documentation

## 2015-04-03 ENCOUNTER — Encounter (HOSPITAL_BASED_OUTPATIENT_CLINIC_OR_DEPARTMENT_OTHER)
Admission: RE | Disposition: A | Discharge status: Home / Self Care | Payer: Self-pay | Source: Ambulatory Visit | Attending: Orthopaedic Surgery

## 2015-04-03 ENCOUNTER — Encounter (HOSPITAL_BASED_OUTPATIENT_CLINIC_OR_DEPARTMENT_OTHER): Payer: Self-pay | Admitting: *Deleted

## 2015-04-03 ENCOUNTER — Ambulatory Visit (HOSPITAL_BASED_OUTPATIENT_CLINIC_OR_DEPARTMENT_OTHER): Payer: Self-pay | Admitting: Anesthesiology

## 2015-04-03 ENCOUNTER — Ambulatory Visit (HOSPITAL_BASED_OUTPATIENT_CLINIC_OR_DEPARTMENT_OTHER)
Admission: RE | Admit: 2015-04-03 | Discharge: 2015-04-03 | Disposition: A | Discharge status: Home / Self Care | Payer: Self-pay | Source: Ambulatory Visit | Attending: Orthopaedic Surgery | Admitting: Orthopaedic Surgery

## 2015-04-03 ENCOUNTER — Ambulatory Visit (HOSPITAL_BASED_OUTPATIENT_CLINIC_OR_DEPARTMENT_OTHER): Payer: No Typology Code available for payment source | Admitting: Anesthesiology

## 2015-04-03 DIAGNOSIS — Y929 Unspecified place or not applicable: Secondary | ICD-10-CM | POA: Insufficient documentation

## 2015-04-03 DIAGNOSIS — E039 Hypothyroidism, unspecified: Secondary | ICD-10-CM | POA: Insufficient documentation

## 2015-04-03 DIAGNOSIS — I10 Essential (primary) hypertension: Secondary | ICD-10-CM | POA: Insufficient documentation

## 2015-04-03 DIAGNOSIS — M65811 Other synovitis and tenosynovitis, right shoulder: Secondary | ICD-10-CM | POA: Insufficient documentation

## 2015-04-03 DIAGNOSIS — X58XXXA Exposure to other specified factors, initial encounter: Secondary | ICD-10-CM | POA: Insufficient documentation

## 2015-04-03 DIAGNOSIS — Z6841 Body Mass Index (BMI) 40.0 and over, adult: Secondary | ICD-10-CM | POA: Insufficient documentation

## 2015-04-03 DIAGNOSIS — M75101 Unspecified rotator cuff tear or rupture of right shoulder, not specified as traumatic: Secondary | ICD-10-CM | POA: Insufficient documentation

## 2015-04-03 DIAGNOSIS — M549 Dorsalgia, unspecified: Secondary | ICD-10-CM | POA: Insufficient documentation

## 2015-04-03 DIAGNOSIS — Y999 Unspecified external cause status: Secondary | ICD-10-CM | POA: Insufficient documentation

## 2015-04-03 DIAGNOSIS — Y939 Activity, unspecified: Secondary | ICD-10-CM | POA: Insufficient documentation

## 2015-04-03 DIAGNOSIS — M25811 Other specified joint disorders, right shoulder: Secondary | ICD-10-CM | POA: Insufficient documentation

## 2015-04-03 DIAGNOSIS — G8929 Other chronic pain: Secondary | ICD-10-CM | POA: Insufficient documentation

## 2015-04-03 DIAGNOSIS — S43431A Superior glenoid labrum lesion of right shoulder, initial encounter: Secondary | ICD-10-CM | POA: Insufficient documentation

## 2015-04-03 DIAGNOSIS — Z79891 Long term (current) use of opiate analgesic: Secondary | ICD-10-CM | POA: Insufficient documentation

## 2015-04-03 DIAGNOSIS — Z79899 Other long term (current) drug therapy: Secondary | ICD-10-CM | POA: Insufficient documentation

## 2015-04-03 HISTORY — PX: SHOULDER ARTHROSCOPY WITH SUBACROMIAL DECOMPRESSION: SHX5684

## 2015-04-03 HISTORY — DX: Sickle-cell trait: D57.3

## 2015-04-03 HISTORY — DX: Frequency of micturition: R35.0

## 2015-04-03 HISTORY — DX: Adverse effect of unspecified anesthetic, initial encounter: T41.45XA

## 2015-04-03 HISTORY — DX: Other complications of anesthesia, initial encounter: T88.59XA

## 2015-04-03 SURGERY — SHOULDER ARTHROSCOPY WITH SUBACROMIAL DECOMPRESSION
Anesthesia: General | Site: Shoulder | Laterality: Right

## 2015-04-03 MED ORDER — FENTANYL CITRATE (PF) 100 MCG/2ML IJ SOLN
INTRAMUSCULAR | Status: AC
  Filled 2015-04-03: qty 2

## 2015-04-03 MED ORDER — IBUPROFEN 100 MG/5ML PO SUSP
200.0000 mg | Freq: Four times a day (QID) | ORAL | Status: DC | PRN
Start: 2015-04-03 — End: 2015-04-03

## 2015-04-03 MED ORDER — SENNOSIDES-DOCUSATE SODIUM 8.6-50 MG PO TABS
1.0000 | ORAL_TABLET | Freq: Every evening | ORAL | Status: DC | PRN
Start: 2015-04-03 — End: 2018-09-07

## 2015-04-03 MED ORDER — SODIUM CHLORIDE 0.9 % IJ SOLN
INTRAMUSCULAR | Status: DC | PRN
  Administered 2015-04-03: 10 mL via INTRAVENOUS

## 2015-04-03 MED ORDER — SUCCINYLCHOLINE CHLORIDE 20 MG/ML IJ SOLN
INTRAMUSCULAR | Status: DC | PRN
  Administered 2015-04-03: 100 mg via INTRAVENOUS

## 2015-04-03 MED ORDER — PROPOFOL 10 MG/ML IV BOLUS
INTRAVENOUS | Status: DC | PRN
  Administered 2015-04-03: 150 mg via INTRAVENOUS

## 2015-04-03 MED ORDER — FENTANYL CITRATE (PF) 100 MCG/2ML IJ SOLN
INTRAMUSCULAR | Status: AC
  Filled 2015-04-03: qty 4

## 2015-04-03 MED ORDER — SODIUM CHLORIDE 0.9 % IR SOLN
Status: DC | PRN
  Administered 2015-04-03: 6000 mL

## 2015-04-03 MED ORDER — LACTATED RINGERS IV SOLN
INTRAVENOUS | Status: DC
Start: 2015-04-03 — End: 2015-04-03
  Administered 2015-04-03: 11:00:00 via INTRAVENOUS

## 2015-04-03 MED ORDER — MIDAZOLAM HCL 2 MG/2ML IJ SOLN
1.0000 mg | INTRAMUSCULAR | Status: DC | PRN
  Administered 2015-04-03: 2 mg via INTRAVENOUS
  Administered 2015-04-03: 1 mg via INTRAVENOUS

## 2015-04-03 MED ORDER — IBUPROFEN 200 MG PO TABS: 200.0000 mg | ORAL_TABLET | Freq: Four times a day (QID) | ORAL | Status: DC | PRN

## 2015-04-03 MED ORDER — OXYCODONE-ACETAMINOPHEN 5-325 MG PO TABS: 1.0000 | ORAL_TABLET | ORAL | Status: DC | PRN

## 2015-04-03 MED ORDER — BUPIVACAINE HCL (PF) 0.5 % IJ SOLN
INTRAMUSCULAR | Status: AC
  Filled 2015-04-03: qty 30

## 2015-04-03 MED ORDER — LIDOCAINE-EPINEPHRINE 1 %-1:100000 IJ SOLN
INTRAMUSCULAR | Status: AC
  Filled 2015-04-03: qty 1

## 2015-04-03 MED ORDER — LIDOCAINE-EPINEPHRINE 1 %-1:100000 IJ SOLN
INTRAMUSCULAR | Status: DC | PRN
  Administered 2015-04-03: 10 mL

## 2015-04-03 MED ORDER — ONDANSETRON HCL 4 MG/2ML IJ SOLN
INTRAMUSCULAR | Status: DC | PRN
  Administered 2015-04-03: 4 mg via INTRAVENOUS

## 2015-04-03 MED ORDER — CLINDAMYCIN PHOSPHATE 900 MG/50ML IV SOLN
900.0000 mg | INTRAVENOUS | Status: AC
  Administered 2015-04-03: 900 mg via INTRAVENOUS

## 2015-04-03 MED ORDER — MEPERIDINE HCL 25 MG/ML IJ SOLN: 6.2500 mg | INTRAMUSCULAR | Status: DC | PRN

## 2015-04-03 MED ORDER — LIDOCAINE HCL (CARDIAC) 20 MG/ML IV SOLN
INTRAVENOUS | Status: DC | PRN
  Administered 2015-04-03: 80 mg via INTRAVENOUS

## 2015-04-03 MED ORDER — BUPIVACAINE-EPINEPHRINE (PF) 0.5% -1:200000 IJ SOLN
INTRAMUSCULAR | Status: AC
  Filled 2015-04-03: qty 30

## 2015-04-03 MED ORDER — FENTANYL CITRATE (PF) 100 MCG/2ML IJ SOLN
50.0000 ug | INTRAMUSCULAR | Status: DC | PRN
  Administered 2015-04-03: 100 ug via INTRAVENOUS

## 2015-04-03 MED ORDER — CLINDAMYCIN PHOSPHATE 900 MG/50ML IV SOLN
INTRAVENOUS | Status: AC
  Filled 2015-04-03: qty 50

## 2015-04-03 MED ORDER — FENTANYL CITRATE (PF) 100 MCG/2ML IJ SOLN
INTRAMUSCULAR | Status: DC | PRN
  Administered 2015-04-03: 50 ug via INTRAVENOUS

## 2015-04-03 MED ORDER — MIDAZOLAM HCL 2 MG/2ML IJ SOLN
INTRAMUSCULAR | Status: AC
  Filled 2015-04-03: qty 2

## 2015-04-03 MED ORDER — KETOROLAC TROMETHAMINE 30 MG/ML IJ SOLN: 30.0000 mg | Freq: Once | INTRAMUSCULAR | Status: DC | PRN

## 2015-04-03 MED ORDER — DEXAMETHASONE SODIUM PHOSPHATE 4 MG/ML IJ SOLN
INTRAMUSCULAR | Status: DC | PRN
  Administered 2015-04-03: 10 mg via INTRAVENOUS

## 2015-04-03 MED ORDER — HYDROMORPHONE HCL 1 MG/ML IJ SOLN: 0.2500 mg | INTRAMUSCULAR | Status: DC | PRN

## 2015-04-03 SURGICAL SUPPLY — 64 items
ANCHOR SUT BIO SW 4.75X19.1 (Anchor) ×3 IMPLANT
BENZOIN TINCTURE PRP APPL 2/3 (GAUZE/BANDAGES/DRESSINGS) ×3 IMPLANT
BLADE CUDA SHAVER 3.5 (BLADE) ×3 IMPLANT
BLADE CUTTER GATOR 3.5 (BLADE) IMPLANT
BLADE GREAT WHITE 4.2 (BLADE) IMPLANT
BLADE GREAT WHITE 4.2MM (BLADE)
BLADE SURG 15 STRL LF DISP TIS (BLADE) IMPLANT
BLADE SURG 15 STRL SS (BLADE)
BUR OVAL 4.0 (BURR) ×3 IMPLANT
CANNULA 5.75X71 LONG (CANNULA) ×3 IMPLANT
CANNULA TWIST IN 8.25X7CM (CANNULA) IMPLANT
CLOSURE STERI-STRIP 1/2X4 (GAUZE/BANDAGES/DRESSINGS) ×1
CLSR STERI-STRIP ANTIMIC 1/2X4 (GAUZE/BANDAGES/DRESSINGS) ×2 IMPLANT
DECANTER SPIKE VIAL GLASS SM (MISCELLANEOUS) IMPLANT
DRAPE INCISE IOBAN 66X45 STRL (DRAPES) ×3 IMPLANT
DRAPE STERI 35X30 U-POUCH (DRAPES) ×3 IMPLANT
DRAPE SURG 17X23 STRL (DRAPES) ×3 IMPLANT
DRAPE U 20/CS (DRAPES) ×3 IMPLANT
DRAPE U-SHAPE 47X51 STRL (DRAPES) ×6 IMPLANT
DRAPE U-SHAPE 76X120 STRL (DRAPES) ×6 IMPLANT
DRSG PAD ABDOMINAL 8X10 ST (GAUZE/BANDAGES/DRESSINGS) ×3 IMPLANT
DURAPREP 26ML APPLICATOR (WOUND CARE) ×6 IMPLANT
ELECT REM PT RETURN 9FT ADLT (ELECTROSURGICAL)
ELECTRODE REM PT RTRN 9FT ADLT (ELECTROSURGICAL) IMPLANT
GAUZE SPONGE 4X4 12PLY STRL (GAUZE/BANDAGES/DRESSINGS) ×3 IMPLANT
GAUZE XEROFORM 1X8 LF (GAUZE/BANDAGES/DRESSINGS) ×3 IMPLANT
GLOVE BIOGEL PI IND STRL 7.0 (GLOVE) ×1 IMPLANT
GLOVE BIOGEL PI INDICATOR 7.0 (GLOVE) ×2
GLOVE ECLIPSE 6.5 STRL STRAW (GLOVE) ×6 IMPLANT
GLOVE EXAM NITRILE LRG STRL (GLOVE) ×6 IMPLANT
GLOVE NEODERM STRL 7.5 LF PF (GLOVE) ×2 IMPLANT
GLOVE SURG NEODERM 7.5  LF PF (GLOVE) ×4
GLOVE SURG SYN 7.5  E (GLOVE) ×4
GLOVE SURG SYN 7.5 E (GLOVE) ×2 IMPLANT
GOWN STRL REIN XL XLG (GOWN DISPOSABLE) ×6 IMPLANT
GOWN STRL REUS W/ TWL LRG LVL3 (GOWN DISPOSABLE) ×1 IMPLANT
GOWN STRL REUS W/TWL LRG LVL3 (GOWN DISPOSABLE) ×2
IMMOBILIZER SHOULDER FOAM XLGE (SOFTGOODS) IMPLANT
KIT SHOULDER TRACTION (DRAPES) ×3 IMPLANT
MANIFOLD NEPTUNE II (INSTRUMENTS) ×3 IMPLANT
NEEDLE SCORPION MULTI FIRE (NEEDLE) ×3 IMPLANT
PACK ARTHROSCOPY DSU (CUSTOM PROCEDURE TRAY) ×3 IMPLANT
PACK BASIN DAY SURGERY FS (CUSTOM PROCEDURE TRAY) ×3 IMPLANT
SET ARTHROSCOPY TUBING (MISCELLANEOUS) ×2
SET ARTHROSCOPY TUBING LN (MISCELLANEOUS) ×1 IMPLANT
SHEET MEDIUM DRAPE 40X70 STRL (DRAPES) IMPLANT
SLEEVE SCD COMPRESS KNEE MED (MISCELLANEOUS) ×3 IMPLANT
SLING ARM IMMOBILIZER LRG (SOFTGOODS) ×3 IMPLANT
SLING ARM IMMOBILIZER MED (SOFTGOODS) IMPLANT
SLING ARM LRG ADULT FOAM STRAP (SOFTGOODS) IMPLANT
SLING ARM MED ADULT FOAM STRAP (SOFTGOODS) IMPLANT
SLING ARM XL FOAM STRAP (SOFTGOODS) IMPLANT
SUT ETHILON 3 0 PS 1 (SUTURE) ×3 IMPLANT
SUT FIBERWIRE #2 38 T-5 BLUE (SUTURE)
SUT TIGER TAPE 7 IN WHITE (SUTURE) IMPLANT
SUTURE FIBERWR #2 38 T-5 BLUE (SUTURE) IMPLANT
SYR 50ML LL SCALE MARK (SYRINGE) IMPLANT
TAPE FIBER 2MM 7IN #2 BLUE (SUTURE) ×3 IMPLANT
TOWEL OR 17X24 6PK STRL BLUE (TOWEL DISPOSABLE) ×3 IMPLANT
TOWEL OR NON WOVEN STRL DISP B (DISPOSABLE) ×3 IMPLANT
TUBE CONNECTING 20'X1/4 (TUBING)
TUBE CONNECTING 20X1/4 (TUBING) IMPLANT
WAND STAR VAC 90 (SURGICAL WAND) ×3 IMPLANT
WATER STERILE IRR 1000ML POUR (IV SOLUTION) ×3 IMPLANT

## 2015-04-03 NOTE — Anesthesia Postprocedure Evaluation (Signed)
  Anesthesia Post-op Note  Patient: Shannon Newton  Procedure(s) Performed: Procedure(s): RIGHT SHOULDER ARTHROSCOPY WITH SUBACROMIAL DECOMPRESSION, DEBRIDEMENT ROTATOR CUFF REPAIR (Right)  Patient Location: PACU  Anesthesia Type: General, Regional for post op pain   Level of Consciousness: awake, alert  and oriented  Airway and Oxygen Therapy: Patient Spontanous Breathing  Post-op Pain: none  Post-op Assessment: Post-op Vital signs reviewed  Post-op Vital Signs: Reviewed  Last Vitals:  Filed Vitals:   04/03/15 1415  BP: 137/92  Pulse: 93  Temp:   Resp: 20    Complications: No apparent anesthesia complications

## 2015-04-03 NOTE — Anesthesia Procedure Notes (Signed)
Procedure Name: Intubation Date/Time: 04/03/2015 11:52 AM Performed by: Gar GibbonKEETON, Chyenne Sobczak S Pre-anesthesia Checklist: Patient identified, Emergency Drugs available, Suction available and Patient being monitored Patient Re-evaluated:Patient Re-evaluated prior to inductionOxygen Delivery Method: Circle System Utilized Preoxygenation: Pre-oxygenation with 100% oxygen Intubation Type: IV induction Ventilation: Mask ventilation without difficulty Laryngoscope Size: Mac and 3 Grade View: Grade III Tube type: Oral Tube size: 7.0 mm Number of attempts: 2 Airway Equipment and Method: Stylet,  Oral airway and Patient positioned with wedge pillow Placement Confirmation: ETT inserted through vocal cords under direct vision,  positive ETCO2 and breath sounds checked- equal and bilateral Secured at: 22 cm Tube secured with: Tape Dental Injury: Teeth and Oropharynx as per pre-operative assessment  Future Recommendations: Recommend- induction with short-acting agent, and alternative techniques readily available Comments: Attempt x 1 w Hyacinth MeekerMiller 3 without visualization of cords. Esophegeal intubation recognized with 1 ventilation attempt. Oral Suction of clear secretions.  Attempt w Mac 3, cords never visualized but successful endotracheal intubation with one attempt.  EBBS checked/ Crews, etc02 per monitor.

## 2015-04-03 NOTE — Transfer of Care (Signed)
Immediate Anesthesia Transfer of Care Note  Patient: Shannon Newton  Procedure(s) Performed: Procedure(s): RIGHT SHOULDER ARTHROSCOPY WITH SUBACROMIAL DECOMPRESSION, DEBRIDEMENT ROTATOR CUFF REPAIR (Right)  Patient Location: PACU  Anesthesia Type:GA combined with regional for post-op pain  Level of Consciousness: awake, sedated and responds to stimulation  Airway & Oxygen Therapy: Patient Spontanous Breathing and Patient connected to face mask oxygen  Post-op Assessment: Report given to RN and Post -op Vital signs reviewed and stable  Post vital signs: Reviewed and stable  Last Vitals:  Filed Vitals:   04/03/15 1140  BP:   Pulse: 93  Temp:   Resp: 20    Complications: No apparent anesthesia complications

## 2015-04-03 NOTE — H&P (Signed)
PREOPERATIVE H&P  Chief Complaint: Right shoulder rotator cuff tear, impingement  HPI: Shannon Newton is a 49 y.o. female who presents for surgical treatment of Right shoulder rotator cuff tear, impingement.  She denies any changes in medical history.  Past Medical History  Diagnosis Date  . Depression   . Hypertension   . Abdominal pain, recurrent 2009    per CT of the abd/pelvis (01/26/2008) -  Diffuse inflammatory change of the descending and rectosigmoid colon.  Additional inflammatory changes within the terminal ileum raise concern for inflammatory bowel disease (Crohn's disease).  . Hypothyroidism 2003    Thyroid US done in 2003 (results in [place]) - showed increased 24 hour uptake and the findings were consistent with Grave's disease; patient is s/p RAI therapy (09/19/2002)  . Back pain, chronic 2001    per MRI in 2001 - disc protrusion L5-6, to the right  . Complication of anesthesia     hard to wake up-sob-possible sleep apnea  . Urinary frequency   . Sickle cell trait    Past Surgical History  Procedure Laterality Date  . Tubal ligation    . Cystocele repair  2004    done by Dr. Su Grand  . Bladder suspension  2004  . Cystoscopy  2008    revision bladder sling  . Revision urinary sling  2006    sling replaced   History   Social History  . Marital Status: Single    Spouse Name: N/A  . Number of Children: N/A  . Years of Education: N/A   Social History Main Topics  . Smoking status: Never Smoker   . Smokeless tobacco: Never Used  . Alcohol Use: No  . Drug Use: No  . Sexual Activity: Yes   Other Topics Concern  . None   Social History Narrative   Family History  Problem Relation Age of Onset  . Stroke Neg Hx   . Cancer Neg Hx    Allergies  Allergen Reactions  . Advil [Ibuprofen] Swelling  . Aspirin Hives  . Codeine Hives  . Hydrocodone     REACTION: itching  . Penicillins     REACTION: shock   Prior to Admission medications   Medication Sig  Start Date End Date Taking? Authorizing Provider  oxybutynin (DITROPAN-XL) 10 MG 24 hr tablet Take 10 mg by mouth at bedtime.   Yes Historical Provider, MD  amLODipine (NORVASC) 10 MG tablet Take 1 tablet (10 mg total) by mouth daily. 03/11/15 03/10/16  Otis Brace, MD  cyclobenzaprine (FLEXERIL) 5 MG tablet TAKE ONE TABLET BY MOUTH EVERY 8 HOURS AS NEEDED FOR MUSCLE SPASMS 03/11/15   Otis Brace, MD  levothyroxine (SYNTHROID, LEVOTHROID) 88 MCG tablet Take 1 tablet (88 mcg total) by mouth daily before breakfast. 10/16/14   Baltazar Apo, MD  traMADol (ULTRAM) 50 MG tablet Take 1 to 2 tablets by mouth at bedtime 03/11/15   Otis Brace, MD  Vitamin D, Ergocalciferol, (DRISDOL) 50000 UNITS CAPS capsule Take 1 capsule (50,000 Units total) by mouth every 7 (seven) days. 03/13/15   Otis Brace, MD     Positive ROS: All other systems have been reviewed and were otherwise negative with the exception of those mentioned in the HPI and as above.  Physical Exam: General: Alert, no acute distress Cardiovascular: No pedal edema Respiratory: No cyanosis, no use of accessory musculature GI: abdomen soft Skin: No lesions in the area of chief complaint Neurologic: Sensation intact distally Psychiatric: Patient is competent for  consent with normal mood and affect Lymphatic: no lymphedema  MUSCULOSKELETAL: exam stable  Assessment: Right shoulder rotator cuff tear, impingement  Plan: Plan for Procedure(s): RIGHT SHOULDER ARTHROSCOPY WITH SUBACROMIAL DECOMPRESSION, DEBRIDEMENT, POSSIBLE ROTATOR CUFF REPAIR  The risks benefits and alternatives were discussed with the patient including but not limited to the risks of nonoperative treatment, versus surgical intervention including infection, bleeding, nerve injury,  blood clots, cardiopulmonary complications, morbidity, mortality, among others, and they were willing to proceed.   Cheral AlmasXu, Jeslyn Amsler Michael, MD   04/03/2015 7:36 AM

## 2015-04-03 NOTE — Anesthesia Preprocedure Evaluation (Signed)
Anesthesia Evaluation  Patient identified by MRN, date of birth, ID band Patient awake    Reviewed: Allergy & Precautions, NPO status , Patient's Chart, lab work & pertinent test results  Airway Mallampati: I  TM Distance: >3 FB Neck ROM: Full    Dental  (+) Teeth Intact, Dental Advisory Given   Pulmonary  breath sounds clear to auscultation        Cardiovascular hypertension, Pt. on medications Rhythm:Regular Rate:Normal     Neuro/Psych    GI/Hepatic   Endo/Other  Morbid obesity  Renal/GU      Musculoskeletal   Abdominal   Peds  Hematology   Anesthesia Other Findings   Reproductive/Obstetrics                             Anesthesia Physical Anesthesia Plan  ASA: III  Anesthesia Plan: General   Post-op Pain Management:    Induction: Intravenous  Airway Management Planned: Oral ETT  Additional Equipment:   Intra-op Plan:   Post-operative Plan: Extubation in OR  Informed Consent: I have reviewed the patients History and Physical, chart, labs and discussed the procedure including the risks, benefits and alternatives for the proposed anesthesia with the patient or authorized representative who has indicated his/her understanding and acceptance.   Dental advisory given  Plan Discussed with: CRNA, Anesthesiologist and Surgeon  Anesthesia Plan Comments:         Anesthesia Quick Evaluation

## 2015-04-03 NOTE — Progress Notes (Signed)
Assisted Dr. Crews with right, ultrasound guided, interscalene  block. Side rails up, monitors on throughout procedure. See vital signs in flow sheet. Tolerated Procedure well. 

## 2015-04-03 NOTE — Discharge Instructions (Signed)
Postoperative instructions:  Weightbearing: Non weight bearing.  Remain in sling at all times  Keep your dressing and/or splint clean and dry at all times.  You can remove your dressing on post-operative day #3 and change with a dry/sterile dressing or Band-Aids as needed thereafter.    Incision instructions:  Do not soak your incision for 3 weeks after surgery.  If the incision gets wet, pat dry and do not scrub the incision.  Pain control:  You have been given a prescription to be taken as directed for post-operative pain control.  In addition, elevate the operative extremity above the heart at all times to prevent swelling and throbbing pain.  Take over-the-counter Colace, 100mg  by mouth twice a day while taking narcotic pain medications to help prevent constipation.  Follow up appointments: 1) 10-14 days for suture removal and wound check. 2) Dr. Roda ShuttersXu as scheduled.   -------------------------------------------------------------------------------------------------------------  After Surgery Pain Control:  After your surgery, post-surgical discomfort or pain is likely. This discomfort can last several days to a few weeks. At certain times of the day your discomfort may be more intense.  Did you receive a nerve block?  A nerve block can provide pain relief for one hour to two days after your surgery. As long as the nerve block is working, you will experience little or no sensation in the area the surgeon operated on.  As the nerve block wears off, you will begin to experience pain or discomfort. It is very important that you begin taking your prescribed pain medication before the nerve block fully wears off. Treating your pain at the first sign of the block wearing off will ensure your pain is better controlled and more tolerable when full-sensation returns. Do not wait until the pain is intolerable, as the medicine will be less effective. It is better to treat pain in advance than to try and  catch up.  General Anesthesia:  If you did not receive a nerve block during your surgery, you will need to start taking your pain medication shortly after your surgery and should continue to do so as prescribed by your surgeon.  Pain Medication:  Most commonly we prescribe Vicodin and Percocet for post-operative pain. Both of these medications contain a combination of acetaminophen (Tylenol) and a narcotic to help control pain.   It takes between 30 and 45 minutes before pain medication starts to work. It is important to take your medication before your pain level gets too intense.   Nausea is a common side effect of many pain medications. You will want to eat something before taking your pain medicine to help prevent nausea.   If you are taking a prescription pain medication that contains acetaminophen, we recommend that you do not take additional over the counter acetaminophen (Tylenol).  Other pain relieving options:   Using a cold pack to ice the affected area a few times a day (15 to 20 minutes at a time) can help to relieve pain, reduce swelling and bruising.   Elevation of the affected area can also help to reduce pain and swelling.     If you had hand, arm or shoulder surgery you should move your fingers frequently unless otherwise instructed by your doctor.  Follow your doctor's exact instructions for activity at home. Use your home equipment as instructed. (Crutches, hard shoes, slings etc.)  Limit your activity as instructed by your doctor.  Report to your doctor should any of the following occur: 1. Extreme swelling of  your fingers or toes. 2. Inability to wiggle your fingers or toes. 3. Coldness, pale or bluish color in your fingers or toes. 4. Loss of sensation, numbness or tingling of your fingers or toes. 5. Unusual smell or odor from under your dressing or cast. 6. Excessive bleeding or drainage from the surgical site. 7. Pain not relieved by medication your doctor  has prescribed for you. 8. Cast or dressing too tight (do not get your dressing or cast wet or put anything under your dressing or cast.) 9. Fever of 101 or greater. 10. Prolonged nausea/vomiting.  *Do not change your dressing unless instructed by your doctor or discharge nurse. Then follow exact instructions.  *Follow labeled instructions for any medications that your doctor may have prescribed for you. *Should any questions or complications develop following your procedure, PLEASE CONTACT YOUR DOCTOR.  Post Anesthesia Home Care Instructions  Activity: Get plenty of rest for the remainder of the day. A responsible adult should stay with you for 24 hours following the procedure.  For the next 24 hours, DO NOT: -Drive a car -Advertising copywriter -Drink alcoholic beverages -Take any medication unless instructed by your physician -Make any legal decisions or sign important papers.  Meals: Start with liquid foods such as gelatin or soup. Progress to regular foods as tolerated. Avoid greasy, spicy, heavy foods. If nausea and/or vomiting occur, drink only clear liquids until the nausea and/or vomiting subsides. Call your physician if vomiting continues.  Special Instructions/Symptoms: Your throat may feel dry or sore from the anesthesia or the breathing tube placed in your throat during surgery. If this causes discomfort, gargle with warm salt water. The discomfort should disappear within 24 hours.  If you had a scopolamine patch placed behind your ear for the management of post- operative nausea and/or vomiting:  1. The medication in the patch is effective for 72 hours, after which it should be removed.  Wrap patch in a tissue and discard in the trash. Wash hands thoroughly with soap and water. 2. You may remove the patch earlier than 72 hours if you experience unpleasant side effects which may include dry mouth, dizziness or visual disturbances. 3. Avoid touching the patch. Wash your hands  with soap and water after contact with the patch.   Regional Anesthesia Blocks  1. Numbness or the inability to move the "blocked" extremity may last from 3-48 hours after placement. The length of time depends on the medication injected and your individual response to the medication. If the numbness is not going away after 48 hours, call your surgeon.  2. The extremity that is blocked will need to be protected until the numbness is gone and the  Strength has returned. Because you cannot feel it, you will need to take extra care to avoid injury. Because it may be weak, you may have difficulty moving it or using it. You may not know what position it is in without looking at it while the block is in effect.  3. For blocks in the legs and feet, returning to weight bearing and walking needs to be done carefully. You will need to wait until the numbness is entirely gone and the strength has returned. You should be able to move your leg and foot normally before you try and bear weight or walk. You will need someone to be with you when you first try to ensure you do not fall and possibly risk injury.  4. Bruising and tenderness at the needle site are common  side effects and will resolve in a few days.  5. Persistent numbness or new problems with movement should be communicated to the surgeon or the [facility] [phone])/ [facility] [phone]).

## 2015-04-03 NOTE — Op Note (Signed)
Date of surgery: 04/03/2015  Preoperative diagnosis: Right rotator cuff tear  Postoperative diagnosis: Same  Operative findings: 1. Small distal anterior supraspinatus tear 2. Type II SLAP tear, degenerative  Procedure: 1. Right shoulder arthroscopy with rotator cuff repair 2. Right shoulder arthroscopy with extensive debridement 3. Right shoulder arthroscopy with subacromial decompression and acromioplasty  Surgeon: Glee ArvinMichael Altie Newton, M.D.  Anesthesia: Gen. And regional  Estimated blood loss: Minimal  Condition to PACU: Stable  Indications for procedure: [name] is a 49 year old female who presents for surgical treatment of the above-mentioned conditions after failing extensive conservative treatment. She is aware of the risks, benefits, alternatives to surgery and she elected to proceed. She signed the consent prior to the surgery.  Description of procedure: The patient was identified in the preoperative holding area. The operative site was marked by the surgeon confirmed with the patient. She was brought back to the operating room. She was placed supine on table. General anesthesia was induced. She was then placed in the lateral cubitus position with the bean bookbag. All bony prominences were well-padded. SCDs were placed on the lower legs. The right upper extremity was prepped and draped in standard sterile fashion and suspended in a fishing pole mechanism. A timeout was performed and preoperative antibiosis were given. Standard posterior shoulder arthroscopic portal was established. Once inside the joint we created an anterior portal using an inside out technique. Once we had the 2 portals established we visualized the joint. The glenohumeral surface was without any signs of chondromalacia. There was a small degenerative type II SLAP tear which was debrided using oscillating shaver. There is also a small amount of synovitis within the shoulder joint which was also debrided with the  oscillating shaver. We visualized the articular portion of the rotator cuff and revealed a small distal supraspinatus tear. We then moved into the subacromial space. The subacromial bursa was first debrided using the oscillating shaver. We then used a ArthroCare wand to establish the undersurface and the borders of the acromion. Acromioplasty was performed using a high-speed burr until the undersurface was flat. We then debrided the torn edge of the rotator cuff. We also burred the footprint of the rotator cuff on the greater tuberosity. Once we had the surfaces adequately prepared with performed an arthroscopic rotator cuff using FiberWire tape in a horizontal mattress fashion. This was anchored down into the greater tuberosity under arthroscopic visualization by using a bio composite swivel lock. The rotator cuff edge was brought laterally back to where the original footprint was. Final arthroscopic pictures were taken. The portals were closed with interrupted nylon sutures. Sterile dressings were applied. Patient's arm was immobilized in a shoulder immobilizer. Patient was x-rayed and transferred to the PACU in stable condition. All sponge counts were correct. Next  Postoperative diagnosis: Patient will be nonweightbearing to the right upper extremity. We will begin gentle range of motion in physical therapy within 1-2 weeks.  Shannon ReelN. Shannon Carson Meche, MD [facility] [phone] 1:26 PM

## 2015-04-05 ENCOUNTER — Encounter (HOSPITAL_BASED_OUTPATIENT_CLINIC_OR_DEPARTMENT_OTHER): Payer: Self-pay | Admitting: Orthopaedic Surgery

## 2015-04-08 ENCOUNTER — Encounter (HOSPITAL_BASED_OUTPATIENT_CLINIC_OR_DEPARTMENT_OTHER): Payer: Self-pay | Admitting: Orthopaedic Surgery

## 2015-04-17 ENCOUNTER — Ambulatory Visit: Payer: No Typology Code available for payment source

## 2015-04-22 ENCOUNTER — Ambulatory Visit: Payer: No Typology Code available for payment source | Attending: Orthopaedic Surgery | Admitting: Physical Therapy

## 2015-04-22 DIAGNOSIS — M25611 Stiffness of right shoulder, not elsewhere classified: Secondary | ICD-10-CM | POA: Insufficient documentation

## 2015-04-22 DIAGNOSIS — M25511 Pain in right shoulder: Secondary | ICD-10-CM | POA: Insufficient documentation

## 2015-04-22 DIAGNOSIS — R29898 Other symptoms and signs involving the musculoskeletal system: Secondary | ICD-10-CM | POA: Insufficient documentation

## 2015-04-22 NOTE — Patient Instructions (Addendum)
   Kristoffer Leamon PT, DPT, LAT, ATC  [facility] Phone: [phone]     

## 2015-04-22 NOTE — Therapy (Signed)
[facility] [address] [place], [place], [address] Phone: [phone]   Fax:  [phone]  Physical Therapy Evaluation  Patient Details  Name: Shannon Newton MRN: [id number] Date of Birth: 07-Mar-1966 Referring Provider:  Tarry Kos, MD  Encounter Date: 04/22/2015      PT End of Session - 04/22/15 1157    Visit Number 1   Number of Visits 16   Date for PT Re-Evaluation 06/17/15   PT Start Time 1100   PT Stop Time 1145   PT Time Calculation (min) 45 min   Activity Tolerance Patient limited by pain   Behavior During Therapy [facility] for tasks assessed/performed      Past Medical History  Diagnosis Date  . Depression   . Hypertension   . Abdominal pain, recurrent 2009    per CT of the abd/pelvis (01/26/2008) -  Diffuse inflammatory change of the descending and rectosigmoid colon.  Additional inflammatory changes within the terminal ileum raise concern for inflammatory bowel disease (Crohn's disease).  . Hypothyroidism 2003    Thyroid US done in 2003 (results in [place]) - showed increased 24 hour uptake and the findings were consistent with Grave's disease; patient is s/p RAI therapy (09/19/2002)  . Back pain, chronic 2001    per MRI in 2001 - disc protrusion L5-6, to the right  . Complication of anesthesia     hard to wake up-sob-possible sleep apnea  . Urinary frequency   . Sickle cell trait     Past Surgical History  Procedure Laterality Date  . Tubal ligation    . Cystocele repair  2004    done by Dr. Su Grand  . Bladder suspension  2004  . Cystoscopy  2008    revision bladder sling  . Revision urinary sling  2006    sling replaced  . Shoulder arthroscopy with subacromial decompression Right 04/03/2015    Procedure: RIGHT SHOULDER ARTHROSCOPY WITH SUBACROMIAL DECOMPRESSION, DEBRIDEMENT ;  Surgeon: Tarry Kos, MD;  Location: [facility];  Service: Orthopedics;  Laterality: Right;    There were no  vitals filed for this visit.  Visit Diagnosis:  Pain in joint, shoulder region, right - Plan: PT plan of care cert/re-cert  Shoulder stiffness, right - Plan: PT plan of care cert/re-cert  Weakness of right arm - Plan: PT plan of care cert/re-cert  Decreased ROM of right shoulder - Plan: PT plan of care cert/re-cert      Subjective Assessment - 04/22/15 1106    Subjective pt is a 48 y.o with R shoulder pain following R shoulder rotator cuff repair on 04/04/2015. Shannon Newton states it was from helping lifting a pt  and reported that it may be from taking care of them . Shannon Newton reports that the pain has gotten better since the surgery, and reports the most pain during sleeping.    Limitations Lifting;House hold activities   How long can you sit comfortably? unlimited   How long can you stand comfortably? 10-25 min (worse since the surgery)   How long can you walk comfortably? 10-15 min (worse since the surgery)   Diagnostic tests MRI on 12/16/2014 impression was rotator cuff.    Currently in Pain? Yes   Pain Score 0-No pain  last took pain medication last night   Pain Location Shoulder   Pain Orientation Right   Pain Descriptors / Indicators Aching   Pain Type Surgical pain   Pain Onset More than a month ago  Pain Frequency Intermittent   Aggravating Factors  sometimes just sitting, laying down, cold breeze, moving around.    Pain Relieving Factors stop and rest, heat            [facility] PT Assessment - 04/22/15 1113    Assessment   Medical Diagnosis S/P R rotator cuff repair   small cuff repair   Onset Date 04/04/15   Next MD Visit 05/06/2015   Prior Therapy yes   Precautions   Precautions Shoulder   Balance Screen   Has the patient fallen in the past 6 months No   Has the patient had a decrease in activity level because of a fear of falling?  No   Is the patient reluctant to leave their home because of a fear of falling?  No   Observation/Other Assessments   Focus on Therapeutic  Outcomes (FOTO)  81% limited  Predicted 43%    Posture/Postural Control   Posture/Postural Control Postural limitations   Postural Limitations Rounded Shoulders;Forward head   ROM / Strength   AROM / PROM / Strength PROM;AROM;Strength   PROM   Overall PROM Comments L side WFL   PROM Assessment Site Shoulder   Right Shoulder Extension 10 Degrees  with pain during motion   Right Shoulder Flexion 20 Degrees  with pain at endrange   Right Shoulder ABduction 22 Degrees  pain at endrange   Right Shoulder Internal Rotation --  not tested due to pain   Right Shoulder External Rotation --  not tested due to pain   Strength   Grip (lbs) 70  L   Grip (lbs) 32  R   Palpation   Palpation tenderness noted all around the R shoulder with some referred pain to the bicep.                    [facility] Adult PT Treatment/Exercise - 04/22/15 1113    Elbow Exercises   Elbow Flexion AROM;Strengthening;Right;10 reps   Elbow Flexion Limitations pain during movment and at end range  performed in sitting   Other elbow exercises gripping x 10 with 3 sec hold  noted pain during exercise in the R shoulder in sitting   Shoulder Exercises: Seated   Flexion PROM;Right  with wand x 10   Flexion Limitations pain during PROM and significant R shoulder guarding   Abduction PROM;Right;10 reps  with wand    ABduction Limitations pain during PROM and significant R shoulder guarding   Shoulder Exercises: ROM/Strengthening   Pendulum circles 2 x 1 min  tolerated exercise with some pain while hanging                PT Education - 04/22/15 1157    Education provided Yes   Education Details evaluation findings, POC, goals, HEP,    Person(s) Educated Patient   Methods Explanation   Comprehension Verbalized understanding          PT Short Term Goals - 04/22/15 1204    PT SHORT TERM GOAL #1   Title pt will be I with basic HEP 05/19/2015   Time 4   Period Weeks   Status New   PT SHORT  TERM GOAL #2   Title pt will decrease pain to <5/10 during PROM/AAROM to assist with exercise progression 05/19/2015   Baseline 9/10 during movement   Time 4   Period Weeks   Status New   PT SHORT TERM GOAL #3   Title pt will Increase R  shoulder PROM flexion and abduction by >20 degrees to asisst with ADLs 05/19/2015   Baseline flexion 20 degrees, abduction 22 degress   Time 4   Period Weeks   Status New   PT SHORT TERM GOAL #4   Title Shannon Newton will be able to verbalize and demonstrate techniques to control R shoulder inflammation via RICE method 05/19/2015   Time 4   Period Weeks   Status New   PT SHORT TERM GOAL #5   Title Shannon Newton will increase her FOTO score by > 10 points to help with functional capacity 05/19/2015   Time 4   Period Weeks   Status New           PT Long Term Goals - 04/22/15 1208    PT LONG TERM GOAL #1   Title Shannon Newton will be I with advanced HEP (06/17/15)   Time 8   Period Weeks   Status New   PT LONG TERM GOAL #2   Title Shannon Newton will demonstrate < 3/10 pain during and following AROM of the R shoulder to assist with ADLs (06/17/15)   Time 8   Period Weeks   Status New   PT LONG TERM GOAL #3   Title Shannon Newton will demonstrate functional R shoulder AROM compared bil to assist with personal hygiene and ADLs (06/17/15)   Time 8   Period Weeks   Status New   PT LONG TERM GOAL #4   Title Shannon Newton will increase R grip strength to > 60 # to help with funcitonal exercise progression (06/17/15)   Time 8   Period Weeks   Status New   PT LONG TERM GOAL #5   Title Shannon Newton will be able to verbalize and demonstrate techniques to reduce risk or R shoulder reinjury via lifting and carry mechanics, and HEP (06/17/15)   Time 8   Period Weeks   Status New               Plan - 04/22/15 1158    Clinical Impression Statement Shannon Newton presents to OPPT with CC of R shoulder pain following R RCR on 04/04/2015.  Shannon Newton presents with limited PROM with 20 degrees of flexion, and 22 abduction with pain during  movement and at end range with signifcant muscle guarding. Passive IR/ER werent assessed today due to pain. Shannon Newton reported no pain during sitting with her sling, but reported 9/10 during PROM exercises. Grip strength is limited to 32# compared to 70# on the L. following PROM assessment Shannon Newton reported feeling "weak and a little light headed" that was fleeting which Shannon Newton stated was secondary to pain. pt goals are to be done with therapy in a few weeks.  Shannon Newton would benefit from skilled physical therapy to maximize her function and by addressing the impairments listed.    Pt will benefit from skilled therapeutic intervention in order to improve on the following deficits Decreased range of motion;Impaired flexibility;Improper body mechanics;Postural dysfunction;Pain;Impaired UE functional use;Increased muscle spasms;Decreased scar mobility;Decreased endurance;Increased edema;Decreased activity tolerance;Increased fascial restricitons;Decreased strength;Decreased mobility   Rehab Potential Good   PT Frequency 2x / week   PT Duration 8 weeks   PT Treatment/Interventions ADLs/Self Care Home Management;Moist Heat;Therapeutic activities;Patient/family education;Scar mobilization;Visual/perceptual remediation/compensation;Therapeutic exercise;Passive range of motion;Dry needling;Manual techniques;Ultrasound;Cryotherapy;Electrical Stimulation;Neuromuscular re-education   PT Next Visit Plan assess repsonse to HEP, PROM of R shoulder, AROM of elbow/forearm and gripping, modalities for pain PRN.    PT Home Exercise Plan pendulums, wand PROM flexion and abduction (while seated), gripping, and active elbow  flexion.    Consulted and Agree with Plan of Care Patient         Problem List Patient Active Problem List   Diagnosis Date Noted  . Vitamin D deficiency 03/13/2015  . Urinary incontinence 02/25/2015  . Rotator cuff arthropathy 06/21/2014  . Health care maintenance 06/21/2014  . Hypertension 06/21/2014  . OSA  (obstructive sleep apnea) 07/19/2013  . Obese 07/19/2013  . OSTEOARTHRITIS 12/03/2006  . Hypothyroidism 10/21/2006  . DEPRESSION 10/21/2006   Shannon Newton PT, DPT, LAT, ATC  04/22/2015  12:20 PM      [facility] [address] [place], [place], [address] Phone: [phone]   Fax:  [phone]

## 2015-04-23 ENCOUNTER — Ambulatory Visit: Payer: No Typology Code available for payment source | Admitting: Physical Therapy

## 2015-04-29 ENCOUNTER — Encounter: Payer: Self-pay | Admitting: *Deleted

## 2015-05-02 ENCOUNTER — Ambulatory Visit: Payer: No Typology Code available for payment source | Admitting: Physical Therapy

## 2015-05-02 DIAGNOSIS — R29898 Other symptoms and signs involving the musculoskeletal system: Secondary | ICD-10-CM

## 2015-05-02 DIAGNOSIS — M25611 Stiffness of right shoulder, not elsewhere classified: Secondary | ICD-10-CM

## 2015-05-02 DIAGNOSIS — M25511 Pain in right shoulder: Secondary | ICD-10-CM

## 2015-05-02 NOTE — Patient Instructions (Addendum)
Strengthening: Isometric Flexion  Using wall for resistance, press right fist into ball using light pressure. Hold _5___ seconds. Repeat _15___ times per set. Do __1__ sets per session. Do _2___ sessions per day.  SHOULDER: Abduction (Isometric)  Use wall as resistance. Press arm against pillow. Keep elbow straight. Hold _5__ seconds. ELBOW CAN BE BENT __15_ reps per set, __2_ sets per day, __7_ days per week  Extension (Isometric)  Place left bent elbow and back of arm against wall. Press elbow against wall. Hold 5____ seconds. Repeat ___15_ times. Do _2___ sessions per day.  Internal Rotation (Isometric)  Place palm of right fist against door frame, with elbow bent. Press fist against door frame. Hold __5__ seconds. Repeat __15__ times. Do _2___ sessions per day.  External Rotation (Isometric)  Place back of left fist against door frame, with elbow bent. Press fist against door frame. Hold _5___ seconds. Repeat 15____ times. Do _2__ sessions per day.  Copyright  VHI. All rights reserved.    Cane Exercise: Flexion   Lie on back, holding cane above chest. Keeping arms as straight as possible, lower cane toward floor beyond head. Hold __5__ seconds. Repeat ___10_ times. Do __2__ sessions per day.  http://gt2.exer.us/91   Copyright  VHI. All rights reserved.

## 2015-05-02 NOTE — Therapy (Signed)
[facility] [address] [place], [place], [address] Phone: [phone]   Fax:  [phone]  Physical Therapy Treatment  Patient Details  Name: Shannon Newton MRN: [id number] Date of Birth: 19-Jan-1966 Referring Provider:  Dow AdolphKazibwe, Richard, MD  Encounter Date: 05/02/2015      PT End of Session - 05/02/15 1147    Visit Number 2   Number of Visits 16   Date for PT Re-Evaluation 06/17/15   PT Start Time 1102   PT Stop Time 1155   PT Time Calculation (min) 53 min      Past Medical History  Diagnosis Date  . Depression   . Hypertension   . Abdominal pain, recurrent 2009    per CT of the abd/pelvis (01/26/2008) -  Diffuse inflammatory change of the descending and rectosigmoid colon.  Additional inflammatory changes within the terminal ileum raise concern for inflammatory bowel disease (Crohn's disease).  . Hypothyroidism 2003    [place] - showed increased 24 hour uptake and the findings were consistent with Grave's disease; patient is s/p RAI therapy (09/19/2002)  . Back pain, chronic 2001    per MRI in 2001 - disc protrusion L5-6, to the right  . Complication of anesthesia     hard to wake up-sob-possible sleep apnea  . Urinary frequency   . Sickle cell trait     Past Surgical History  Procedure Laterality Date  . Tubal ligation    . Cystocele repair  2004    done by Dr. Su GrandMarc Nesi  . Bladder suspension  2004  . Cystoscopy  2008    revision bladder sling  . Revision urinary sling  2006    sling replaced  . Shoulder arthroscopy with subacromial decompression Right 04/03/2015    Procedure: RIGHT SHOULDER ARTHROSCOPY WITH SUBACROMIAL DECOMPRESSION, DEBRIDEMENT ;  Surgeon: Tarry KosNaiping M Xu, MD;  Location: [facility];  Service: Orthopedics;  Laterality: Right;    There were no vitals filed for this visit.  Visit Diagnosis:  Pain in joint, shoulder region, right  Shoulder  stiffness, right  Weakness of right arm  Decreased ROM of right shoulder      Subjective Assessment - 05/02/15 1129    Subjective no pain at rest   Currently in Pain? No/denies            [facility] - 05/02/15 1123    PROM   Right Shoulder Flexion 130 Degrees   Right Shoulder ABduction 90 Degrees   Right Shoulder Internal Rotation 55 Degrees   Right Shoulder External Rotation 40 Degrees                     [facility] Adult PT Treatment/Exercise - 05/02/15 1126    Shoulder Exercises: Supine   Other Supine Exercises supine cane pressups, pullovers, ER x 10 each   Shoulder Exercises: Standing   Other Standing Exercises isometrics all planes 5 sec x 10 with bent elbow against towel   Modalities   Modalities Cryotherapy   Cryotherapy   Number Minutes Cryotherapy 15 Minutes   Cryotherapy Location Shoulder   Type of Cryotherapy Ice pack   Manual Therapy   Manual Therapy Passive ROM   Passive ROM Flexion, abduction, ER, IR to tolerance                PT Education - 05/02/15 1150    Education provided Yes   Education Details supine cane  pullover and isometrics all planes   Person(s) Educated Patient   Methods Explanation;Handout   Comprehension Verbalized understanding          PT Short Term Goals - 04/22/15 1204    PT SHORT TERM GOAL #1   Title pt will be I with basic HEP 05/19/2015   Time 4   Period Weeks   Status New   PT SHORT TERM GOAL #2   Title pt will decrease pain to <5/10 during PROM/AAROM to assist with exercise progression 05/19/2015   Baseline 9/10 during movement   Time 4   Period Weeks   Status New   PT SHORT TERM GOAL #3   Title pt will Increase R shoulder PROM flexion and abduction by >20 degrees to asisst with ADLs 05/19/2015   Baseline flexion 20 degrees, abduction 22 degress   Time 4   Period Weeks   Status New   PT SHORT TERM GOAL #4   Title She will be able to verbalize and demonstrate techniques to control R  shoulder inflammation via RICE method 05/19/2015   Time 4   Period Weeks   Status New   PT SHORT TERM GOAL #5   Title She will increase her FOTO score by > 10 points to help with functional capacity 05/19/2015   Time 4   Period Weeks   Status New           PT Long Term Goals - 04/22/15 1208    PT LONG TERM GOAL #1   Title She will be I with advanced HEP (06/17/15)   Time 8   Period Weeks   Status New   PT LONG TERM GOAL #2   Title She will demonstrate < 3/10 pain during and following AROM of the R shoulder to assist with ADLs (06/17/15)   Time 8   Period Weeks   Status New   PT LONG TERM GOAL #3   Title She will demonstrate functional R shoulder AROM compared bil to assist with personal hygiene and ADLs (06/17/15)   Time 8   Period Weeks   Status New   PT LONG TERM GOAL #4   Title She will increase R grip strength to > 60 # to help with funcitonal exercise progression (06/17/15)   Time 8   Period Weeks   Status New   PT LONG TERM GOAL #5   Title she will be able to verbalize and demonstrate techniques to reduce risk or R shoulder reinjury via lifting and carry mechanics, and HEP (06/17/15)   Time 8   Period Weeks   Status New               Plan - 05/02/15 1204    Clinical Impression Statement Pt demonstrates significant improvements in PROM since evaluation. She has been performing her HEP and rates her pain at 0/10 when at rest and only 2/10 with PROM AAROM and isometrics today. Added supine AAROM and isometrics to her HEP.   PT Next Visit Plan continue 4 weeks s/p small RTC repair        Problem List Patient Active Problem List   Diagnosis Date Noted  . Vitamin D deficiency 03/13/2015  . Urinary incontinence 02/25/2015  . Rotator cuff arthropathy 06/21/2014  . Health care maintenance 06/21/2014  . Hypertension 06/21/2014  . OSA (obstructive sleep apnea) 07/19/2013  . Obese 07/19/2013  . OSTEOARTHRITIS 12/03/2006  . Hypothyroidism 10/21/2006  . DEPRESSION  10/21/2006    Sherrie Mustache,  PTA 05/02/2015, 12:07 PM  [facility] [address] [place], [place], [address] Phone: [phone]   Fax:  [phone]

## 2015-05-07 ENCOUNTER — Ambulatory Visit: Payer: No Typology Code available for payment source | Admitting: Physical Therapy

## 2015-05-07 DIAGNOSIS — M25611 Stiffness of right shoulder, not elsewhere classified: Secondary | ICD-10-CM

## 2015-05-07 DIAGNOSIS — R29898 Other symptoms and signs involving the musculoskeletal system: Secondary | ICD-10-CM

## 2015-05-07 DIAGNOSIS — M25511 Pain in right shoulder: Secondary | ICD-10-CM

## 2015-05-07 NOTE — Therapy (Signed)
[facility] [address] [place], [place], [address] Phone: [phone]   Fax:  [phone]  Physical Therapy Treatment  Patient Details  Name: Shannon Newton MRN: [id number] Date of Birth: 03-17-1966 Referring Provider:  Dow Adolph, MD  Encounter Date: 05/07/2015      PT End of Session - 05/07/15 1006    Visit Number 3   Number of Visits 16   Date for PT Re-Evaluation 06/17/15   PT Start Time 0731   PT Stop Time 0816   PT Time Calculation (min) 45 min   Activity Tolerance Patient tolerated treatment well;Patient limited by pain   Behavior During Therapy [facility] for tasks assessed/performed      Past Medical History  Diagnosis Date  . Depression   . Hypertension   . Abdominal pain, recurrent 2009    per CT of the abd/pelvis (01/26/2008) -  Diffuse inflammatory change of the descending and rectosigmoid colon.  Additional inflammatory changes within the terminal ileum raise concern for inflammatory bowel disease (Crohn's disease).  . Hypothyroidism 2003    Thyroid US done in 2003 (results in [place]) - showed increased 24 hour uptake and the findings were consistent with Grave's disease; patient is s/p RAI therapy (09/19/2002)  . Back pain, chronic 2001    per MRI in 2001 - disc protrusion L5-6, to the right  . Complication of anesthesia     hard to wake up-sob-possible sleep apnea  . Urinary frequency   . Sickle cell trait     Past Surgical History  Procedure Laterality Date  . Tubal ligation    . Cystocele repair  2004    done by Dr. Su Grand  . Bladder suspension  2004  . Cystoscopy  2008    revision bladder sling  . Revision urinary sling  2006    sling replaced  . Shoulder arthroscopy with subacromial decompression Right 04/03/2015    Procedure: RIGHT SHOULDER ARTHROSCOPY WITH SUBACROMIAL DECOMPRESSION, DEBRIDEMENT ;  Surgeon: Tarry Kos, MD;  Location: [facility];  Service: Orthopedics;   Laterality: Right;    There were no vitals filed for this visit.  Visit Diagnosis:  Pain in joint, shoulder region, right  Shoulder stiffness, right  Weakness of right arm  Decreased ROM of right shoulder                       [facility] Adult PT Treatment/Exercise - 05/07/15 0001    Elbow Exercises   Elbow Flexion --  PROM  Stiff end range. Slight decrease Range   Elbow Extension --  PROM, Full ROM, stiff end range   Forearm Supination Limitations --  PROM  , ROM WNL   Shoulder Exercises: Seated   Other Seated Exercises Isometric extension, Flexion 10 reps each 5 second holds    Moist Heat Therapy   Number Minutes Moist Heat 15 Minutes   Moist Heat Location Shoulder  RT   Manual Therapy   Manual Therapy Soft tissue mobilization;Passive ROM  flexion 125 degrees.  upper traps neck peri scapular   Manual therapy comments neck axilla lymph syaten vacume activation.   Soft tissue mobilization upper trap  Scat tissue mobilization, lymph system vacume activation,   Passive ROM flexion                PT Education - 05/07/15 1006    Education provided Yes   Education Details lymph system vacume activation technique   Person(s)  Educated Patient   Methods Explanation;Demonstration;Tactile cues;Verbal cues   Comprehension Verbalized understanding;Returned demonstration          PT Short Term Goals - 04/22/15 1204    PT SHORT TERM GOAL #1   Title pt will be I with basic HEP 05/19/2015   Time 4   Period Weeks   Status New   PT SHORT TERM GOAL #2   Title pt will decrease pain to <5/10 during PROM/AAROM to assist with exercise progression 05/19/2015   Baseline 9/10 during movement   Time 4   Period Weeks   Status New   PT SHORT TERM GOAL #3   Title pt will Increase R shoulder PROM flexion and abduction by >20 degrees to asisst with ADLs 05/19/2015   Baseline flexion 20 degrees, abduction 22 degress   Time 4   Period Weeks   Status New   PT SHORT TERM  GOAL #4   Title She will be able to verbalize and demonstrate techniques to control R shoulder inflammation via RICE method 05/19/2015   Time 4   Period Weeks   Status New   PT SHORT TERM GOAL #5   Title She will increase her FOTO score by > 10 points to help with functional capacity 05/19/2015   Time 4   Period Weeks   Status New           PT Long Term Goals - 04/22/15 1208    PT LONG TERM GOAL #1   Title She will be I with advanced HEP (06/17/15)   Time 8   Period Weeks   Status New   PT LONG TERM GOAL #2   Title She will demonstrate < 3/10 pain during and following AROM of the R shoulder to assist with ADLs (06/17/15)   Time 8   Period Weeks   Status New   PT LONG TERM GOAL #3   Title She will demonstrate functional R shoulder AROM compared bil to assist with personal hygiene and ADLs (06/17/15)   Time 8   Period Weeks   Status New   PT LONG TERM GOAL #4   Title She will increase R grip strength to > 60 # to help with funcitonal exercise progression (06/17/15)   Time 8   Period Weeks   Status New   PT LONG TERM GOAL #5   Title she will be able to verbalize and demonstrate techniques to reduce risk or R shoulder reinjury via lifting and carry mechanics, and HEP (06/17/15)   Time 8   Period Weeks   Status New               Plan - 05/07/15 1007    Clinical Impression Statement Good range, tissue congested and sore,  Better post manual today.     PT Next Visit Plan continue 4 weeks s/p small RTC repair.  Taping for edema?     Consulted and Agree with Plan of Care Patient        Problem List Patient Active Problem List   Diagnosis Date Noted  . Vitamin D deficiency 03/13/2015  . Urinary incontinence 02/25/2015  . Rotator cuff arthropathy 06/21/2014  . Health care maintenance 06/21/2014  . Hypertension 06/21/2014  . OSA (obstructive sleep apnea) 07/19/2013  . Obese 07/19/2013  . OSTEOARTHRITIS 12/03/2006  . Hypothyroidism 10/21/2006  . DEPRESSION 10/21/2006     Jonahtan Manseau 05/07/2015, 10:10 AM  [facility] [address] [place], [place], [address] Phone:  [phone]   Fax:  [phone]  Liz BeachKaren Jemma Rasp, PTA 05/07/2015 10:10 AM Phone: [phone] Fax: [phone]

## 2015-05-09 ENCOUNTER — Ambulatory Visit: Payer: No Typology Code available for payment source | Admitting: Physical Therapy

## 2015-05-09 DIAGNOSIS — M25611 Stiffness of right shoulder, not elsewhere classified: Secondary | ICD-10-CM

## 2015-05-09 DIAGNOSIS — M25511 Pain in right shoulder: Secondary | ICD-10-CM

## 2015-05-09 DIAGNOSIS — R29898 Other symptoms and signs involving the musculoskeletal system: Secondary | ICD-10-CM

## 2015-05-09 NOTE — Therapy (Signed)
[facility] Outpatient Rehabilitation [facility] [address] [place], [place], [address] Phone: [phone]   Fax:  [phone]  Physical Therapy Treatment  Patient Details  Name: Shannon Newton MRN: [id number] Date of Birth: 1966/10/06 Referring Provider:  Dow Adolph, MD  Encounter Date: 05/09/2015      PT End of Session - 05/09/15 1302    Visit Number 4   Number of Visits 16   Date for PT Re-Evaluation 06/17/15   PT Start Time 1018   PT Stop Time 1109   PT Time Calculation (min) 51 min   Activity Tolerance Patient tolerated treatment well   Behavior During Therapy [facility] for tasks assessed/performed      Past Medical History  Diagnosis Date  . Depression   . Hypertension   . Abdominal pain, recurrent 2009    per CT of the abd/pelvis (01/26/2008) -  Diffuse inflammatory change of the descending and rectosigmoid colon.  Additional inflammatory changes within the terminal ileum raise concern for inflammatory bowel disease (Crohn's disease).  . Hypothyroidism 2003    Thyroid US done in 2003 (results in [place]) - showed increased 24 hour uptake and the findings were consistent with Grave's disease; patient is s/p RAI therapy (09/19/2002)  . Back pain, chronic 2001    per MRI in 2001 - disc protrusion L5-6, to the right  . Complication of anesthesia     hard to wake up-sob-possible sleep apnea  . Urinary frequency   . Sickle cell trait     Past Surgical History  Procedure Laterality Date  . Tubal ligation    . Cystocele repair  2004    done by Dr. Su Grand  . Bladder suspension  2004  . Cystoscopy  2008    revision bladder sling  . Revision urinary sling  2006    sling replaced  . Shoulder arthroscopy with subacromial decompression Right 04/03/2015    Procedure: RIGHT SHOULDER ARTHROSCOPY WITH SUBACROMIAL DECOMPRESSION, DEBRIDEMENT ;  Surgeon: Tarry Kos, MD;  Location:  [facility];  Service: Orthopedics;  Laterality: Right;     There were no vitals filed for this visit.  Visit Diagnosis:  Pain in joint, shoulder region, right  Shoulder stiffness, right  Weakness of right arm  Decreased ROM of right shoulder      Subjective Assessment - 05/09/15 1028    Subjective "I have been trying to do more around the house and could actually lift the arm and let it rest on my sink" I   Currently in Pain? Yes   Pain Location Shoulder   Pain Orientation Right   Pain Descriptors / Indicators Aching   Pain Type Surgical pain   Pain Onset More than a month ago   Pain Frequency Intermittent   Aggravating Factors  laying down and sleeping,    Pain Relieving Factors stop what she is doing,             [facility] PT Assessment - 05/09/15 0001    PROM   Right Shoulder Flexion 135 Degrees  following joint mobilization                     [facility] Adult PT Treatment/Exercise - 05/09/15 1031    Shoulder Exercises: Standing   Other Standing Exercises isometrics all planes 5 sec x 10 with bent elbow against towel   Moist Heat Therapy   Number Minutes Moist Heat 15 Minutes   Moist Heat Location Shoulder  rt  Manual Therapy   Manual Therapy Soft tissue mobilization;Passive ROM;Joint mobilization   Joint Mobilization GH and scapular mobilizations in all planes grade 2-3    Soft tissue mobilization upper trap                PT Education - 05/09/15 1304    Education provided Yes   Education Details getting a trigger point release item at target with a ball on the end to help release upper trap tightness   Person(s) Educated Patient   Methods Explanation   Comprehension Verbalized understanding          PT Short Term Goals - 05/09/15 1305    PT SHORT TERM GOAL #1   Title pt will be I with basic HEP 05/19/2015   Time 4   Period Weeks   Status On-going   PT SHORT TERM GOAL #2   Title pt will decrease pain to <5/10 during PROM/AAROM to assist with exercise progression 05/19/2015   Baseline  9/10 during movement   Time 4   Period Weeks   Status On-going   PT SHORT TERM GOAL #3   Title pt will Increase R shoulder PROM flexion and abduction by >20 degrees to asisst with ADLs 05/19/2015   Baseline flexion 20 degrees, abduction 22 degress   Time 4   Period Weeks   Status On-going   PT SHORT TERM GOAL #4   Title She will be able to verbalize and demonstrate techniques to control R shoulder inflammation via RICE method 05/19/2015   Time 4   Period Weeks   Status On-going   PT SHORT TERM GOAL #5   Title She will increase her FOTO score by > 10 points to help with functional capacity 05/19/2015   Time 4   Period Weeks   Status On-going           PT Long Term Goals - 05/09/15 1305    PT LONG TERM GOAL #1   Title She will be I with advanced HEP (06/17/15)   Time 8   Period Weeks   Status On-going   PT LONG TERM GOAL #2   Title She will demonstrate < 3/10 pain during and following AROM of the R shoulder to assist with ADLs (06/17/15)   Time 8   Period Weeks   Status On-going   PT LONG TERM GOAL #3   Title She will demonstrate functional R shoulder AROM compared bil to assist with personal hygiene and ADLs (06/17/15)   Time 8   Period Weeks   Status On-going   PT LONG TERM GOAL #4   Title She will increase R grip strength to > 60 # to help with funcitonal exercise progression (06/17/15)   Time 8   Period Weeks   Status On-going   PT LONG TERM GOAL #5   Title she will be able to verbalize and demonstrate techniques to reduce risk or R shoulder reinjury via lifting and carry mechanics, and HEP (06/17/15)   Time 8   Period Weeks   Status On-going               Plan - 05/09/15 1303    Clinical Impression Statement Lurie presents to therapy today stating that she is feeling like she is doing better. She was able to get 135 degrees of passive flexion this visit while in supine.  Focused todays visit on manual treatment to help increased her AROM and decrease muscle  tightness.    PT  Next Visit Plan continue 4 weeks s/p small RTC repair.  Taping for edema?    PT Home Exercise Plan triiger point release tool   Consulted and Agree with Plan of Care Patient        Problem List Patient Active Problem List   Diagnosis Date Noted  . Vitamin D deficiency 03/13/2015  . Urinary incontinence 02/25/2015  . Rotator cuff arthropathy 06/21/2014  . Health care maintenance 06/21/2014  . Hypertension 06/21/2014  . OSA (obstructive sleep apnea) 07/19/2013  . Obese 07/19/2013  . OSTEOARTHRITIS 12/03/2006  . Hypothyroidism 10/21/2006  . DEPRESSION 10/21/2006   Lulu RidingKristoffer Kairen Hallinan PT, DPT, LAT, ATC  05/09/2015  1:07 PM   [facility] [address] [place], [place], [address] Phone: [phone]   Fax:  [phone]

## 2015-05-15 ENCOUNTER — Ambulatory Visit: Payer: No Typology Code available for payment source | Attending: Orthopaedic Surgery | Admitting: Physical Therapy

## 2015-05-15 DIAGNOSIS — M25611 Stiffness of right shoulder, not elsewhere classified: Secondary | ICD-10-CM | POA: Insufficient documentation

## 2015-05-15 DIAGNOSIS — R29898 Other symptoms and signs involving the musculoskeletal system: Secondary | ICD-10-CM | POA: Insufficient documentation

## 2015-05-15 DIAGNOSIS — M25511 Pain in right shoulder: Secondary | ICD-10-CM | POA: Insufficient documentation

## 2015-05-15 NOTE — Patient Instructions (Signed)
Remove Tape if irritating.

## 2015-05-15 NOTE — Therapy (Signed)
[facility] Outpatient Rehabilitation [facility] [address] [place], [place], [address] Phone: [phone]   Fax:  [phone]  Physical Therapy Treatment  Patient Details  Name: NADYNE GARIEPY MRN: [id number] Date of Birth: 1966-08-01 Referring Provider:  Dow Adolph, MD  Encounter Date: 05/15/2015      PT End of Session - 05/15/15 1048    Visit Number 5   Number of Visits 16   Date for PT Re-Evaluation 06/17/15   PT Start Time 0935   PT Stop Time 1030   PT Time Calculation (min) 55 min   Activity Tolerance Patient tolerated treatment well;Patient limited by pain   Behavior During Therapy [facility] for tasks assessed/performed      Past Medical History  Diagnosis Date  . Depression   . Hypertension   . Abdominal pain, recurrent 2009    per CT of the abd/pelvis (01/26/2008) -  Diffuse inflammatory change of the descending and rectosigmoid colon.  Additional inflammatory changes within the terminal ileum raise concern for inflammatory bowel disease (Crohn's disease).  . Hypothyroidism 2003    Thyroid US done in 2003 (results in [place]) - showed increased 24 hour uptake and the findings were consistent with Grave's disease; patient is s/p RAI therapy (09/19/2002)  . Back pain, chronic 2001    per MRI in 2001 - disc protrusion L5-6, to the right  . Complication of anesthesia     hard to wake up-sob-possible sleep apnea  . Urinary frequency   . Sickle cell trait     Past Surgical History  Procedure Laterality Date  . Tubal ligation    . Cystocele repair  2004    done by Dr. Su Grand  . Bladder suspension  2004  . Cystoscopy  2008    revision bladder sling  . Revision urinary sling  2006    sling replaced  . Shoulder arthroscopy with subacromial decompression Right 04/03/2015    Procedure: RIGHT SHOULDER ARTHROSCOPY WITH SUBACROMIAL DECOMPRESSION, DEBRIDEMENT ;  Surgeon: Tarry Kos, MD;  Location: [facility];  Service: Orthopedics;   Laterality: Right;    There were no vitals filed for this visit.  Visit Diagnosis:  Pain in joint, shoulder region, right  Shoulder stiffness, right  Weakness of right arm  Decreased ROM of right shoulder      Subjective Assessment - 05/15/15 0948    Pain Descriptors / Indicators --  intermittant muscle spasms    No pain initially.  She has been using her pain medications more regularly.                       [facility] Adult PT Treatment/Exercise - 05/15/15 0949    Elbow Exercises   Other elbow exercises --  45, 39, 30 RT,   Lt64, 63,60 L BS   Moist Heat Therapy   Number Minutes Moist Heat 15 Minutes   Moist Heat Location Shoulder   Manual Therapy   Manual Therapy Edema management;Soft tissue mobilization;Taping   Manual therapy comments Also tapinf to inhibit deltoid, supraspinatus.   Edema Management anterior chest lateral neck, axilla posterior,     Soft tissue mobilization Shoulder girdle, for llymph system vacume activation,, scar tissue mobilization, gentle ROM stretching , Rt shoulder    Passive ROM flexion  134 PROM Rt flexion                  PT Short Term Goals - 05/15/15 1054    PT SHORT  TERM GOAL #1   Title pt will be I with basic HEP 05/19/2015   Time 4   Period Weeks   Status Achieved   PT SHORT TERM GOAL #2   Title pt will decrease pain to <5/10 during PROM/AAROM to assist with exercise progression 05/19/2015   Time 4   Period Weeks   Status On-going   PT SHORT TERM GOAL #3   Baseline 135 degrees flexion PROM   Time 4   Period Weeks   Status On-going   PT SHORT TERM GOAL #4   Title She will be able to verbalize and demonstrate techniques to control R shoulder inflammation via RICE method 05/19/2015   Baseline Uses heat at home , understands use of RICE   Time 4   Period Weeks   Status Achieved   PT SHORT TERM GOAL #5   Title She will increase her FOTO score by > 10 points to help with functional capacity 05/19/2015   Time 4    Period Weeks   Status Unable to assess           PT Long Term Goals - 05/09/15 1305    PT LONG TERM GOAL #1   Title She will be I with advanced HEP (06/17/15)   Time 8   Period Weeks   Status On-going   PT LONG TERM GOAL #2   Title She will demonstrate < 3/10 pain during and following AROM of the R shoulder to assist with ADLs (06/17/15)   Time 8   Period Weeks   Status On-going   PT LONG TERM GOAL #3   Title She will demonstrate functional R shoulder AROM compared bil to assist with personal hygiene and ADLs (06/17/15)   Time 8   Period Weeks   Status On-going   PT LONG TERM GOAL #4   Title She will increase R grip strength to > 60 # to help with funcitonal exercise progression (06/17/15)   Time 8   Period Weeks   Status On-going   PT LONG TERM GOAL #5   Title she will be able to verbalize and demonstrate techniques to reduce risk or R shoulder reinjury via lifting and carry mechanics, and HEP (06/17/15)   Time 8   Period Weeks   Status On-going               Plan - 05/15/15 1050    Clinical Impression Statement No pain initially then post light stretching,  pain returned 3-4/10.  Manual focus today for motion and taping.  Patient wears sling intermittantly at home.   PT Next Visit Plan See what MD said.  assess taping continue work on BJ'sOM   Consulted and Agree with Plan of Care Patient        Problem List Patient Active Problem List   Diagnosis Date Noted  . Vitamin D deficiency 03/13/2015  . Urinary incontinence 02/25/2015  . Rotator cuff arthropathy 06/21/2014  . Health care maintenance 06/21/2014  . Hypertension 06/21/2014  . OSA (obstructive sleep apnea) 07/19/2013  . Obese 07/19/2013  . OSTEOARTHRITIS 12/03/2006  . Hypothyroidism 10/21/2006  . DEPRESSION 10/21/2006    HARRIS,KAREN 05/15/2015, 10:59 AM  [facility] [address] [place], [place], [address] Phone: [phone]   Fax:   [phone]  Liz BeachKaren Harris, PTA 05/15/2015 10:59 AM Phone: [phone] Fax: [phone]

## 2015-05-17 ENCOUNTER — Ambulatory Visit: Payer: No Typology Code available for payment source | Admitting: Physical Therapy

## 2015-05-17 DIAGNOSIS — R29898 Other symptoms and signs involving the musculoskeletal system: Secondary | ICD-10-CM

## 2015-05-17 DIAGNOSIS — M25611 Stiffness of right shoulder, not elsewhere classified: Secondary | ICD-10-CM

## 2015-05-17 DIAGNOSIS — M25511 Pain in right shoulder: Secondary | ICD-10-CM

## 2015-05-17 NOTE — Therapy (Signed)
[facility] [address] [place], [place], [address] Phone: [phone]   Fax:  [phone]  Physical Therapy Treatment  Patient Details  Name: [facility] MRN: [id number] Date of Birth: May 25, 1966 Referring Provider:  Dow Adolph, MD  Encounter Date: 05/17/2015      PT End of Session - 05/17/15 0936    Visit Number 6   Number of Visits 16   Date for PT Re-Evaluation 06/17/15   PT Start Time 0930   PT Stop Time 1038   PT Time Calculation (min) 68 min   Activity Tolerance Patient tolerated treatment well   Behavior During Therapy [facility] for tasks assessed/performed      Past Medical History  Diagnosis Date  . Depression   . Hypertension   . Abdominal pain, recurrent 2009    per CT of the abd/pelvis (01/26/2008) -  Diffuse inflammatory change of the descending and rectosigmoid colon.  Additional inflammatory changes within the terminal ileum raise concern for inflammatory bowel disease (Crohn's disease).  . Hypothyroidism 2003    Thyroid US done in 2003 (results in [facility]) - showed increased 24 hour uptake and the findings were consistent with Grave's disease; patient is s/p RAI therapy (09/19/2002)  . Back pain, chronic 2001    per MRI in 2001 - disc protrusion L5-6, to the right  . Complication of anesthesia     hard to wake up-sob-possible sleep apnea  . Urinary frequency   . Sickle cell trait     Past Surgical History  Procedure Laterality Date  . Tubal ligation    . Cystocele repair  2004    done by Dr. Su [facility]  . Bladder suspension  2004  . Cystoscopy  2008    revision bladder sling  . Revision urinary sling  2006    sling replaced  . Shoulder arthroscopy with subacromial decompression Right 04/03/2015    Procedure: RIGHT SHOULDER ARTHROSCOPY WITH SUBACROMIAL DECOMPRESSION, DEBRIDEMENT ;  Surgeon: [facility] Kos, MD;  Location: [facility];  Service: Orthopedics;  Laterality: Right;     There were no vitals filed for this visit.  Visit Diagnosis:  Pain in joint, shoulder region, right  Shoulder stiffness, right  Weakness of right arm  Decreased ROM of right shoulder      Subjective Assessment - 05/17/15 0934    Subjective "I am doing well and have no pain" she reports seeing [facility] and states that she is doing well   Currently in Pain? Yes   Pain Score 0-No pain            [facility] PT Assessment - 05/17/15 0001    PROM   Right Shoulder Flexion 144 Degrees  assessed in supine                     [facility] Adult PT Treatment/Exercise - 05/17/15 0001    Shoulder Exercises: Standing   External Rotation AROM;Strengthening;Right;10 reps;Theraband   Theraband Level (Shoulder External Rotation) Level 1 (Yellow)   Internal Rotation AROM;Strengthening;Right;10 reps;Theraband   Theraband Level (Shoulder Internal Rotation) Level 1 (Yellow)   Flexion AROM;Strengthening;Right;10 reps  no weight   Extension AROM;Strengthening;Right;10 reps;Theraband   Theraband Level (Shoulder Extension) Level 1 (Yellow)   Row AROM;Strengthening;10 reps;Both;Theraband   Theraband Level (Shoulder Row) Level 1 (Yellow)   Shoulder Exercises: Pulleys   Flexion 2 minutes   ABduction 2 minutes   Modalities   Modalities Electrical Stimulation   Moist Heat Therapy  Number Minutes Moist Heat 10 Minutes   Moist Heat Location Shoulder   Electrical Stimulation   Electrical Stimulation Location R shoulder   Electrical Stimulation Action IFC   Electrical Stimulation Parameters 10 min, 100% scan, to tolerance   Electrical Stimulation Goals Pain   Manual Therapy   Joint Mobilization GH and scapular mobilizations in all planes grade 2-3                 PT Education - 05/17/15 1122    Education provided Yes   Education Details shoulder IR/ER, and rows   Person(s) Educated Patient   Methods Explanation   Comprehension Verbalized understanding          PT Short  Term Goals - 05/15/15 1054    PT SHORT TERM GOAL #1   Title pt will be I with basic HEP 05/19/2015   Time 4   Period Weeks   Status Achieved   PT SHORT TERM GOAL #2   Title pt will decrease pain to <5/10 during PROM/AAROM to assist with exercise progression 05/19/2015   Time 4   Period Weeks   Status On-going   PT SHORT TERM GOAL #3   Baseline 135 degrees flexion PROM   Time 4   Period Weeks   Status On-going   PT SHORT TERM GOAL #4   Title She will be able to verbalize and demonstrate techniques to control R shoulder inflammation via RICE method 05/19/2015   Baseline Uses heat at home , understands use of RICE   Time 4   Period Weeks   Status Achieved   PT SHORT TERM GOAL #5   Title She will increase her FOTO score by > 10 points to help with functional capacity 05/19/2015   Time 4   Period Weeks   Status Unable to assess           PT Long Term Goals - 05/09/15 1305    PT LONG TERM GOAL #1   Title She will be I with advanced HEP (06/17/15)   Time 8   Period Weeks   Status On-going   PT LONG TERM GOAL #2   Title She will demonstrate < 3/10 pain during and following AROM of the R shoulder to assist with ADLs (06/17/15)   Time 8   Period Weeks   Status On-going   PT LONG TERM GOAL #3   Title She will demonstrate functional R shoulder AROM compared bil to assist with personal hygiene and ADLs (06/17/15)   Time 8   Period Weeks   Status On-going   PT LONG TERM GOAL #4   Title She will increase R grip strength to > 60 # to help with funcitonal exercise progression (06/17/15)   Time 8   Period Weeks   Status On-going   PT LONG TERM GOAL #5   Title she will be able to verbalize and demonstrate techniques to reduce risk or R shoulder reinjury via lifting and carry mechanics, and HEP (06/17/15)   Time 8   Period Weeks   Status On-going               Plan - 05/17/15 1122    Clinical Impression Statement [facility] presents to therapy today with no report of increased pain or  soreness in the R shoulder. She states that the kinesio taping is helping. she has increased her R shoulder AROM  to 146 degrees of flexion and was able to do it actively after mobilizations while  in supine. added shoulder IR/ER with light yellow theraband to HEP to improve her strengthening. Plan to progress with exercise per protocol as tolerated.    PT Next Visit Plan shoulder mobility, scapular stability and shoulder strengthening, KT tape, modalities PRN   PT Home Exercise Plan shoulder IR/ER and rows with light yellow band   Consulted and Agree with Plan of Care Patient        Problem List Patient Active Problem List   Diagnosis Date Noted  . Vitamin D deficiency 03/13/2015  . Urinary incontinence 02/25/2015  . Rotator cuff arthropathy 06/21/2014  . Health care maintenance 06/21/2014  . Hypertension 06/21/2014  . OSA (obstructive sleep apnea) 07/19/2013  . Obese 07/19/2013  . OSTEOARTHRITIS 12/03/2006  . Hypothyroidism 10/21/2006  . DEPRESSION 10/21/2006   Lulu RidingKristoffer Oakley Kossman PT, DPT, LAT, ATC  05/17/2015  11:27 AM    [facility] [address] [place], [place], [address] Phone: [phone]   Fax:  [phone]

## 2015-05-17 NOTE — Patient Instructions (Signed)

## 2015-05-20 ENCOUNTER — Ambulatory Visit: Payer: No Typology Code available for payment source | Admitting: Physical Therapy

## 2015-05-20 DIAGNOSIS — R29898 Other symptoms and signs involving the musculoskeletal system: Secondary | ICD-10-CM

## 2015-05-20 DIAGNOSIS — M25611 Stiffness of right shoulder, not elsewhere classified: Secondary | ICD-10-CM

## 2015-05-20 DIAGNOSIS — M25511 Pain in right shoulder: Secondary | ICD-10-CM

## 2015-05-20 NOTE — Patient Instructions (Signed)
Cane Overhead - Standing   With arms straight, hold cane forward at waist. Raise cane above head. Hold _0__ seconds. Repeat 10-20___ times. Do Cane Exercise: Abduction   Hold cane with right hand over end, palm-up, with other hand palm-down. Move arm out from side and up by pushing with other arm. Hold _0___ seconds. Repeat __10-20__ times. Do 2-3____ sessions per day.  http://[email or web address].exer.us/82  Also ER standing 10- 20 reps 2-3 times a day Copyright  VHI. All rights reserved.  2-3 ___ times per day.  Copyright  VHI. All rights reserved.

## 2015-05-20 NOTE — Therapy (Signed)
[facility] [address] [place], [place], [address] Phone: [phone]   Fax:  [phone]  Physical Therapy Treatment  Patient Details  Name: [facility] MRN: [id number] Date of Birth: 1966/09/19 Referring Provider:  Tarry Kos, MD  Encounter Date: 05/20/2015      PT End of Session - 05/20/15 1131    Visit Number 7   Number of Visits 16   Date for PT Re-Evaluation 06/17/15   PT Start Time 1015   PT Stop Time 1115   PT Time Calculation (min) 60 min   Activity Tolerance Patient tolerated treatment well   Behavior During Therapy [facility] for tasks assessed/performed      Past Medical History  Diagnosis Date  . Depression   . Hypertension   . Abdominal pain, recurrent 2009    per CT of the abd/pelvis (01/26/2008) -  Diffuse inflammatory change of the descending and rectosigmoid colon.  Additional inflammatory changes within the terminal ileum raise concern for inflammatory bowel disease (Crohn's disease).  . Hypothyroidism 2003    Thyroid US done in 2003 (results in [place]) - showed increased 24 hour uptake and the findings were consistent with Grave's disease; patient is s/p RAI therapy (09/19/2002)  . Back pain, chronic 2001    per MRI in 2001 - disc protrusion L5-6, to the right  . Complication of anesthesia     hard to wake up-sob-possible sleep apnea  . Urinary frequency   . Sickle cell trait     Past Surgical History  Procedure Laterality Date  . Tubal ligation    . Cystocele repair  2004    done by Dr. Su Grand  . Bladder suspension  2004  . Cystoscopy  2008    revision bladder sling  . Revision urinary sling  2006    sling replaced  . Shoulder arthroscopy with subacromial decompression Right 04/03/2015    Procedure: RIGHT SHOULDER ARTHROSCOPY WITH SUBACROMIAL DECOMPRESSION, DEBRIDEMENT ;  Surgeon: Tarry Kos, MD;  Location: [facility];  Service: Orthopedics;  Laterality: Right;    There  were no vitals filed for this visit.  Visit Diagnosis:  Pain in joint, shoulder region, right  Shoulder stiffness, right  Weakness of right arm  Decreased ROM of right shoulder      Subjective Assessment - 05/20/15 1032    Subjective                            [facility] Adult PT Treatment/Exercise - 05/20/15 1030    Shoulder Exercises: Supine   Flexion 5 reps;20 reps  2 sets    Other Supine Exercises serratus,    Shoulder Exercises: Seated   Flexion AAROM  10 reps, guarding   Abduction AAROM  guarding.     Shoulder Exercises: Pulleys   Flexion --  5 minutes, flexion   Shoulder Exercises: ROM/Strengthening   Other ROM/Strengthening Exercises serratus, 5 reps 2 sets,   supine, triceps 5 reps 2 sets , AROM, elbow supported.    Moist Heat Therapy   Number Minutes Moist Heat 15 Minutes   Moist Heat Location Shoulder   Electrical Stimulation   Electrical Stimulation Location Rt shoyulder   Electrical Stimulation Action IFC   Electrical Stimulation Parameters 15 minutes   Electrical Stimulation Goals Pain   Manual Therapy   Soft tissue mobilization --  soft tissue work scar mobilization, Joint mobilization,infer  PT Education - 05/20/15 1124    Education Details cane standing   Person(s) Educated Patient   Methods Explanation;Demonstration;Handout   Comprehension Verbalized understanding          PT Short Term Goals - 05/15/15 1054    PT SHORT TERM GOAL #1   Title pt will be I with basic HEP 05/19/2015   Time 4   Period Weeks   Status Achieved   PT SHORT TERM GOAL #2   Title pt will decrease pain to <5/10 during PROM/AAROM to assist with exercise progression 05/19/2015   Time 4   Period Weeks   Status On-going   PT SHORT TERM GOAL #3   Baseline 135 degrees flexion PROM   Time 4   Period Weeks   Status On-going   PT SHORT TERM GOAL #4   Title She will be able to verbalize and demonstrate techniques to control R shoulder  inflammation via RICE method 05/19/2015   Baseline Uses heat at home , understands use of RICE   Time 4   Period Weeks   Status Achieved   PT SHORT TERM GOAL #5   Title She will increase her FOTO score by > 10 points to help with functional capacity 05/19/2015   Time 4   Period Weeks   Status Unable to assess           PT Long Term Goals - 05/09/15 1305    PT LONG TERM GOAL #1   Title She will be I with advanced HEP (06/17/15)   Time 8   Period Weeks   Status On-going   PT LONG TERM GOAL #2   Title She will demonstrate < 3/10 pain during and following AROM of the R shoulder to assist with ADLs (06/17/15)   Time 8   Period Weeks   Status On-going   PT LONG TERM GOAL #3   Title She will demonstrate functional R shoulder AROM compared bil to assist with personal hygiene and ADLs (06/17/15)   Time 8   Period Weeks   Status On-going   PT LONG TERM GOAL #4   Title She will increase R grip strength to > 60 # to help with funcitonal exercise progression (06/17/15)   Time 8   Period Weeks   Status On-going   PT LONG TERM GOAL #5   Title she will be able to verbalize and demonstrate techniques to reduce risk or R shoulder reinjury via lifting and carry mechanics, and HEP (06/17/15)   Time 8   Period Weeks   Status On-going               Plan - 05/20/15 1132    Clinical Impression Statement Patient continues to make progress toward goals,  She says she feels "Like I am going to Fall out when I get pain"  No pain post session,  Progress toward home exercise program goal.        Problem List Patient Active Problem List   Diagnosis Date Noted  . Vitamin D deficiency 03/13/2015  . Urinary incontinence 02/25/2015  . Rotator cuff arthropathy 06/21/2014  . Health care maintenance 06/21/2014  . Hypertension 06/21/2014  . OSA (obstructive sleep apnea) 07/19/2013  . Obese 07/19/2013  . OSTEOARTHRITIS 12/03/2006  . Hypothyroidism 10/21/2006  . DEPRESSION 10/21/2006     Robyn Nohr 05/20/2015, 11:35 AM  [facility] [address] [place], [place], [address] Phone: [phone]   Fax:  [phone]  Liz Beach,  PTA 05/20/2015 11:35 AM Phone: [phone] Fax: [phone]

## 2015-05-22 ENCOUNTER — Ambulatory Visit: Payer: No Typology Code available for payment source | Admitting: Physical Therapy

## 2015-05-22 DIAGNOSIS — M25511 Pain in right shoulder: Secondary | ICD-10-CM

## 2015-05-22 DIAGNOSIS — M25611 Stiffness of right shoulder, not elsewhere classified: Secondary | ICD-10-CM

## 2015-05-22 DIAGNOSIS — R29898 Other symptoms and signs involving the musculoskeletal system: Secondary | ICD-10-CM

## 2015-05-22 NOTE — Therapy (Signed)
[facility] [address] [place], [place], [address] Phone: [phone]   Fax:  [phone]  Physical Therapy Treatment  Patient Details  Name: [facility] MRN: [id number] Date of Birth: Mar 27, 1966 Referring Provider:  Tarry Kos, MD  Encounter Date: 05/22/2015      PT End of Session - 05/22/15 1016    Visit Number 8   Number of Visits 16   Date for PT Re-Evaluation 06/17/15   PT Start Time 1015   PT Stop Time 1110   PT Time Calculation (min) 55 min      Past Medical History  Diagnosis Date  . Depression   . Hypertension   . Abdominal pain, recurrent 2009    per CT of the abd/pelvis (01/26/2008) -  Diffuse inflammatory change of the descending and rectosigmoid colon.  Additional inflammatory changes within the terminal ileum raise concern for inflammatory bowel disease (Crohn's disease).  . Hypothyroidism 2003    Thyroid US done in 2003 (results in [place]) - showed increased 24 hour uptake and the findings were consistent with Grave's disease; patient is s/p RAI therapy (09/19/2002)  . Back pain, chronic 2001    per MRI in 2001 - disc protrusion L5-6, to the right  . Complication of anesthesia     hard to wake up-sob-possible sleep apnea  . Urinary frequency   . Sickle cell trait     Past Surgical History  Procedure Laterality Date  . Tubal ligation    . Cystocele repair  2004    done by Dr. Su Grand  . Bladder suspension  2004  . Cystoscopy  2008    revision bladder sling  . Revision urinary sling  2006    sling replaced  . Shoulder arthroscopy with subacromial decompression Right 04/03/2015    Procedure: RIGHT SHOULDER ARTHROSCOPY WITH SUBACROMIAL DECOMPRESSION, DEBRIDEMENT ;  Surgeon: Tarry Kos, MD;  Location: [facility];  Service: Orthopedics;  Laterality: Right;    There were no vitals filed for this visit.  Visit Diagnosis:  Pain in joint, shoulder region, right  Shoulder  stiffness, right  Weakness of right arm  Decreased ROM of right shoulder      Subjective Assessment - 05/22/15 1016    Subjective "I am doing well and have no pain today"   Currently in Pain? Yes   Pain Score 0-No pain            [facility] PT Assessment - 05/22/15 0001    Observation/Other Assessments   Focus on Therapeutic Outcomes (FOTO)  56% limited                     [facility] Adult PT Treatment/Exercise - 05/22/15 0001    Shoulder Exercises: Standing   External Rotation AROM;Strengthening;Right;10 reps;Theraband   Theraband Level (Shoulder External Rotation) Level 1 (Yellow)   Internal Rotation AROM;Strengthening;Right;10 reps;Theraband   Theraband Level (Shoulder Internal Rotation) Level 1 (Yellow)   Flexion AROM;Strengthening;Right;10 reps   Extension AROM;Strengthening;Right;10 reps;Theraband   Theraband Level (Shoulder Extension) Level 3 (Green)   Row AROM;Strengthening;10 reps;Both;Theraband   Theraband Level (Shoulder Row) Level 3 (Green)   Other Standing Exercises bicep curls 2 x 10   3#   Shoulder Exercises: Pulleys   Flexion 2 minutes   ABduction 2 minutes   Shoulder Exercises: ROM/Strengthening   UBE (Upper Arm Bike) L1 x 6 min   Other ROM/Strengthening Exercises serratus, 5 reps 2 sets,    Electrical Stimulation  Electrical Stimulation Location Rt shoulder   Electrical Stimulation Action IFC   Electrical Stimulation Parameters 10 min, 100% scan to tolerance   Electrical Stimulation Goals Pain   Manual Therapy   Manual therapy comments Also tapinf to inhibit deltoid, supraspinatus.   Joint Mobilization GH and scapular mobilizations in all planes grade 2-3                 PT Education - 05/22/15 1246    Education provided No          PT Short Term Goals - 05/15/15 1054    PT SHORT TERM GOAL #1   Title pt will be I with basic HEP 05/19/2015   Time 4   Period Weeks   Status Achieved   PT SHORT TERM GOAL #2   Title pt will  decrease pain to <5/10 during PROM/AAROM to assist with exercise progression 05/19/2015   Time 4   Period Weeks   Status On-going   PT SHORT TERM GOAL #3   Baseline 135 degrees flexion PROM   Time 4   Period Weeks   Status On-going   PT SHORT TERM GOAL #4   Title She will be able to verbalize and demonstrate techniques to control R shoulder inflammation via RICE method 05/19/2015   Baseline Uses heat at home , understands use of RICE   Time 4   Period Weeks   Status Achieved   PT SHORT TERM GOAL #5   Title She will increase her FOTO score by > 10 points to help with functional capacity 05/19/2015   Time 4   Period Weeks   Status Unable to assess           PT Long Term Goals - 05/09/15 1305    PT LONG TERM GOAL #1   Title She will be I with advanced HEP (06/17/15)   Time 8   Period Weeks   Status On-going   PT LONG TERM GOAL #2   Title She will demonstrate < 3/10 pain during and following AROM of the R shoulder to assist with ADLs (06/17/15)   Time 8   Period Weeks   Status On-going   PT LONG TERM GOAL #3   Title She will demonstrate functional R shoulder AROM compared bil to assist with personal hygiene and ADLs (06/17/15)   Time 8   Period Weeks   Status On-going   PT LONG TERM GOAL #4   Title She will increase R grip strength to > 60 # to help with funcitonal exercise progression (06/17/15)   Time 8   Period Weeks   Status On-going   PT LONG TERM GOAL #5   Title she will be able to verbalize and demonstrate techniques to reduce risk or R shoulder reinjury via lifting and carry mechanics, and HEP (06/17/15)   Time 8   Period Weeks   Status On-going               Plan - 05/22/15 1247    Clinical Impression Statement Jonna ClarkLillie continues to make progress with increase shoulder mobility with flexion and abduction with mild pain and tightness at end range. she continues to report no increased pain during strengthening activities. plan to progress as tolerated.    PT Next  Visit Plan shoulder mobility, scapular stability and shoulder strengthening, KT tape, modalities PRN   Consulted and Agree with Plan of Care Patient        Problem List Patient Active Problem List  Diagnosis Date Noted  . Vitamin D deficiency 03/13/2015  . Urinary incontinence 02/25/2015  . Rotator cuff arthropathy 06/21/2014  . Health care maintenance 06/21/2014  . Hypertension 06/21/2014  . OSA (obstructive sleep apnea) 07/19/2013  . Obese 07/19/2013  . OSTEOARTHRITIS 12/03/2006  . Hypothyroidism 10/21/2006  . DEPRESSION 10/21/2006   Lulu Riding PT, DPT, LAT, ATC  05/22/2015  12:53 PM      [facility] [address] [place], [place], [address] Phone: [phone]   Fax:  [phone]

## 2015-05-27 ENCOUNTER — Ambulatory Visit: Payer: No Typology Code available for payment source | Admitting: Physical Therapy

## 2015-05-27 DIAGNOSIS — M25611 Stiffness of right shoulder, not elsewhere classified: Secondary | ICD-10-CM

## 2015-05-27 DIAGNOSIS — R29898 Other symptoms and signs involving the musculoskeletal system: Secondary | ICD-10-CM

## 2015-05-27 DIAGNOSIS — M25511 Pain in right shoulder: Secondary | ICD-10-CM

## 2015-05-27 NOTE — Therapy (Signed)
[facility] Outpatient Rehabilitation [facility] [address] [place], [place], [address] Phone: [phone]   Fax:  [phone]  Physical Therapy Treatment  Patient Details  Name: Shannon Newton MRN: [id number] Date of Birth: Jan 27, 1966 Referring Provider:  Dow Adolph, MD  Encounter Date: 05/27/2015      PT End of Session - 05/27/15 1102    Visit Number 9   Number of Visits 16   Date for PT Re-Evaluation 06/17/15   PT Start Time 1015   PT Stop Time 1115   PT Time Calculation (min) 60 min      Past Medical History  Diagnosis Date  . Depression   . Hypertension   . Abdominal pain, recurrent 2009    per CT of the abd/pelvis (01/26/2008) -  Diffuse inflammatory change of the descending and rectosigmoid colon.  Additional inflammatory changes within the terminal ileum raise concern for inflammatory bowel disease (Crohn's disease).  . Hypothyroidism 2003    Thyroid US done in 2003 (results in [place]) - showed increased 24 hour uptake and the findings were consistent with Grave's disease; patient is s/p RAI therapy (09/19/2002)  . Back pain, chronic 2001    per MRI in 2001 - disc protrusion L5-6, to the right  . Complication of anesthesia     hard to wake up-sob-possible sleep apnea  . Urinary frequency   . Sickle cell trait     Past Surgical History  Procedure Laterality Date  . Tubal ligation    . Cystocele repair  2004    done by Dr. Su Grand  . Bladder suspension  2004  . Cystoscopy  2008    revision bladder sling  . Revision urinary sling  2006    sling replaced  . Shoulder arthroscopy with subacromial decompression Right 04/03/2015    Procedure: RIGHT SHOULDER ARTHROSCOPY WITH SUBACROMIAL DECOMPRESSION, DEBRIDEMENT ;  Surgeon: Tarry Kos, MD;  Location: [facility];  Service: Orthopedics;  Laterality: Right;    There were no vitals filed for this visit.  Visit Diagnosis:  Pain in joint, shoulder region, right  Shoulder  stiffness, right  Weakness of right arm  Decreased ROM of right shoulder      Subjective Assessment - 05/27/15 1051    Subjective I did some cooking with help and its hurting   Currently in Pain? Yes   Pain Score 4    Pain Location Shoulder   Pain Orientation Right   Pain Descriptors / Indicators Throbbing   Pain Type Surgical pain   Aggravating Factors  riding in the care, trying to sleep   Pain Relieving Factors medication, heat, rest                          [facility] Adult PT Treatment/Exercise - 05/27/15 1034    Shoulder Exercises: Supine   External Rotation PROM;AROM;Right;15 reps   Flexion 5 reps;20 reps  2 sets    Flexion Limitations punches with serratue lift    Other Supine Exercises supine clasp hand pullovers x 10   Shoulder Exercises: Standing   External Rotation AROM;Strengthening;Right;10 reps;Theraband   Theraband Level (Shoulder External Rotation) Level 2 (Red)   Internal Rotation AROM;Strengthening;Right;10 reps;Theraband   Theraband Level (Shoulder Internal Rotation) Level 2 (Red)   Extension AROM;Strengthening;Right;10 reps;Theraband   Theraband Level (Shoulder Extension) Level 3 (Green)   Row AROM;Strengthening;10 reps;Both;Theraband   Theraband Level (Shoulder Row) Level 3 (Green)   Other Standing Exercises staning UE  ranger 2in, 4 in and 6 in step x 10 each then at wall level8 with step forward x 20    Shoulder Exercises: Pulleys   Flexion 2 minutes   ABduction 1 minute   Shoulder Exercises: ROM/Strengthening   UBE (Upper Arm Bike) L1 x 6 min   Modalities   Modalities --   Moist Heat Therapy   Number Minutes Moist Heat 15 Minutes   Moist Heat Location Shoulder                  PT Short Term Goals - 05/15/15 1054    PT SHORT TERM GOAL #1   Title pt will be I with basic HEP 05/19/2015   Time 4   Period Weeks   Status Achieved   PT SHORT TERM GOAL #2   Title pt will decrease pain to <5/10 during PROM/AAROM to assist  with exercise progression 05/19/2015   Time 4   Period Weeks   Status On-going   PT SHORT TERM GOAL #3   Baseline 135 degrees flexion PROM   Time 4   Period Weeks   Status On-going   PT SHORT TERM GOAL #4   Title She will be able to verbalize and demonstrate techniques to control R shoulder inflammation via RICE method 05/19/2015   Baseline Uses heat at home , understands use of RICE   Time 4   Period Weeks   Status Achieved   PT SHORT TERM GOAL #5   Title She will increase her FOTO score by > 10 points to help with functional capacity 05/19/2015   Time 4   Period Weeks   Status Unable to assess           PT Long Term Goals - 05/09/15 1305    PT LONG TERM GOAL #1   Title She will be I with advanced HEP (06/17/15)   Time 8   Period Weeks   Status On-going   PT LONG TERM GOAL #2   Title She will demonstrate < 3/10 pain during and following AROM of the R shoulder to assist with ADLs (06/17/15)   Time 8   Period Weeks   Status On-going   PT LONG TERM GOAL #3   Title She will demonstrate functional R shoulder AROM compared bil to assist with personal hygiene and ADLs (06/17/15)   Time 8   Period Weeks   Status On-going   PT LONG TERM GOAL #4   Title She will increase R grip strength to > 60 # to help with funcitonal exercise progression (06/17/15)   Time 8   Period Weeks   Status On-going   PT LONG TERM GOAL #5   Title she will be able to verbalize and demonstrate techniques to reduce risk or R shoulder reinjury via lifting and carry mechanics, and HEP (06/17/15)   Time 8   Period Weeks   Status On-going               Plan - 05/27/15 1055    Clinical Impression Statement Pt instructed in AAROm and AROM strengthening as well as RTC strengthening. Pt reports feeling fatigued in the shoulder with HEP and denies pain prior to HMP. She reports less pain with exercises today compared to previous treatments.  Pt reports increased pain upon sitting for HMP and is offered pillow  support and feels better.    PT Next Visit Plan shoulder mobility, scapular stability and shoulder strengthening, KT tape, modalities PRN, FOTO  Problem List Patient Active Problem List   Diagnosis Date Noted  . Vitamin D deficiency 03/13/2015  . Urinary incontinence 02/25/2015  . Rotator cuff arthropathy 06/21/2014  . Health care maintenance 06/21/2014  . Hypertension 06/21/2014  . OSA (obstructive sleep apnea) 07/19/2013  . Obese 07/19/2013  . OSTEOARTHRITIS 12/03/2006  . Hypothyroidism 10/21/2006  . DEPRESSION 10/21/2006    Sherrie Mustache , PTA  05/27/2015, 11:03 AM  [facility] [address] [place], [place], [address] Phone: [phone]   Fax:  [phone]

## 2015-05-29 ENCOUNTER — Ambulatory Visit: Payer: No Typology Code available for payment source | Admitting: Physical Therapy

## 2015-06-03 ENCOUNTER — Ambulatory Visit: Payer: No Typology Code available for payment source | Admitting: Physical Therapy

## 2015-06-03 DIAGNOSIS — M25611 Stiffness of right shoulder, not elsewhere classified: Secondary | ICD-10-CM

## 2015-06-03 DIAGNOSIS — M25511 Pain in right shoulder: Secondary | ICD-10-CM

## 2015-06-03 DIAGNOSIS — R29898 Other symptoms and signs involving the musculoskeletal system: Secondary | ICD-10-CM

## 2015-06-03 NOTE — Therapy (Signed)
[facility] Outpatient Rehabilitation [facility] [address] [place], [place], [address] Phone: [phone]   Fax:  [phone]  Physical Therapy Treatment  Patient Details  Name: Shannon Newton MRN: [id number] Date of Birth: [birth year] Referring Provider:  Dow Adolph, MD  Encounter Date: 06/03/2015      PT End of Session - 06/03/15 1110    Visit Number 10   Number of Visits 16   Date for PT Re-Evaluation 06/17/15   PT Start Time 1015   PT Stop Time 1108   PT Time Calculation (min) 53 min   Activity Tolerance Patient tolerated treatment well   Behavior During Therapy [facility] for tasks assessed/performed      Past Medical History  Diagnosis Date  . Depression   . Hypertension   . Abdominal pain, recurrent 2009    per CT of the abd/pelvis (01/26/2008) -  Diffuse inflammatory change of the descending and rectosigmoid colon.  Additional inflammatory changes within the terminal ileum raise concern for inflammatory bowel disease (Crohn's disease).  . Hypothyroidism 2003    Thyroid US done in 2003 (results in [place]) - showed increased 24 hour uptake and the findings were consistent with Grave's disease; patient is s/p RAI therapy (09/19/2002)  . Back pain, chronic 2001    per MRI in 2001 - disc protrusion L5-6, to the right  . Complication of anesthesia     hard to wake up-sob-possible sleep apnea  . Urinary frequency   . Sickle cell trait     Past Surgical History  Procedure Laterality Date  . Tubal ligation    . Cystocele repair  2004    done by Dr. Su Grand  . Bladder suspension  2004  . Cystoscopy  2008    revision bladder sling  . Revision urinary sling  2006    sling replaced  . Shoulder arthroscopy with subacromial decompression Right 04/03/2015    Procedure: RIGHT SHOULDER ARTHROSCOPY WITH SUBACROMIAL DECOMPRESSION, DEBRIDEMENT ;  Surgeon: Tarry Kos, MD;  Location:  [facility];  Service: Orthopedics;  Laterality: Right;     There were no vitals filed for this visit.  Visit Diagnosis:  Pain in joint, shoulder region, right  Shoulder stiffness, right  Weakness of right arm  Decreased ROM of right shoulder      Subjective Assessment - 06/03/15 1015    Subjective "I am having some burning in the shoulder, I don't know what is making that happen" she reports only sleeping on it   Currently in Pain? Yes   Pain Score 0-No pain  last took pain pill at 2-3am   Pain Location Shoulder   Pain Orientation Right   Aggravating Factors  sleeping on it   Pain Relieving Factors pain meds, heat and rest                         [facility] Adult PT Treatment/Exercise - 06/03/15 0001    Shoulder Exercises: Supine   Protraction AROM;Strengthening   External Rotation --  ceiling punch (serratus punch)   Shoulder Exercises: Seated   Other Seated Exercises seated serratus punch with towel on table   x 20 with table elevated and seated next to table.    Shoulder Exercises: Standing   External Rotation AROM;Strengthening;Right;10 reps;Theraband   Theraband Level (Shoulder External Rotation) Level 2 (Red)   Internal Rotation AROM;Strengthening;Right;10 reps;Theraband   Theraband Level (Shoulder Internal Rotation) Level 2 (Red)   Flexion AROM;Strengthening;Right;10 reps  Theraband Level (Shoulder Flexion) --  no weight   Extension AROM;Strengthening;Right;10 reps;Theraband   Theraband Level (Shoulder Extension) Level 3 (Green)   Row AROM;Strengthening;10 reps;Both;Theraband   Theraband Level (Shoulder Row) Level 3 (Green)   Shoulder Exercises: ROM/Strengthening   UBE (Upper Arm Bike) L1.5 x 6 min  alt dir every 2 min   Shoulder Exercises: Stretch   Corner Stretch 2 reps;30 seconds   Moist Heat Therapy   Number Minutes Moist Heat 10 Minutes   Moist Heat Location Shoulder   Electrical Stimulation   Electrical Stimulation Location Rt shoulder   Electrical Stimulation Action IFC   Electrical  Stimulation Parameters 10 min, 100% scan, to tolerance   Electrical Stimulation Goals Pain   Manual Therapy   Joint Mobilization GH and scapular mobilizations in all planes grade 2-3                 PT Education - 06/03/15 1109    Education provided Yes   Education Details self shoulder inferior moblization with towel   Person(s) Educated Patient   Methods Explanation   Comprehension Verbalized understanding          PT Short Term Goals - 06/03/15 1229    PT SHORT TERM GOAL #1   Title pt will be I with basic HEP 05/19/2015   Time 4   Period Weeks   Status Achieved   PT SHORT TERM GOAL #2   Title pt will decrease pain to <5/10 during PROM/AAROM to assist with exercise progression 05/19/2015   Baseline 9/10 during movement   Time 4   Period Weeks   Status On-going   PT SHORT TERM GOAL #3   Title pt will Increase R shoulder PROM flexion and abduction by >20 degrees to asisst with ADLs 05/19/2015   Baseline 135 degrees flexion PROM   Time 4   Period Weeks   Status On-going   PT SHORT TERM GOAL #4   Title She will be able to verbalize and demonstrate techniques to control R shoulder inflammation via RICE method 05/19/2015   Baseline Uses heat at home , understands use of RICE   Time 4   Period Weeks   Status Achieved   PT SHORT TERM GOAL #5   Title She will increase her FOTO score by > 10 points to help with functional capacity 05/19/2015   Time 4   Period Weeks   Status On-going           PT Long Term Goals - 06/03/15 1230    PT LONG TERM GOAL #1   Title She will be I with advanced HEP (06/17/15)   Time 8   Period Weeks   Status On-going   PT LONG TERM GOAL #2   Title She will demonstrate < 3/10 pain during and following AROM of the R shoulder to assist with ADLs (06/17/15)   Time 8   Period Weeks   Status On-going   PT LONG TERM GOAL #3   Title She will demonstrate functional R shoulder AROM compared bil to assist with personal hygiene and ADLs (06/17/15)   Time  8   Period Weeks   Status On-going   PT LONG TERM GOAL #4   Title She will increase R grip strength to > 60 # to help with funcitonal exercise progression (06/17/15)   Time 8   Period Weeks   Status On-going   PT LONG TERM GOAL #5   Title she will be able to verbalize  and demonstrate techniques to reduce risk or R shoulder reinjury via lifting and carry mechanics, and HEP (06/17/15)   Time 8   Period Weeks   Status On-going               Plan - 06/03/15 1226    Clinical Impression Statement Dalia presents to therapy today with report of feeling some intermittent burning in the shoulder. She reports trying to do more at home and is sore from doing the dishes and driving. educated that if pain increased to use ice to help control  it. gave self mobilization with towel to HEP to assist with pain reduction and increased mobility, which she reported feeling much better following treatment today..   PT Next Visit Plan shoulder mobility, scapular stability and shoulder strengthening, KT tape, modalities PRN, FOTO, FOTO,FOTO   PT Home Exercise Plan shoulder IR/ER and rows with light yellow band        Problem List Patient Active Problem List   Diagnosis Date Noted  . Vitamin D deficiency 03/13/2015  . Urinary incontinence 02/25/2015  . Rotator cuff arthropathy 06/21/2014  . Health care maintenance 06/21/2014  . Hypertension 06/21/2014  . OSA (obstructive sleep apnea) 07/19/2013  . Obese 07/19/2013  . OSTEOARTHRITIS 12/03/2006  . Hypothyroidism 10/21/2006  . DEPRESSION 10/21/2006   Lulu Riding PT, DPT, LAT, ATC  06/03/2015  12:34 PM    [facility] Outpatient Rehabilitation [facility] [address] [place], [place], [address] Phone: [phone]   Fax:  [phone]

## 2015-06-03 NOTE — Patient Instructions (Signed)
   Using towel, pull down with left arm in an oscillating pattern start gradually and progress to more  Do: 2 sets of 1 minutes  Performed 2 times a day.

## 2015-06-05 ENCOUNTER — Ambulatory Visit: Payer: No Typology Code available for payment source | Admitting: Physical Therapy

## 2015-06-05 ENCOUNTER — Encounter: Payer: Self-pay | Admitting: Internal Medicine

## 2015-06-05 ENCOUNTER — Ambulatory Visit (INDEPENDENT_AMBULATORY_CARE_PROVIDER_SITE_OTHER): Payer: No Typology Code available for payment source | Admitting: Internal Medicine

## 2015-06-05 VITALS — BP 134/80 | HR 87 | Temp 98.0°F | Ht 61.0 in | Wt 219.0 lb

## 2015-06-05 DIAGNOSIS — R29898 Other symptoms and signs involving the musculoskeletal system: Secondary | ICD-10-CM

## 2015-06-05 DIAGNOSIS — Z6841 Body Mass Index (BMI) 40.0 and over, adult: Secondary | ICD-10-CM

## 2015-06-05 DIAGNOSIS — M12811 Other specific arthropathies, not elsewhere classified, right shoulder: Secondary | ICD-10-CM

## 2015-06-05 DIAGNOSIS — Z0000 Encounter for general adult medical examination without abnormal findings: Secondary | ICD-10-CM

## 2015-06-05 DIAGNOSIS — M12511 Traumatic arthropathy, right shoulder: Secondary | ICD-10-CM

## 2015-06-05 DIAGNOSIS — M25511 Pain in right shoulder: Secondary | ICD-10-CM

## 2015-06-05 DIAGNOSIS — Z79899 Other long term (current) drug therapy: Secondary | ICD-10-CM

## 2015-06-05 DIAGNOSIS — I10 Essential (primary) hypertension: Secondary | ICD-10-CM

## 2015-06-05 DIAGNOSIS — E559 Vitamin D deficiency, unspecified: Secondary | ICD-10-CM

## 2015-06-05 DIAGNOSIS — Z23 Encounter for immunization: Secondary | ICD-10-CM

## 2015-06-05 DIAGNOSIS — E669 Obesity, unspecified: Secondary | ICD-10-CM

## 2015-06-05 DIAGNOSIS — M25611 Stiffness of right shoulder, not elsewhere classified: Secondary | ICD-10-CM

## 2015-06-05 DIAGNOSIS — E039 Hypothyroidism, unspecified: Secondary | ICD-10-CM

## 2015-06-05 MED ORDER — VITAMIN D3 10 MCG (400 UNIT) PO TABS
800.0000 [IU] | ORAL_TABLET | Freq: Every day | ORAL | Status: DC
Start: 2015-06-05 — End: 2019-08-16

## 2015-06-05 MED ORDER — CAMPHOR-MENTHOL-METHYL SAL 3.1-16-10 % EX GEL: CUTANEOUS | Status: DC

## 2015-06-05 NOTE — Assessment & Plan Note (Signed)
Counseled about wt loss and lifestyle changes.

## 2015-06-05 NOTE — Therapy (Signed)
[facility] Outpatient Rehabilitation [facility] [address] [place], [place], [address] Phone: [phone]   Fax:  [phone]  Physical Therapy Treatment  Patient Details  Name: Shannon Newton MRN: [id number] Date of Birth: 05/12/1966 Referring Provider:  Dow Adolph, MD  Encounter Date: 06/05/2015      PT End of Session - 06/05/15 1053    Visit Number 11   Number of Visits 16   Date for PT Re-Evaluation 06/17/15   PT Start Time 0950   PT Stop Time 1100   PT Time Calculation (min) 70 min   Activity Tolerance Patient tolerated treatment well   Behavior During Therapy [facility] for tasks assessed/performed      Past Medical History  Diagnosis Date  . Depression   . Hypertension   . Abdominal pain, recurrent 2009    per CT of the abd/pelvis (01/26/2008) -  Diffuse inflammatory change of the descending and rectosigmoid colon.  Additional inflammatory changes within the terminal ileum raise concern for inflammatory bowel disease (Crohn's disease).  . Hypothyroidism 2003    Thyroid US done in 2003 (results in [place]) - showed increased 24 hour uptake and the findings were consistent with Grave's disease; patient is s/p RAI therapy (09/19/2002)  . Back pain, chronic 2001    per MRI in 2001 - disc protrusion L5-6, to the right  . Complication of anesthesia     hard to wake up-sob-possible sleep apnea  . Urinary frequency   . Sickle cell trait     Past Surgical History  Procedure Laterality Date  . Tubal ligation    . Cystocele repair  2004    done by Dr. Su Grand  . Bladder suspension  2004  . Cystoscopy  2008    revision bladder sling  . Revision urinary sling  2006    sling replaced  . Shoulder arthroscopy with subacromial decompression Right 04/03/2015    Procedure: RIGHT SHOULDER ARTHROSCOPY WITH SUBACROMIAL DECOMPRESSION, DEBRIDEMENT ;  Surgeon: Tarry Kos, MD;  Location: [facility];  Service: Orthopedics;  Laterality: Right;     There were no vitals filed for this visit.  Visit Diagnosis:  Pain in joint, shoulder region, right  Shoulder stiffness, right  Weakness of right arm  Decreased ROM of right shoulder      Subjective Assessment - 06/05/15 1022    Subjective "I am stying consistent with the HEP, and took some pain meds this morning because it was sore"   Currently in Pain? Yes   Pain Score 0-No pain  took pain meds at 8:30 due to pain being 9/10   Pain Location Shoulder   Pain Orientation Right   Pain Descriptors / Indicators Throbbing   Pain Type Surgical pain   Pain Onset More than a month ago   Pain Frequency Intermittent                         [facility] Adult PT Treatment/Exercise - 06/05/15 1023    Shoulder Exercises: Supine   Protraction AROM;Strengthening;10 reps  5# keeping elbow straight   Shoulder Exercises: Standing   External Rotation AROM;Strengthening;Right;10 reps;Theraband   Theraband Level (Shoulder External Rotation) Level 3 (Green)   Internal Rotation AROM;Strengthening;Right;10 reps;Theraband   Theraband Level (Shoulder Internal Rotation) Level 3 (Green)   Flexion AROM;Strengthening;Right;10 reps   Theraband Level (Shoulder Flexion) Level 3 (Green)   Extension AROM;Strengthening;Right;10 reps;Theraband   Theraband Level (Shoulder Extension) Level 3 (Green)   Row  AROM;Strengthening;10 reps;Both;Theraband   Theraband Level (Shoulder Row) Level 3 (Green)   Shoulder Exercises: ROM/Strengthening   UBE (Upper Arm Bike) L1.5 x 6 min  alt dir every 2 min   Other ROM/Strengthening Exercises shoulder flexion with bar x 10 with 5 sec hold at end range.    Shoulder Exercises: Stretch   Corner Stretch 2 reps;30 seconds   Other Shoulder Stretches shoulder IR behind back with wand x 10 with 5 sec hold at end range   Moist Heat Therapy   Number Minutes Moist Heat 10 Minutes   Moist Heat Location Shoulder   Electrical Stimulation   Electrical Stimulation  Location Rt shoulder   Electrical Stimulation Action IFC   Electrical Stimulation Parameters 10 min, 100% scan, to tolerance   Electrical Stimulation Goals Pain   Manual Therapy   Joint Mobilization GH and scapular mobilizations in all planes grade 2-3                 PT Education - 06/05/15 1052    Education provided Yes   Education Details upper trap stretch added to HEP, HEP review, educated on continued progression UE use around the house.    Person(s) Educated Patient   Methods Explanation   Comprehension Verbalized understanding          PT Short Term Goals - 06/03/15 1229    PT SHORT TERM GOAL #1   Title pt will be I with basic HEP 05/19/2015   Time 4   Period Weeks   Status Achieved   PT SHORT TERM GOAL #2   Title pt will decrease pain to <5/10 during PROM/AAROM to assist with exercise progression 05/19/2015   Baseline 9/10 during movement   Time 4   Period Weeks   Status On-going   PT SHORT TERM GOAL #3   Title pt will Increase R shoulder PROM flexion and abduction by >20 degrees to asisst with ADLs 05/19/2015   Baseline 135 degrees flexion PROM   Time 4   Period Weeks   Status On-going   PT SHORT TERM GOAL #4   Title She will be able to verbalize and demonstrate techniques to control R shoulder inflammation via RICE method 05/19/2015   Baseline Uses heat at home , understands use of RICE   Time 4   Period Weeks   Status Achieved   PT SHORT TERM GOAL #5   Title She will increase her FOTO score by > 10 points to help with functional capacity 05/19/2015   Time 4   Period Weeks   Status On-going           PT Long Term Goals - 06/03/15 1230    PT LONG TERM GOAL #1   Title She will be I with advanced HEP (06/17/15)   Time 8   Period Weeks   Status On-going   PT LONG TERM GOAL #2   Title She will demonstrate < 3/10 pain during and following AROM of the R shoulder to assist with ADLs (06/17/15)   Time 8   Period Weeks   Status On-going   PT LONG TERM GOAL  #3   Title She will demonstrate functional R shoulder AROM compared bil to assist with personal hygiene and ADLs (06/17/15)   Time 8   Period Weeks   Status On-going   PT LONG TERM GOAL #4   Title She will increase R grip strength to > 60 # to help with funcitonal exercise progression (06/17/15)  Time 8   Period Weeks   Status On-going   PT LONG TERM GOAL #5   Title she will be able to verbalize and demonstrate techniques to reduce risk or R shoulder reinjury via lifting and carry mechanics, and HEP (06/17/15)   Time 8   Period Weeks   Status On-going               Plan - 06/05/15 1111    Clinical Impression Statement Shannon Newton continues to make great progress with improved AROM. She reports trying to do more around the house and reports being a little more sore since. she tolerated all exercises well with no report of increased pain or stiffness. educated on continue progressing use of the UE around the house and performed HEP review.    PT Next Visit Plan shoulder mobility, scapular stability and shoulder strengthening, KT tape, modalities PRN, FOTO, FOTO,FOTO   PT Home Exercise Plan HEP review.    Consulted and Agree with Plan of Care Patient        Problem List Patient Active Problem List   Diagnosis Date Noted  . Vitamin D deficiency 03/13/2015  . Urinary incontinence 02/25/2015  . Rotator cuff arthropathy 06/21/2014  . Health care maintenance 06/21/2014  . Hypertension 06/21/2014  . OSA (obstructive sleep apnea) 07/19/2013  . Obese 07/19/2013  . OSTEOARTHRITIS 12/03/2006  . Hypothyroidism 10/21/2006  . DEPRESSION 10/21/2006   Lulu Riding PT, DPT, LAT, ATC  06/05/2015  12:39 PM    [facility] Outpatient Rehabilitation [facility] [address] [place], [place], [address] Phone: [phone]   Fax:  [phone]

## 2015-06-05 NOTE — Assessment & Plan Note (Signed)
She was found to be vitamin D deficient in March 2016 and she was started on vitamin D supplementation with 50,000 units weekly for 8 weeks, which she has completed. I will switch her to vitamin D 800 units daily.

## 2015-06-05 NOTE — Assessment & Plan Note (Signed)
Assessment: Pt with normal TSH level on 10/15/14 compliant with thyroid replacement therapy who presents with symptoms of hypothyroidism.   Plan: -continue with synthroid 88 mcg daily  -Continue to monitor thyroid function

## 2015-06-05 NOTE — Assessment & Plan Note (Signed)
Immunisations: Flu shot: Uptodate Tdap: Given today  Indicated Cancer Screening: Colonoscopy: will refer to GI. Meanwhile get home FOBT Pap smear: will do this next visit after she recovers from her shoulder surgery  Life style changes addressed: Smoking status: not smoker Obesity: discussed wt loss.

## 2015-06-05 NOTE — Assessment & Plan Note (Signed)
Pain is well controlled. Currently undergoing physical therapy. She is wearing an arm sling. She has some prescription of Percocet and Flexeril. She states that Percocet makes her sometimes sleepy. She's been using it sparingly since her surgery. I will discontinue the Flexeril and recommended topical muscle relaxant which she can get over-the-counter. Continue with physical therapy.

## 2015-06-05 NOTE — Patient Instructions (Signed)
   Kristoffer Leamon PT, DPT, LAT, ATC  [facility] Phone: [phone]     

## 2015-06-05 NOTE — Progress Notes (Signed)
Patient ID: Shannon Newton, female   DOB: 1966-11-09, 49 y.o.   MRN: [id number]   Subjective:   HPI: [name] is a 49 y.o. woman with past medical history below presents for a follow-up visit requesting refill of her medications.  No new complaints.  Kindly see the A&P for the status of the pt's chronic medical problems.   Past Medical History  Diagnosis Date  . Depression   . Hypertension   . Abdominal pain, recurrent 2009    per CT of the abd/pelvis (01/26/2008) -  Diffuse inflammatory change of the descending and rectosigmoid colon.  Additional inflammatory changes within the terminal ileum raise concern for inflammatory bowel disease (Crohn's disease).  . Hypothyroidism 2003    Thyroid US done in 2003 (results in [place]) - showed increased 24 hour uptake and the findings were consistent with Grave's disease; patient is s/p RAI therapy (09/19/2002)  . Back pain, chronic 2001    per MRI in 2001 - disc protrusion L5-6, to the right  . Complication of anesthesia     hard to wake up-sob-possible sleep apnea  . Urinary frequency   . Sickle cell trait     ROS: Constitutional:  Denies fevers, chills, diaphoresis, appetite change. Reports chronic fatigue. Weight has been stable Respiratory: Denies SOB, DOE, cough, chest tightness, and wheezing.  CVS: No chest pain, palpitations and leg swelling.  GI: No abdominal pain, nausea, vomiting, bloody stools GU: No dysuria, frequency, hematuria, or flank pain.  MSK: Reports pain in the right shoulder with spasm since her surgery. No myalgias, back pain, joint swelling, arthralgias  Psych: No depression symptoms. No SI or SA.    Objective:  Physical Exam: Filed Vitals:   06/05/15 1417  BP: 134/80  Pulse: 87  Temp: 98 F (36.7 C)  TempSrc: Oral  Height: 5\' 1"  (1.549 m)  Weight: 219 lb (99.338 kg)  SpO2: 100%   General: Well nourished. No acute distress.  HEENT: Normal oral mucosa. MMM.  Lungs: CTA bilaterally. No  wheezing. Heart: RRR; no extra sounds or murmurs  Abdomen: Non-distended, normal bowel sounds, soft, nontender; no hepatosplenomegaly  Extremities: She has an arm sling on her right shoulder. No pedal edema. No joint swelling or tenderness. Neurologic: Normal EOM,  Alert and oriented x3. No obvious neurologic/cranial nerve deficits.  Assessment & Plan:  Discussed case with Dr Heide Spark See problem based charting for assessment and plan.

## 2015-06-05 NOTE — Patient Instructions (Signed)
General Instructions: Please use muscle spasm cream over the counter  Please take Vitamin D 800 units daily Please come back for a recheck in 6 month   Please bring your medicines with you each time you come to clinic.  Medicines may include prescription medications, over-the-counter medications, herbal remedies, eye drops, vitamins, or other pills.   Progress Toward Treatment Goals:  No flowsheet data found.  Self Care Goals & Plans:  Self Care Goal 06/05/2015  Manage my medications take my medicines as prescribed; bring my medications to every visit; refill my medications on time; follow the sick day instructions if I am sick  Eat healthy foods eat more vegetables; eat fruit for snacks and desserts; eat smaller portions  Be physically active find an activity I enjoy    No flowsheet data found.   Care Management & Community Referrals:  No flowsheet data found.

## 2015-06-05 NOTE — Progress Notes (Signed)
INTERNAL MEDICINE TEACHING ATTENDING ADDENDUM - Makayle Krahn, MD: I reviewed and discussed at the time of visit with the resident Dr. Kazibwe, the patient's medical history, physical examination, diagnosis and results of pertinent tests and treatment and I agree with the patient's care as documented.  

## 2015-06-05 NOTE — Assessment & Plan Note (Signed)
Pt with well-controlled hypertension compliant with on Amlodipine 10 mg daily. Continue.

## 2015-06-10 ENCOUNTER — Ambulatory Visit: Payer: No Typology Code available for payment source | Admitting: Physical Therapy

## 2015-06-10 DIAGNOSIS — R29898 Other symptoms and signs involving the musculoskeletal system: Secondary | ICD-10-CM

## 2015-06-10 DIAGNOSIS — M25511 Pain in right shoulder: Secondary | ICD-10-CM

## 2015-06-10 DIAGNOSIS — M25611 Stiffness of right shoulder, not elsewhere classified: Secondary | ICD-10-CM

## 2015-06-10 NOTE — Patient Instructions (Signed)
Gave patient RED and GREEN band  Resisted External Rotation: in Neutral - Bilateral   Sit or stand, tubing in both hands, elbows at sides, bent to 90, forearms forward. Pinch shoulder blades together and rotate forearms out. Keep elbows at sides. Repeat __10__ times per set. Do __1-2__ sets per session. Do _2___ sessions per day.  http://orth.exer.us/967   Copyright  VHI. All rights reserved.  Resisted Horizontal Abduction: Bilateral   Sit or stand, tubing in both hands, arms out in front. Keeping arms straight, pinch shoulder blades together and stretch arms out. Repeat ____10-20 times per set. Do _1-2___ sets per session. Do ___2_ sessions per day.  http://orth.exer.us/969   Copyright  VHI. All rights reserved.

## 2015-06-10 NOTE — Therapy (Signed)
[facility] [address] [place], [place], [address] Phone: [phone]   Fax:  [phone]  Physical Therapy Treatment  Patient Details  Name: Shannon Newton MRN: [id number] Date of Birth: Nov 22, 1966 Referring Provider:  Dow Adolph, MD  Encounter Date: 06/10/2015      PT End of Session - 06/10/15 1016    Visit Number 12   Number of Visits 16   Date for PT Re-Evaluation 06/17/15   PT Start Time 1016   PT Stop Time 1115   PT Time Calculation (min) 59 min   Activity Tolerance Patient tolerated treatment well      Past Medical History  Diagnosis Date  . Depression   . Hypertension   . Abdominal pain, recurrent 2009    per CT of the abd/pelvis (01/26/2008) -  Diffuse inflammatory change of the descending and rectosigmoid colon.  Additional inflammatory changes within the terminal ileum raise concern for inflammatory bowel disease (Crohn's disease).  . Hypothyroidism 2003    Thyroid US done in 2003 (results in [place]) - showed increased 24 hour uptake and the findings were consistent with Grave's disease; patient is s/p RAI therapy (09/19/2002)  . Back pain, chronic 2001    per MRI in 2001 - disc protrusion L5-6, to the right  . Complication of anesthesia     hard to wake up-sob-possible sleep apnea  . Urinary frequency   . Sickle cell trait     Past Surgical History  Procedure Laterality Date  . Tubal ligation    . Cystocele repair  2004    done by Dr. Su Grand  . Bladder suspension  2004  . Cystoscopy  2008    revision bladder sling  . Revision urinary sling  2006    sling replaced  . Shoulder arthroscopy with subacromial decompression Right 04/03/2015    Procedure: RIGHT SHOULDER ARTHROSCOPY WITH SUBACROMIAL DECOMPRESSION, DEBRIDEMENT ;  Surgeon: Tarry Kos, MD;  Location: [facility];  Service: Orthopedics;  Laterality: Right;    There were no vitals filed for this visit.  Visit  Diagnosis:  Pain in joint, shoulder region, right  Shoulder stiffness, right  Weakness of right arm  Decreased ROM of right shoulder          [facility] PT Assessment - 06/10/15 1101    Strength   Right Shoulder Flexion 3+/5  in avail ROM   Right Shoulder Internal Rotation 3+/5  pain   Right Shoulder External Rotation 3+/5  pain           [facility] Adult PT Treatment/Exercise - 06/10/15 1034    Shoulder Exercises: Standing   Horizontal ABduction Strengthening;Both;15 reps;Theraband   Theraband Level (Shoulder Horizontal ABduction) Level 2 (Red)   External Rotation Strengthening;Right;10 reps   Theraband Level (Shoulder External Rotation) Level 3 (Green)   Internal Rotation Strengthening;Right;10 reps   Theraband Level (Shoulder Internal Rotation) Level 3 (Green)   Flexion Strengthening;Right;10 reps   Theraband Level (Shoulder Flexion) Level 3 (Green)   Shoulder Flexion Weight (lbs) 2 sets, 1 set with red    Extension AROM;Strengthening;Right;10 reps;Theraband   Theraband Level (Shoulder Extension) Level 3 (Green)   Row AROM;Strengthening;10 reps;Both;Theraband   Theraband Level (Shoulder Row) Level 3 (Green)   Other Standing Exercises done bilat unattached x 10   Other Standing Exercises scap elevation and depression, circles using blue band    Shoulder Exercises: ROM/Strengthening   UBE (Upper Arm Bike) L2 x 6 min  alt dir every  2 min   Manual Therapy   Manual Therapy Taping   Joint Mobilization GH mod gr 2-3   Passive ROM all planes   Kinesiotex Facilitate Muscle   Kinesiotix   Facilitate Muscle  3 Y's Rt. UE                 PT Education - 06/10/15 1219    Education provided Yes   Education Details upgraded bands for strength, scapular stab.    Person(s) Educated Patient   Methods Explanation;Demonstration;Handout   Comprehension Verbalized understanding;Returned demonstration          PT Short Term Goals - 06/10/15 1225    PT SHORT TERM GOAL #4    Title She will be able to verbalize and demonstrate techniques to control R shoulder inflammation via RICE method 05/19/2015   Status Achieved   PT SHORT TERM GOAL #5   Title She will increase her FOTO score by > 10 points to help with functional capacity 05/19/2015   Status Achieved           PT Long Term Goals - 06/03/15 1230    PT LONG TERM GOAL #1   Title She will be I with advanced HEP (06/17/15)   Time 8   Period Weeks   Status On-going   PT LONG TERM GOAL #2   Title She will demonstrate < 3/10 pain during and following AROM of the R shoulder to assist with ADLs (06/17/15)   Time 8   Period Weeks   Status On-going   PT LONG TERM GOAL #3   Title She will demonstrate functional R shoulder AROM compared bil to assist with personal hygiene and ADLs (06/17/15)   Time 8   Period Weeks   Status On-going   PT LONG TERM GOAL #4   Title She will increase R grip strength to > 60 # to help with funcitonal exercise progression (06/17/15)   Time 8   Period Weeks   Status On-going   PT LONG TERM GOAL #5   Title she will be able to verbalize and demonstrate techniques to reduce risk or R shoulder reinjury via lifting and carry mechanics, and HEP (06/17/15)   Time 8   Period Weeks   Status On-going               Plan - 06/10/15 1220    Clinical Impression Statement Shannon Newton is tolerant of manual and PRE.  K Tape instantly helps pt feel more supported in Rt. UE.  She does report fatigue and inability (at times) to lift Rt. hand for meals, simple reaching. Encouraged her to continue to build on HEP and monitor her pain, mobility.    PT Next Visit Plan repeat tape, scap stability, check horiz. abd and possibly use wall as a cue. , renew upcoming. check grip AROM goals    PT Home Exercise Plan cont with red/green band   Consulted and Agree with Plan of Care Patient        Problem List Patient Active Problem List   Diagnosis Date Noted  . Vitamin D deficiency 03/13/2015  . Urinary  incontinence 02/25/2015  . Rotator cuff arthropathy 06/21/2014  . Health care maintenance 06/21/2014  . Hypertension 06/21/2014  . OSA (obstructive sleep apnea) 07/19/2013  . Obese 07/19/2013  . OSTEOARTHRITIS 12/03/2006  . Hypothyroidism 10/21/2006  . DEPRESSION 10/21/2006    PAA,JENNIFER 06/10/2015, 12:27 PM  [facility] [address] [place], [place], [address]  Phone: [phone]   Fax:  [phone]   Karie MainlandJennifer Paa, PT 06/10/2015 12:28 PM Phone: [phone] Fax: [phone]

## 2015-06-12 ENCOUNTER — Ambulatory Visit: Payer: No Typology Code available for payment source | Admitting: Physical Therapy

## 2015-06-12 DIAGNOSIS — R29898 Other symptoms and signs involving the musculoskeletal system: Secondary | ICD-10-CM

## 2015-06-12 DIAGNOSIS — M25511 Pain in right shoulder: Secondary | ICD-10-CM

## 2015-06-12 DIAGNOSIS — M25611 Stiffness of right shoulder, not elsewhere classified: Secondary | ICD-10-CM

## 2015-06-12 NOTE — Therapy (Signed)
[facility] [address] [place], [place], [address] Phone: [phone]   Fax:  [phone]  Physical Therapy Treatment / Re-evaluation  Patient Details  Name: Shannon Newton MRN: [id number] Date of Birth: 07/20/1966 Referring Provider:  Xu, Naiping M, MD  Encounter Date: 06/12/2015      PT End of Session - 06/12/15 1020    Visit Number 13   Number of Visits 25   Date for PT Re-Evaluation 07/24/15   PT Start Time 1015   PT Stop Time 1115   PT Time Calculation (min) 60 min   Activity Tolerance Patient tolerated treatment well   Behavior During Therapy WFL for tasks assessed/performed      Past Medical History  Diagnosis Date  . Depression   . Hypertension   . Abdominal pain, recurrent 2009    per CT of the abd/pelvis (01/26/2008) -  Diffuse inflammatory change of the descending and rectosigmoid colon.  Additional inflammatory changes within the terminal ileum raise concern for inflammatory bowel disease (Crohn's disease).  . Hypothyroidism 2003    Thyroid US done in 2003 (results in [facility]) - showed increased 24 hour uptake and the findings were consistent with Grave's disease; patient is s/p RAI therapy (09/19/2002)  . Back pain, chronic 2001    per MRI in 2001 - disc protrusion L5-6, to the right  . Complication of anesthesia     hard to wake up-sob-possible sleep apnea  . Urinary frequency   . Sickle cell trait     Past Surgical History  Procedure Laterality Date  . Tubal ligation    . Cystocele repair  2004    done by Dr. Marc Nesi  . Bladder suspension  2004  . Cystoscopy  2008    revision bladder sling  . Revision urinary sling  2006    sling replaced  . Shoulder arthroscopy with subacromial decompression Right 04/03/2015    Procedure: RIGHT SHOULDER ARTHROSCOPY WITH SUBACROMIAL DECOMPRESSION, DEBRIDEMENT ;  Surgeon: Naiping M Xu, MD;  Location: [facility];  Service: Orthopedics;  Laterality:  Right;    There were no vitals filed for this visit.  Visit Diagnosis:  Pain in joint, shoulder region, right - Plan: PT plan of care cert/re-cert  Shoulder stiffness, right - Plan: PT plan of care cert/re-cert  Weakness of right arm - Plan: PT plan of care cert/re-cert  Decreased ROM of right shoulder - Plan: PT plan of care cert/re-cert      Subjective Assessment - 06/12/15 1021    Subjective "things are going fine, I guess. It still hurts when I try to wash  dishes"   Currently in Pain? Yes   Pain Score 2   took pain medication @ 8:40am   Pain Location Shoulder   Pain Orientation Right   Pain Descriptors / Indicators Aching;Sore   Pain Type Surgical pain   Pain Onset More than a month ago   Pain Frequency Intermittent   Aggravating Factors  sleeping on it, reaching up, and cleaing the counters   Pain Relieving Factors pain meds, Heat and rest.             [facility] PT Assessment - 06/12/15 1025    Assessment   Medical Diagnosis S/P R rotator cuff repair    Onset Date/Surgical Date 04/04/15   Hand Dominance Right   Next MD Visit 07/05/2015   Prior Therapy yes   Precautions   Precautions Shoulder   Restrictions   Weight   Bearing Restrictions No   Balance Screen   Has the patient fallen in the past 6 months No   Has the patient had a decrease in activity level because of a fear of falling?  No   Is the patient reluctant to leave their home because of a fear of falling?  No   Prior Function   Level of Independence Independent;Independent with basic ADLs   Observation/Other Assessments   Focus on Therapeutic Outcomes (FOTO)  55%   ROM / Strength   AROM / PROM / Strength AROM   AROM   AROM Assessment Site Shoulder   Right/Left Shoulder Right;Left   Right Shoulder Extension 45 Degrees   Right Shoulder Flexion 128 Degrees   Right Shoulder ABduction 105 Degrees  pain during testing   Right Shoulder Internal Rotation 48 Degrees   Right Shoulder External Rotation 30  Degrees   Left Shoulder Extension 62 Degrees   Left Shoulder Flexion 148 Degrees   Left Shoulder ABduction 152 Degrees   Left Shoulder Internal Rotation 58 Degrees   Left Shoulder External Rotation 90 Degrees   Strength   Right Shoulder Flexion 3+/5  in available ROM   Right Shoulder Internal Rotation 3+/5  in available ROM   Right Shoulder External Rotation 3+/5  in Available ROM                     [facility] Adult PT Treatment/Exercise - 06/12/15 0001    Shoulder Exercises: Supine   Protraction AROM;Strengthening;10 reps   Horizontal ABduction AROM;Strengthening;Both;10 reps  2 sets   Theraband Level (Shoulder Horizontal ABduction) Level 2 (Red)   Shoulder Exercises: Standing   External Rotation Strengthening;Right;10 reps   Theraband Level (Shoulder External Rotation) Level 3 (Green)   Internal Rotation Strengthening;Right;10 reps   Theraband Level (Shoulder Internal Rotation) Level 3 (Green)   Flexion Strengthening;Right;10 reps   Theraband Level (Shoulder Flexion) Level 3 (Green)   Extension AROM;Strengthening;Right;10 reps;Theraband   Shoulder Exercises: ROM/Strengthening   Other ROM/Strengthening Exercises serratus, 5 reps 2 sets,    Other ROM/Strengthening Exercises shoulder flexion with bar x 10 with 5 sec hold at end range.    Manual Therapy   Joint Mobilization GH mod gr 2-3   Passive ROM all planes                PT Education - 06/12/15 1111    Education provided Yes   Education Details HEP review, added towel IR, and wall walks for abd/flex with controlled eccentric contraciton   Person(s) Educated Patient   Methods Explanation   Comprehension Verbalized understanding          PT Short Term Goals - 06/12/15 1116    PT SHORT TERM GOAL #1   Title pt will be I with basic HEP 05/19/2015   Time 4   Period Weeks   Status Achieved   PT SHORT TERM GOAL #2   Title pt will decrease pain to <5/10 during PROM/AAROM to assist with exercise  progression 05/19/2015   Baseline 9/10 during movement   Time 4   Period Weeks   Status On-going   PT SHORT TERM GOAL #3   Title pt will Increase R shoulder PROM flexion and abduction by >20 degrees to asisst with ADLs 05/19/2015   Baseline 135 degrees flexion PROM   Time 4   Period Weeks   Status On-going   PT SHORT TERM GOAL #4   Title She will be able to verbalize and  demonstrate techniques to control R shoulder inflammation via RICE method 05/19/2015   Baseline Uses heat at home , understands use of RICE   Time 4   Period Weeks   Status Achieved   PT SHORT TERM GOAL #5   Title She will increase her FOTO score by > 10 points to help with functional capacity 05/19/2015   Time 4   Period Weeks   Status Achieved           PT Long Term Goals - 06/12/15 1117    PT LONG TERM GOAL #1   Title She will be I with advanced HEP (06/17/15)   Time 8   Period Weeks   Status On-going   PT LONG TERM GOAL #2   Title She will demonstrate < 3/10 pain during and following AROM of the R shoulder to assist with ADLs (06/17/15)   Time 8   Period Weeks   Status On-going   PT LONG TERM GOAL #3   Title She will demonstrate functional R shoulder AROM compared bil to assist with personal hygiene and ADLs (06/17/15)   Time 8   Period Weeks   Status On-going   PT LONG TERM GOAL #4   Title She will increase R grip strength to > 60 # to help with funcitonal exercise progression (06/17/15)   Time 8   Period Weeks   Status On-going   PT LONG TERM GOAL #5   Title she will be able to verbalize and demonstrate techniques to reduce risk or R shoulder reinjury via lifting and carry mechanics, and HEP (06/17/15)   Time 8   Period Weeks   Status On-going               Plan - 06/12/15 1112    Clinical Impression Statement Naylea presents to therapy today with report of 2/10 pain. She is making progress wiht AROM of the R shoulder, and strength within the available AROM. pt met all STG except for 2, and 3. she  continues to report trying to do more at home but advised if the pain last for hours after she needs to not do as much. she would benefit from continued physical therapy to progress with POC. Added  towell assisted IR stretch, and wall walks with fingers and controlled eccentric lowering to progress mobility and strength   Pt will benefit from skilled therapeutic intervention in order to improve on the following deficits Decreased range of motion;Impaired flexibility;Improper body mechanics;Postural dysfunction;Pain;Impaired UE functional use;Increased muscle spasms;Decreased scar mobility;Decreased endurance;Increased edema;Decreased activity tolerance;Increased fascial restricitons;Decreased strength;Decreased mobility   PT Frequency 2x / week   PT Duration --  5 weeks   PT Treatment/Interventions ADLs/Self Care Home Management;Moist Heat;Therapeutic activities;Patient/family education;Scar mobilization;Visual/perceptual remediation/compensation;Therapeutic exercise;Passive range of motion;Dry needling;Manual techniques;Ultrasound;Cryotherapy;Electrical Stimulation;Neuromuscular re-education   PT Next Visit Plan shoulder mobility, include use of wall to facilitate eccentric loading, shoulder strengthening, KT tape PRN,    PT Home Exercise Plan  towell assisted IR stretch, and wall walks with fingers    Consulted and Agree with Plan of Care Patient        Problem List Patient Active Problem List   Diagnosis Date Noted  . Vitamin D deficiency 03/13/2015  . Urinary incontinence 02/25/2015  . Rotator cuff arthropathy 06/21/2014  . Health care maintenance 06/21/2014  . Hypertension 06/21/2014  . OSA (obstructive sleep apnea) 07/19/2013  . Obese 07/19/2013  . OSTEOARTHRITIS 12/03/2006  . Hypothyroidism 10/21/2006  . DEPRESSION 10/21/2006       PT, DPT, LAT, ATC  06/12/2015  11:24 AM   [facility] [address] [place], [place], [address] Phone: [phone]   Fax:  [phone]      

## 2015-06-12 NOTE — Patient Instructions (Signed)
   Kenitra Leventhal PT, DPT, LAT, ATC  [facility] Phone: [phone]     

## 2015-06-18 ENCOUNTER — Ambulatory Visit: Payer: No Typology Code available for payment source | Admitting: Physical Therapy

## 2015-06-20 ENCOUNTER — Ambulatory Visit: Payer: No Typology Code available for payment source | Attending: Orthopaedic Surgery | Admitting: Physical Therapy

## 2015-06-20 DIAGNOSIS — M25511 Pain in right shoulder: Secondary | ICD-10-CM | POA: Insufficient documentation

## 2015-06-20 DIAGNOSIS — M25611 Stiffness of right shoulder, not elsewhere classified: Secondary | ICD-10-CM | POA: Insufficient documentation

## 2015-06-20 DIAGNOSIS — R29898 Other symptoms and signs involving the musculoskeletal system: Secondary | ICD-10-CM

## 2015-06-20 NOTE — Therapy (Signed)
[facility] [address] [place], [place], [address] Phone: [phone]   Fax:  [phone]  Physical Therapy Treatment  Patient Details  Name: Shannon Newton MRN: [id number] Date of Birth: [birth year] Referring Provider:  Deneise LeverSaraiya, Parth, MD  Encounter Date: 06/20/2015      PT End of Session - 06/20/15 1122    Visit Number 14   Number of Visits 25   Date for PT Re-Evaluation 07/24/15   PT Start Time 1017   PT Stop Time 1128   PT Time Calculation (min) 71 min   Activity Tolerance Patient tolerated treatment well;Patient limited by pain   Behavior During Therapy [facility] for tasks assessed/performed      Past Medical History  Diagnosis Date  . Depression   . Hypertension   . Abdominal pain, recurrent 2009    per CT of the abd/pelvis (01/26/2008) -  Diffuse inflammatory change of the descending and rectosigmoid colon.  Additional inflammatory changes within the terminal ileum raise concern for inflammatory bowel disease (Crohn's disease).  . Hypothyroidism 2003    [facility] - showed increased 24 hour uptake and the findings were consistent with Grave's disease; patient is s/p RAI therapy (09/19/2002)  . Back pain, chronic 2001    per MRI in 2001 - disc protrusion L5-6, to the right  . Complication of anesthesia     hard to wake up-sob-possible sleep apnea  . Urinary frequency   . Sickle cell trait     Past Surgical History  Procedure Laterality Date  . Tubal ligation    . Cystocele repair  2004    done by Dr. Su GrandMarc Nesi  . Bladder suspension  2004  . Cystoscopy  2008    revision bladder sling  . Revision urinary sling  2006    sling replaced  . Shoulder arthroscopy with subacromial decompression Right 04/03/2015    Procedure: RIGHT SHOULDER ARTHROSCOPY WITH SUBACROMIAL DECOMPRESSION, DEBRIDEMENT ;  Surgeon: Tarry KosNaiping M Xu, MD;  Location: [facility];  Service: Orthopedics;   Laterality: Right;    There were no vitals filed for this visit.  Visit Diagnosis:  Pain in joint, shoulder region, right  Shoulder stiffness, right  Weakness of right arm  Decreased ROM of right shoulder      Subjective Assessment - 06/20/15 1023    Subjective Driving is painful.  5/10 even with pain meds  Working on overhead stretches sitting and standing and just before bed   Currently in Pain? Yes   Pain Score 5    Pain Location --  shoulder   Pain Orientation Right   Pain Descriptors / Indicators Spasm   Aggravating Factors  stretching    Pain Relieving Factors sleeping pills over the counter.  has not been using ice yet.   Effect of Pain on Daily Activities meds, heat  rest                         [facility] Adult PT Treatment/Exercise - 06/20/15 1031    Shoulder Exercises: Seated   External Rotation Limitations cued for techniques elbows needed to be closer to ribs, shoulders away from ears.   Shoulder Exercises: Pulleys   Flexion --  5 minutes   Shoulder Exercises: ROM/Strengthening   UBE (Upper Arm Bike) L2 X6  reverse each minute   Shoulder Exercises: Stretch   Internal Rotation Stretch 3 reps   Internal Rotation Stretch  Limitations 30 seconds with towel,  could not lift her arm after this ? pain and fatigue? able to bend elbow with pressing it to ribs prior to elbow flexion.   Wall Stretch - Flexion --  wall ;ladder 10 reps.  Spasm1 X   Other Shoulder Stretches shoulder abduction 5 reps, discontinued spasm in neck   Cryotherapy   Number Minutes Cryotherapy 20 Minutes   Cryotherapy Location Shoulder  neck, 10 minutes each   Manual Therapy   Manual Therapy Taping   Edema Management 1 fan anterior shoulder   Kinesiotex Edema;Inhibit Muscle   Kinesiotix   Inhibit Muscle  deltoid, supraspinatus 2 Y's                PT Education - 06/20/15 1121    Education provided Yes   Education Details technique for exercising   Person(s)  Educated Patient   Methods Explanation;Demonstration;Tactile cues;Verbal cues   Comprehension Verbalized understanding;Returned demonstration          PT Short Term Goals - 06/12/15 1116    PT SHORT TERM GOAL #1   Title pt will be I with basic HEP 05/19/2015   Time 4   Period Weeks   Status Achieved   PT SHORT TERM GOAL #2   Title pt will decrease pain to <5/10 during PROM/AAROM to assist with exercise progression 05/19/2015   Baseline 9/10 during movement   Time 4   Period Weeks   Status On-going   PT SHORT TERM GOAL #3   Title pt will Increase R shoulder PROM flexion and abduction by >20 degrees to asisst with ADLs 05/19/2015   Baseline 135 degrees flexion PROM   Time 4   Period Weeks   Status On-going   PT SHORT TERM GOAL #4   Title She will be able to verbalize and demonstrate techniques to control R shoulder inflammation via RICE method 05/19/2015   Baseline Uses heat at home , understands use of RICE   Time 4   Period Weeks   Status Achieved   PT SHORT TERM GOAL #5   Title She will increase her FOTO score by > 10 points to help with functional capacity 05/19/2015   Time 4   Period Weeks   Status Achieved           PT Long Term Goals - 06/20/15 1130    PT LONG TERM GOAL #1   Title She will be I with advanced HEP (06/17/15)   Time 8   Period Weeks   Status On-going   PT LONG TERM GOAL #2   Title She will demonstrate < 3/10 pain during and following AROM of the R shoulder to assist with ADLs (06/17/15)   Time 8   Period Weeks   Status On-going   PT LONG TERM GOAL #3   Title She will demonstrate functional R shoulder AROM compared bil to assist with personal hygiene and ADLs (06/17/15)   Period Weeks   Status On-going   PT LONG TERM GOAL #4   Title She will increase R grip strength to > 60 # to help with funcitonal exercise progression (06/17/15)   Baseline 41 average   Time 8   Period Weeks   Status On-going   PT LONG TERM GOAL #5   Title she will be able to  verbalize and demonstrate techniques to reduce risk or R shoulder reinjury via lifting and carry mechanics, and HEP (06/17/15)   Time 8   Period Weeks  Status Unable to assess               Plan - 06/20/15 1122    Clinical Impression Statement Rocky doing her home exercise program and reports increased pain in her neck post exercise.  0 pain post cold pack.  grip strength average 4 1 LBS RT.Progress toward grip strength goal.   PT Next Visit Plan shoulder mobility, include use of wall to facilitate eccentric loading, shoulder strengthening, KT tape PRN,    PT Home Exercise Plan continue to exercise, be sure to use ice post exercise.   Consulted and Agree with Plan of Care Patient        Problem List Patient Active Problem List   Diagnosis Date Noted  . Vitamin D deficiency 03/13/2015  . Urinary incontinence 02/25/2015  . Rotator cuff arthropathy 06/21/2014  . Health care maintenance 06/21/2014  . Hypertension 06/21/2014  . OSA (obstructive sleep apnea) 07/19/2013  . Obese 07/19/2013  . OSTEOARTHRITIS 12/03/2006  . Hypothyroidism 10/21/2006  . DEPRESSION 10/21/2006    HARRIS,KAREN 06/20/2015, 11:33 AM  [facility] [address] [place], [place], [address] Phone: [phone]   Fax:  [phone]     Liz Beach, PTA 06/20/2015 11:33 AM Phone: [phone] Fax: [phone]

## 2015-06-24 ENCOUNTER — Ambulatory Visit: Payer: No Typology Code available for payment source | Admitting: Physical Therapy

## 2015-06-24 DIAGNOSIS — R29898 Other symptoms and signs involving the musculoskeletal system: Secondary | ICD-10-CM

## 2015-06-24 DIAGNOSIS — M25611 Stiffness of right shoulder, not elsewhere classified: Secondary | ICD-10-CM

## 2015-06-24 NOTE — Therapy (Signed)
[place], [place], [address] Phone: [phone]   Fax:  [phone]  Physical Therapy Treatment  Patient Details  Name: Shannon Newton MRN: [id number] Date of Birth: 1965/12/18 Referring Provider:  Burgess Estelle, MD  Encounter Date: 06/24/2015      PT End of Session - 06/24/15 1219    Visit Number 15   Number of Visits 25   Date for PT Re-Evaluation 07/24/15   PT Start Time 0933   PT Stop Time 1018   PT Time Calculation (min) 45 min   Activity Tolerance Patient tolerated treatment well   Behavior During Therapy [facility] for tasks assessed/performed      Past Medical History  Diagnosis Date  . Depression   . Hypertension   . Abdominal pain, recurrent 2009    per CT of the abd/pelvis (01/26/2008) -  Diffuse inflammatory change of the descending and rectosigmoid colon.  Additional inflammatory changes within the terminal ileum raise concern for inflammatory bowel disease (Crohn's disease).  . Hypothyroidism 2003    Thyroid US done in 2003 (results in [place]) - showed increased 24 hour uptake and the findings were consistent with Grave's disease; patient is s/p RAI therapy (09/19/2002)  . Back pain, chronic 2001    per MRI in 2001 - disc protrusion L5-6, to the right  . Complication of anesthesia     hard to wake up-sob-possible sleep apnea  . Urinary frequency   . Sickle cell trait     Past Surgical History  Procedure Laterality Date  . Tubal ligation    . Cystocele repair  2004    done by Dr. Lowella Bandy  . Bladder suspension  2004  . Cystoscopy  2008    revision bladder sling  . Revision urinary sling  2006    sling replaced  . Shoulder arthroscopy with subacromial decompression Right 04/03/2015    Procedure: RIGHT SHOULDER ARTHROSCOPY WITH SUBACROMIAL DECOMPRESSION, DEBRIDEMENT ;  Surgeon: Leandrew Koyanagi, MD;  Location: [facility];  Service: Orthopedics;  Laterality: Right;     There were no vitals filed for this visit.  Visit Diagnosis:  Shoulder stiffness, right  Weakness of right arm  Decreased ROM of right shoulder      Subjective Assessment - 06/24/15 0948    Subjective no pain.  They prayed over me at church .  No pain meds. No pain with getting dressed.  This is the first time I didn't have pain. I slept so good.  Pain did not wake me up.  I slept on my shoulder.                         [facility] Adult PT Treatment/Exercise - 06/24/15 0936    Shoulder Exercises: Supine   Horizontal ABduction AROM  10 reps  ROM equal to LT   External Rotation AROM  10 reps,     Flexion AROM  10 reps pain free, ROM limited less than 10 % supine   Other Supine Exercises circles 10 reps in hooklying 10 reps each way   Shoulder Exercises: Pulleys   Flexion --  5 minutes.   Shoulder Exercises: ROM/Strengthening   UBE (Upper Arm Bike) L2 X 6 minutes  reversing each minute   Other ROM/Strengthening Exercises serratus anterior ) LBS 10 reps   Manual Therapy   Manual Therapy Taping   Edema Management 1 fan anterior shoulder   Joint Mobilization Grade  2-3 with distraction posterior and inferior capsule   Kinesiotex Edema;Inhibit Muscle   Kinesiotix   Inhibit Muscle  deltoid, supraspinatus 2 Y's                  PT Short Term Goals - 06/12/15 1116    PT SHORT TERM GOAL #1   Title pt will be I with basic HEP 05/19/2015   Time 4   Period Weeks   Status Achieved   PT SHORT TERM GOAL #2   Title pt will decrease pain to <5/10 during PROM/AAROM to assist with exercise progression 05/19/2015   Baseline 9/10 during movement   Time 4   Period Weeks   Status On-going   PT SHORT TERM GOAL #3   Title pt will Increase R shoulder PROM flexion and abduction by >20 degrees to asisst with ADLs 05/19/2015   Baseline 135 degrees flexion PROM   Time 4   Period Weeks   Status On-going   PT SHORT TERM GOAL #4   Title She will be able to verbalize and  demonstrate techniques to control R shoulder inflammation via RICE method 05/19/2015   Baseline Uses heat at home , understands use of RICE   Time 4   Period Weeks   Status Achieved   PT SHORT TERM GOAL #5   Title She will increase her FOTO score by > 10 points to help with functional capacity 05/19/2015   Time 4   Period Weeks   Status Achieved           PT Long Term Goals - 06/20/15 1130    PT LONG TERM GOAL #1   Title She will be I with advanced HEP (06/17/15)   Time 8   Period Weeks   Status On-going   PT LONG TERM GOAL #2   Title She will demonstrate < 3/10 pain during and following AROM of the R shoulder to assist with ADLs (06/17/15)   Time 8   Period Weeks   Status On-going   PT LONG TERM GOAL #3   Title She will demonstrate functional R shoulder AROM compared bil to assist with personal hygiene and ADLs (06/17/15)   Period Weeks   Status On-going   PT LONG TERM GOAL #4   Title She will increase R grip strength to > 60 # to help with funcitonal exercise progression (06/17/15)   Baseline 41 average   Time 8   Period Weeks   Status On-going   PT LONG TERM GOAL #5   Title she will be able to verbalize and demonstrate techniques to reduce risk or R shoulder reinjury via lifting and carry mechanics, and HEP (06/17/15)   Time 8   Period Weeks   Status Unable to assess               Plan - 06/24/15 1228    Clinical Impression Statement No pain.  More functional.  STG#2 met.   PT Next Visit Plan shoulder mobility, include use of wall to facilitate eccentric loading, shoulder strengthening, KT tape PRN,    Consulted and Agree with Plan of Care Patient        Problem List Patient Active Problem List   Diagnosis Date Noted  . Vitamin D deficiency 03/13/2015  . Urinary incontinence 02/25/2015  . Rotator cuff arthropathy 06/21/2014  . Health care maintenance 06/21/2014  . Hypertension 06/21/2014  . OSA (obstructive sleep apnea) 07/19/2013  . Obese 07/19/2013  .  OSTEOARTHRITIS 12/03/2006  .  Hypothyroidism 10/21/2006  . DEPRESSION 10/21/2006    HARRIS,KAREN 06/24/2015, 12:33 PM  [facility] [address] [place], [place], [address] Phone: [phone]   Fax:  [phone]     Melvenia Needles, PTA 06/24/2015 12:33 PM Phone: [phone] Fax: [phone]

## 2015-06-26 ENCOUNTER — Ambulatory Visit: Payer: No Typology Code available for payment source | Admitting: Physical Therapy

## 2015-06-26 DIAGNOSIS — M25611 Stiffness of right shoulder, not elsewhere classified: Secondary | ICD-10-CM

## 2015-06-26 DIAGNOSIS — M25511 Pain in right shoulder: Secondary | ICD-10-CM

## 2015-06-26 DIAGNOSIS — R29898 Other symptoms and signs involving the musculoskeletal system: Secondary | ICD-10-CM

## 2015-06-26 NOTE — Therapy (Signed)
[place], [place], [address] Phone: [phone]   Fax:  [phone]  Physical Therapy Treatment  Patient Details  Name: Shannon Newton MRN: [id number] Date of Birth: 1966-09-25 Referring Provider:  Burgess Estelle, MD  Encounter Date: 06/26/2015      PT End of Session - 06/26/15 1034    Visit Number 16   Number of Visits 25   Date for PT Re-Evaluation 07/24/15   PT Start Time 0936   PT Stop Time 1030   PT Time Calculation (min) 54 min   Activity Tolerance Patient tolerated treatment well   Behavior During Therapy [facility] for tasks assessed/performed      Past Medical History  Diagnosis Date  . Depression   . Hypertension   . Abdominal pain, recurrent 2009    per CT of the abd/pelvis (01/26/2008) -  Diffuse inflammatory change of the descending and rectosigmoid colon.  Additional inflammatory changes within the terminal ileum raise concern for inflammatory bowel disease (Crohn's disease).  . Hypothyroidism 2003    Thyroid US done in 2003 (results in [place]) - showed increased 24 hour uptake and the findings were consistent with Grave's disease; patient is s/p RAI therapy (09/19/2002)  . Back pain, chronic 2001    per MRI in 2001 - disc protrusion L5-6, to the right  . Complication of anesthesia     hard to wake up-sob-possible sleep apnea  . Urinary frequency   . Sickle cell trait     Past Surgical History  Procedure Laterality Date  . Tubal ligation    . Cystocele repair  2004    done by Dr. Lowella Bandy  . Bladder suspension  2004  . Cystoscopy  2008    revision bladder sling  . Revision urinary sling  2006    sling replaced  . Shoulder arthroscopy with subacromial decompression Right 04/03/2015    Procedure: RIGHT SHOULDER ARTHROSCOPY WITH SUBACROMIAL DECOMPRESSION, DEBRIDEMENT ;  Surgeon: Leandrew Koyanagi, MD;  Location: [facility];  Service: Orthopedics;  Laterality: Right;     There were no vitals filed for this visit.  Visit Diagnosis:  Shoulder stiffness, right  Weakness of right arm  Decreased ROM of right shoulder  Pain in joint, shoulder region, right      Subjective Assessment - 06/26/15 0945    Subjective No pain.  No sling for the first time.  Lost her green band    Currently in Pain? No/denies                         [facility] Adult PT Treatment/Exercise - 06/26/15 0952    Shoulder Exercises: Supine   Horizontal ABduction AROM  20 reps full motion   Flexion AROM   Flexion Limitations 10   Other Supine Exercises circles each way 10 reps   Other Supine Exercises triceps 2 set 10 , 3 LBS. cues.   Shoulder Exercises: Prone   Flexion 20 reps  AROM   Extension --  2 sets  , 3 lbs 1 setextended elbow/ flexed elbow   Shoulder Exercises: Sidelying   External Rotation 10 reps  3 lbs   ABduction 10 reps  2 sets 0 LBS.   Shoulder Exercises: Standing   External Rotation Strengthening   Theraband Level (Shoulder External Rotation) Level 3 (Green)  10 reps cued for scapular involvement   Other Standing Exercises wall slides scaption, flexion with hold and eccectric lowering.  cues needed initially 5 reps 2 sets.    Shoulder Exercises: Pulleys   Flexion --  3 minutes flexion, abduction   Cryotherapy   Number Minutes Cryotherapy 10 Minutes   Cryotherapy Location Shoulder   Type of Cryotherapy --  cold pack                  PT Short Term Goals - 06/26/15 1040    PT SHORT TERM GOAL #2   Title pt will decrease pain to <5/10 during PROM/AAROM to assist with exercise progression 05/19/2015   Time 4   Period Weeks   Status Achieved   PT SHORT TERM GOAL #3   Title pt will Increase R shoulder PROM flexion and abduction by >20 degrees to asisst with ADLs 05/19/2015   Baseline full   Time 4   Period Weeks   Status Achieved   PT SHORT TERM GOAL #5   Title She will increase her FOTO score by > 10 points to help with  functional capacity 05/19/2015   Time 4   Period Weeks   Status Achieved           PT Long Term Goals - 06/26/15 1041    PT LONG TERM GOAL #1   Title She will be I with advanced HEP (06/17/15)   Time 8   Period Weeks   Status On-going   PT LONG TERM GOAL #2   Title She will demonstrate < 3/10 pain during and following AROM of the R shoulder to assist with ADLs (06/17/15)   Time 8   Period Weeks   Status Achieved   PT LONG TERM GOAL #3   Title She will demonstrate functional R shoulder AROM compared bil to assist with personal hygiene and ADLs (06/17/15)   Time 8   Period Weeks   Status On-going   PT LONG TERM GOAL #4   Title She will increase R grip strength to > 60 # to help with funcitonal exercise progression (06/17/15)   Time 8   Period Weeks   Status Unable to assess   PT LONG TERM GOAL #5   Title she will be able to verbalize and demonstrate techniques to reduce risk or R shoulder reinjury via lifting and carry mechanics, and HEP (06/17/15)   Time 8   Period Weeks   Status Unable to assess               Plan - 06/26/15 0867    Clinical Impression Statement mild pain post exercise.  Patient continues to need cues to avoid compensation.  Adherent with home exercises.  Preogress toward functional goals. uses arm more at home for ADL's.   PT Next Visit Plan patient would like posture exercises.  strengthening ball on wall   Consulted and Agree with Plan of Care Patient     STG # 2,3 met. LTG # 2 met.   Problem List Patient Active Problem List   Diagnosis Date Noted  . Vitamin D deficiency 03/13/2015  . Urinary incontinence 02/25/2015  . Rotator cuff arthropathy 06/21/2014  . Health care maintenance 06/21/2014  . Hypertension 06/21/2014  . OSA (obstructive sleep apnea) 07/19/2013  . Obese 07/19/2013  . OSTEOARTHRITIS 12/03/2006  . Hypothyroidism 10/21/2006  . DEPRESSION 10/21/2006    Anant Agard 06/26/2015, 10:43 AM  [facility] [address] [place], [place], [address] Phone: [phone]   Fax:  [phone]     Melvenia Needles, PTA 06/26/2015 10:43 AM  Phone: [phone] Fax: [phone]

## 2015-07-01 ENCOUNTER — Ambulatory Visit: Payer: No Typology Code available for payment source | Admitting: Physical Therapy

## 2015-07-01 DIAGNOSIS — R29898 Other symptoms and signs involving the musculoskeletal system: Secondary | ICD-10-CM

## 2015-07-01 DIAGNOSIS — M25611 Stiffness of right shoulder, not elsewhere classified: Secondary | ICD-10-CM

## 2015-07-01 DIAGNOSIS — M25511 Pain in right shoulder: Secondary | ICD-10-CM

## 2015-07-01 NOTE — Patient Instructions (Signed)
Come in at lunch for taping if you need.

## 2015-07-01 NOTE — Therapy (Signed)
[facility] [address] [place], [place], [address] Phone: [phone]   Fax:  [phone]  Physical Therapy Treatment  Patient Details  Name: Shannon Newton MRN: [id number] Date of Birth: 11/16/1966 Referring Provider:  Deneise Lever, MD  Encounter Date: 07/01/2015  Visit number: 16 Date for  Re-evaluation:  07/24/15 Encounter Date 07/01/2015      PT End of Session - 07/01/15 1826    Activity Tolerance Patient tolerated treatment well   Behavior During Therapy [facility] for tasks assessed/performed      Past Medical History  Diagnosis Date  . Depression   . Hypertension   . Abdominal pain, recurrent 2009    per CT of the abd/pelvis (01/26/2008) -  Diffuse inflammatory change of the descending and rectosigmoid colon.  Additional inflammatory changes within the terminal ileum raise concern for inflammatory bowel disease (Crohn's disease).  . Hypothyroidism 2003    Thyroid US done in 2003 (results in [place]) - showed increased 24 hour uptake and the findings were consistent with Grave's disease; patient is s/p RAI therapy (09/19/2002)  . Back pain, chronic 2001    per MRI in 2001 - disc protrusion L5-6, to the right  . Complication of anesthesia     hard to wake up-sob-possible sleep apnea  . Urinary frequency   . Sickle cell trait     Past Surgical History  Procedure Laterality Date  . Tubal ligation    . Cystocele repair  2004    done by Dr. Su Grand  . Bladder suspension  2004  . Cystoscopy  2008    revision bladder sling  . Revision urinary sling  2006    sling replaced  . Shoulder arthroscopy with subacromial decompression Right 04/03/2015    Procedure: RIGHT SHOULDER ARTHROSCOPY WITH SUBACROMIAL DECOMPRESSION, DEBRIDEMENT ;  Surgeon: Tarry Kos, MD;  Location: [facility];  Service: Orthopedics;  Laterality: Right;    There were no vitals filed for this visit.  Visit Diagnosis:  Weakness of  right arm  Decreased ROM of right shoulder  Pain in joint, shoulder region, right  Shoulder stiffness, right      Subjective Assessment - 07/01/15 0932    Subjective Had itching under tape.  Better when removed.  See's  Md  Thursday.  Doing her exercises.  No pain now has pain with circular motion with washing dishes  5/10.  Using less sleeping meds.   Currently in Pain? No/denies   Pain Score 5    Pain Orientation Right;Anterior   Pain Descriptors / Indicators --  pain, tight   Aggravating Factors  circular motions with washing dishes   Pain Relieving Factors rest,    Multiple Pain Sites No                         [facility] Adult PT Treatment/Exercise - 07/01/15 0945    Elbow Exercises   Other elbow exercises velcro rolling 5/10 pain(Has carpal tunnel)  to assist with arm use ie washing dishes.   Shoulder Exercises: Seated   Other Seated Exercises grip RT average 57, Lt 69 LBS.   Shoulder Exercises: Standing   Theraband Level (Shoulder Flexion) Level 3 (Green)   Flexion Limitations 1 set   Extension AROM   Theraband Level (Shoulder Extension) Level 3 (Green)   Other Standing Exercises ER Level 3 band 10 reps    Shoulder Exercises: Pulleys   Flexion --  20 reps, flexion  Shoulder Exercises: ROM/Strengthening   UBE (Upper Arm Bike) L2 X 6 minutes reversig   Other ROM/Strengthening Exercises lat pull down 2 sets 10 AA for control PTA doing 50%   Other ROM/Strengthening Exercises serratus band 10 reps   Hand Exercises   Other Hand Exercises velcro work rolling, turns    Other Hand Exercises grip RT 55, 57,59, LT 72, 65, 70 LBS.   Cryotherapy   Number Minutes Cryotherapy 10 Minutes   Cryotherapy Location Shoulder   Type of Cryotherapy --  cold pack                  PT Short Term Goals - 06/26/15 1040    PT SHORT TERM GOAL #2   Title pt will decrease pain to <5/10 during PROM/AAROM to assist with exercise progression 05/19/2015   Time 4    Period Weeks   Status Achieved   PT SHORT TERM GOAL #3   Title pt will Increase R shoulder PROM flexion and abduction by >20 degrees to asisst with ADLs 05/19/2015   Baseline full   Time 4   Period Weeks   Status Achieved   PT SHORT TERM GOAL #5   Title She will increase her FOTO score by > 10 points to help with functional capacity 05/19/2015   Time 4   Period Weeks   Status Achieved           PT Long Term Goals - 07/01/15 1828    PT LONG TERM GOAL #4   Title She will increase R grip strength to > 60 # to help with funcitonal exercise progression (06/17/15)   Baseline 57 LBS RT average. improved from 41 LBS 1 week ago   Time 8   Period Weeks   Status On-going   PT LONG TERM GOAL #5   Status Unable to assess               Plan - 07/01/15 1826    Clinical Impression Statement Trying to wean from taping.  She will return later today for taping if she feels she needs.  Strength improving.  Patient reported today she has carpal tunnel and the pain interfers wit using her shoulder correctly.  IE pain with washing dishes.          Problem List Patient Active Problem List   Diagnosis Date Noted  . Vitamin D deficiency 03/13/2015  . Urinary incontinence 02/25/2015  . Rotator cuff arthropathy 06/21/2014  . Health care maintenance 06/21/2014  . Hypertension 06/21/2014  . OSA (obstructive sleep apnea) 07/19/2013  . Obese 07/19/2013  . OSTEOARTHRITIS 12/03/2006  . Hypothyroidism 10/21/2006  . DEPRESSION 10/21/2006    Keifer Habib 07/01/2015, 6:30 PM  [facility] [address] [place], [place], [address] Phone: [phone]   Fax:  [phone]     Liz BeachKaren Caddie Randle, PTA 07/01/2015 6:30 PM Phone: [phone] Fax: [phone]

## 2015-07-03 ENCOUNTER — Ambulatory Visit: Payer: No Typology Code available for payment source | Admitting: Physical Therapy

## 2015-07-08 ENCOUNTER — Ambulatory Visit: Payer: No Typology Code available for payment source | Admitting: Physical Therapy

## 2015-07-10 ENCOUNTER — Ambulatory Visit: Payer: No Typology Code available for payment source | Admitting: Physical Therapy

## 2015-07-10 DIAGNOSIS — M25611 Stiffness of right shoulder, not elsewhere classified: Secondary | ICD-10-CM

## 2015-07-10 DIAGNOSIS — R29898 Other symptoms and signs involving the musculoskeletal system: Secondary | ICD-10-CM

## 2015-07-10 DIAGNOSIS — M25511 Pain in right shoulder: Secondary | ICD-10-CM

## 2015-07-10 NOTE — Patient Instructions (Signed)
Return to sling per PT.

## 2015-07-10 NOTE — Therapy (Addendum)
[place], [place], [address] Phone: [phone]   Fax:  [phone]  Physical Therapy Treatment  Patient Details  Name: Shannon Newton MRN: [id number] Date of Birth: May 15, 1966 Referring Provider:  Burgess Estelle, MD  Encounter Date: 07/10/2015      PT End of Session - 07/10/15 1057    Visit Number 18   Number of Visits 25   Date for PT Re-Evaluation 07/24/15   PT Start Time 0935   PT Stop Time 1035   PT Time Calculation (min) 60 min   Activity Tolerance Patient limited by pain   Behavior During Therapy [facility] for tasks assessed/performed      Past Medical History  Diagnosis Date  . Depression   . Hypertension   . Abdominal pain, recurrent 2009    per CT of the abd/pelvis (01/26/2008) -  Diffuse inflammatory change of the descending and rectosigmoid colon.  Additional inflammatory changes within the terminal ileum raise concern for inflammatory bowel disease (Crohn's disease).  . Hypothyroidism 2003    Thyroid US done in 2003 (results in [place]) - showed increased 24 hour uptake and the findings were consistent with Grave's disease; patient is s/p RAI therapy (09/19/2002)  . Back pain, chronic 2001    per MRI in 2001 - disc protrusion L5-6, to the right  . Complication of anesthesia     hard to wake up-sob-possible sleep apnea  . Urinary frequency   . Sickle cell trait     Past Surgical History  Procedure Laterality Date  . Tubal ligation    . Cystocele repair  2004    done by Dr. Lowella Bandy  . Bladder suspension  2004  . Cystoscopy  2008    revision bladder sling  . Revision urinary sling  2006    sling replaced  . Shoulder arthroscopy with subacromial decompression Right 04/03/2015    Procedure: RIGHT SHOULDER ARTHROSCOPY WITH SUBACROMIAL DECOMPRESSION, DEBRIDEMENT ;  Surgeon: Leandrew Koyanagi, MD;  Location: [place];  Service: Orthopedics;  Laterality: Right;    There were  no vitals filed for this visit.  Visit Diagnosis:  Weakness of right arm  Pain in joint, shoulder region, right  Shoulder stiffness, right      Subjective Assessment - 07/10/15 0935    Subjective Pain constant. 5/10.  Think I overdid it doing housework.  Able to wash dishes, vacume and flod laundry.  Saw MD last Thursday and he said everything was fine and that I would need 2 more months of PT.   Pain Score 5    Pain Location Shoulder   Pain Orientation Right;Posterior   Pain Descriptors / Indicators Constant;Contraction;Sharp  it moves when I am at rest.   Aggravating Factors  doing too much cleaning at home..    Pain Relieving Factors trigger point helps some  , heat   Multiple Pain Sites No                         [facility] Adult PT Treatment/Exercise - 07/10/15 0935    Self-Care   Other Self-Care Comments  return to sling.  Use ice to manage pain.  Remove tape if irritating. refrain from using arm until cleared by MD.  Self trigger point release.   Shoulder Exercises: Pulleys   Flexion --  flexion 1-2 minutes , stopped due to uncomfortable   Manual Therapy   Manual Therapy Soft tissue mobilization;Taping  Manual therapy comments teres minur very tender, hard for patient to reach.  showed her how to Use wall corner with a folded towel. Taping to inhibit deltoid, supraspinatus, teres minor.  also for posture correction.   Kinesiotex Inhibit Muscle                  PT Short Term Goals - 06/26/15 1040    PT SHORT TERM GOAL #2   Title pt will decrease pain to <5/10 during PROM/AAROM to assist with exercise progression 05/19/2015   Time 4   Period Weeks   Status Achieved   PT SHORT TERM GOAL #3   Title pt will Increase R shoulder PROM flexion and abduction by >20 degrees to asisst with ADLs 05/19/2015   Baseline full   Time 4   Period Weeks   Status Achieved   PT SHORT TERM GOAL #5   Title She will increase her FOTO score by > 10 points to help with  functional capacity 05/19/2015   Time 4   Period Weeks   Status Achieved           PT Long Term Goals - 07/01/15 1828    PT LONG TERM GOAL #4   Title She will increase R grip strength to > 60 # to help with funcitonal exercise progression (06/17/15)   Baseline 57 LBS RT average. improved from 41 LBS 1 week ago   Time 8   Period Weeks   Status On-going   PT LONG TERM GOAL #5   Status Unable to assess               Plan - 07/10/15 1053    Clinical Impression Statement Pain flare with shoulder "moving and shifting while at rest"  PT checked shoulder, see his comments for his assessment.  Pain 5/10 .     upon further PT assessment she demontrates tenderness at the greater tubercle with referral of pain down toward the deltoid tubercle. AROM in caption with unresisted open/empty can were painful and she was unable to go through the motion, which during previous visits she was able to do without problems. Performed PROM she demonstrated popping and clicking at the greater tubercle with increased lasting pain. She reports this pain has started recently prior to coming to therapy and has gradually gotten worse with use. Patient reports no hx of re-injury that she can remember, reporting she only did cleaning her house, doing dishes, and laundry. Due to changes in status of pain at greater tubercle and painful popping/clicking and difficulty with just letting arm hang which has not been present for previous appts, PT called and got her an appointment with her surgeon on Friday 07/12/2015 for re-assessment. Advised pt to use her sling until she is seen by her dr.    [place] List Patient Active Problem List   Diagnosis Date Noted  . Vitamin D deficiency 03/13/2015  . Urinary incontinence 02/25/2015  . Rotator cuff arthropathy 06/21/2014  . Health care maintenance 06/21/2014  . Hypertension 06/21/2014  . OSA (obstructive sleep apnea) 07/19/2013  . Obese 07/19/2013  . OSTEOARTHRITIS  12/03/2006  . Hypothyroidism 10/21/2006  . DEPRESSION 10/21/2006    HARRIS,KAREN 07/10/2015, 11:04 AM  [facility] [address] [place], [place], [address] Phone: [phone]   Fax:  [phone]     Melvenia Needles, PTA 07/10/2015 11:04 AM Phone: [phone] Fax: [phone]   Starr Lake PT, DPT, LAT, ATC  07/10/2015  12:48 PM  PHYSICAL THERAPY DISCHARGE SUMMARY  Visits from Start of Care: 18   Current functional level related to goals / functional outcomes: FOTO 55% limitaton   Remaining deficits: R shoulder pain, muscle spasm   Education / Equipment: HEP, theraband for HEP  Plan: Patient agrees to discharge.  Patient goals were not met. Patient is being discharged due to financial reasons.  ?????          Kristoffer Leamon PT, DPT, LAT, ATC  07/11/2015  4:24 PM

## 2015-07-15 ENCOUNTER — Ambulatory Visit: Payer: No Typology Code available for payment source | Attending: Orthopaedic Surgery | Admitting: Physical Therapy

## 2015-07-15 DIAGNOSIS — R29898 Other symptoms and signs involving the musculoskeletal system: Secondary | ICD-10-CM

## 2015-07-15 DIAGNOSIS — M25511 Pain in right shoulder: Secondary | ICD-10-CM

## 2015-07-15 DIAGNOSIS — M25611 Stiffness of right shoulder, not elsewhere classified: Secondary | ICD-10-CM

## 2015-07-15 NOTE — Therapy (Signed)
[place], [place], [address] Phone: [phone]   Fax:  [phone]  Physical Therapy Treatment  Patient Details  Name: Shannon Newton MRN: [id number] Date of Birth: 25-Nov-1966 Referring Provider:  Burgess Estelle, MD  Encounter Date: 07/15/2015      PT End of Session - 07/15/15 1712    Visit Number 19   Number of Visits 25   Date for PT Re-Evaluation 07/24/15   PT Start Time 1500   PT Stop Time 1545   PT Time Calculation (min) 45 min   Activity Tolerance Patient tolerated treatment well   Behavior During Therapy [facility] for tasks assessed/performed      Past Medical History  Diagnosis Date  . Depression   . Hypertension   . Abdominal pain, recurrent 2009    per CT of the abd/pelvis (01/26/2008) -  Diffuse inflammatory change of the descending and rectosigmoid colon.  Additional inflammatory changes within the terminal ileum raise concern for inflammatory bowel disease (Crohn's disease).  . Hypothyroidism 2003    Thyroid US done in 2003 (results in [place]) - showed increased 24 hour uptake and the findings were consistent with Grave's disease; patient is s/p RAI therapy (09/19/2002)  . Back pain, chronic 2001    per MRI in 2001 - disc protrusion L5-6, to the right  . Complication of anesthesia     hard to wake up-sob-possible sleep apnea  . Urinary frequency   . Sickle cell trait     Past Surgical History  Procedure Laterality Date  . Tubal ligation    . Cystocele repair  2004    done by Dr. Lowella Bandy  . Bladder suspension  2004  . Cystoscopy  2008    revision bladder sling  . Revision urinary sling  2006    sling replaced  . Shoulder arthroscopy with subacromial decompression Right 04/03/2015    Procedure: RIGHT SHOULDER ARTHROSCOPY WITH SUBACROMIAL DECOMPRESSION, DEBRIDEMENT ;  Surgeon: Leandrew Koyanagi, MD;  Location: [place];  Service: Orthopedics;  Laterality: Right;     There were no vitals filed for this visit.  Visit Diagnosis:  Weakness of right arm  Pain in joint, shoulder region, right  Shoulder stiffness, right  Decreased ROM of right shoulder      Subjective Assessment - 07/15/15 1512    Subjective pt reports seeing her surgeon on Friday, and stated that he noted it muscle spams in the shoulder and gave her some muscle relaxers. "my shoulder is really bothering me today"   Currently in Pain? Yes   Pain Score 7    Pain Location Shoulder   Pain Orientation Right   Pain Descriptors / Indicators Sharp;Tightness   Pain Type Acute pain   Pain Onset More than a month ago   Pain Frequency Constant   Aggravating Factors  lifting, moving the arm, letting it hang   Pain Relieving Factors nothing            [facility] PT Assessment - 07/15/15 0001    AROM   Overall AROM  --  not assessed due to pain response   PROM   Right Shoulder Extension 10 Degrees  pain during motion   Right Shoulder Flexion 30 Degrees  pain during motion   Right Shoulder ABduction 22 Degrees  pain during motion   Right Shoulder Internal Rotation 18 Degrees  pain during motion   Right Shoulder External Rotation 20 Degrees  pain during movement  [facility] Adult PT Treatment/Exercise - 07/15/15 1534    Self-Care   Self-Care ADL's   ADL's To let shoulder rest and continue using sling until ROM increases and pain decreases.   to halt strengthening and focus on ROM with wand exercises   Moist Heat Therapy   Number Minutes Moist Heat 15 Minutes   Moist Heat Location Shoulder  in sitting   Electrical Stimulation   Electrical Stimulation Location Rt shoulder   Electrical Stimulation Action IFC   Electrical Stimulation Parameters 100% scan, Level 18, x 15 min   Electrical Stimulation Goals Pain   Manual Therapy   Passive ROM all planes                PT Education - 07/15/15 1711    Education provided Yes   Education  Details to stop strengthening exercises and begin wand exercises again to get ROM back   Person(s) Educated Patient   Methods Explanation   Comprehension Verbalized understanding          PT Short Term Goals - 07/15/15 1725    PT SHORT TERM GOAL #1   Title pt will be I with basic HEP 05/19/2015   Time 4   Period Weeks   Status Achieved   PT SHORT TERM GOAL #2   Title pt will decrease pain to <5/10 during PROM/AAROM to assist with exercise progression 05/19/2015   Baseline 9/10 during movement   Time 4   Period Weeks   Status Achieved   PT SHORT TERM GOAL #3   Title pt will Increase R shoulder PROM flexion and abduction by >20 degrees to asisst with ADLs 05/19/2015   Baseline full   Time 4   Period Weeks   Status Achieved   PT SHORT TERM GOAL #4   Title She will be able to verbalize and demonstrate techniques to control R shoulder inflammation via RICE method 05/19/2015   Baseline Uses heat at home , understands use of RICE   Time 4   Period Weeks   Status New   PT SHORT TERM GOAL #5   Title She will increase her FOTO score by > 10 points to help with functional capacity 05/19/2015   Time 4   Period Weeks   Status Achieved           PT Long Term Goals - 07/15/15 1725    PT LONG TERM GOAL #1   Title She will be I with advanced HEP (06/17/15)   Time 8   Period Weeks   Status On-going   PT LONG TERM GOAL #2   Title She will demonstrate < 3/10 pain during and following AROM of the R shoulder to assist with ADLs (06/17/15)   Time 8   Period Weeks   Status Achieved   PT LONG TERM GOAL #3   Title She will demonstrate functional R shoulder AROM compared bil to assist with personal hygiene and ADLs (06/17/15)   Time 8   Period Weeks   Status On-going   PT LONG TERM GOAL #4   Title She will increase R grip strength to > 60 # to help with funcitonal exercise progression (06/17/15)   Baseline 57 LBS RT average. improved from 41 LBS 1 week ago   Time 8   Period Weeks   Status  On-going   PT LONG TERM GOAL #5   Title she will be able to verbalize and demonstrate techniques to reduce risk or R shoulder reinjury via  lifting and carry mechanics, and HEP (06/17/15)   Time 8   Period Weeks   Status On-going               Plan - 07/15/15 1713    Clinical Impression Statement Mrs. Casillas presents to therapy today with report of increased pain the R shoulder which hasn't been there previously. No goals met this visit. She states that she hasn't been able to do anything with her arm since the pain has started. Todays session was limited due to increased pain in the shoulder. AROM and PROM are severly limited secondary to pain and tightness. MMT was not assessed due to pain. She reports that when she saw Dr. Erlinda Hong that he asked if she had felt a "pop" which she at the moment stated no, but today reports that she did have some popping that occurred at home after her last appointment with her surgeon. Advised to halt any strengthening exercises and begin wand exercises again to get the shoulder ROM back.    PT Next Visit Plan AAROM/PROM, modalities for pain,    PT Home Exercise Plan start back to wand exercises to get motion back.   Consulted and Agree with Plan of Care Patient        Problem List Patient Active Problem List   Diagnosis Date Noted  . Vitamin D deficiency 03/13/2015  . Urinary incontinence 02/25/2015  . Rotator cuff arthropathy 06/21/2014  . Health care maintenance 06/21/2014  . Hypertension 06/21/2014  . OSA (obstructive sleep apnea) 07/19/2013  . Obese 07/19/2013  . OSTEOARTHRITIS 12/03/2006  . Hypothyroidism 10/21/2006  . DEPRESSION 10/21/2006   Starr Lake PT, DPT, LAT, ATC  07/15/2015  5:32 PM    [facility] [address] [place], [place], [address] Phone: [phone]   Fax:  [phone]

## 2015-07-16 ENCOUNTER — Ambulatory Visit: Payer: No Typology Code available for payment source | Admitting: Physical Therapy

## 2015-07-16 ENCOUNTER — Telehealth: Payer: Self-pay | Admitting: Physical Therapy

## 2015-07-16 NOTE — Telephone Encounter (Signed)
Left message following up with her after her last visit due to her missing her visit today 07/16/2015. Also reminded her of her next visit on 07/23/2015 at 9:30.

## 2015-07-23 ENCOUNTER — Ambulatory Visit: Payer: No Typology Code available for payment source | Admitting: Physical Therapy

## 2015-07-23 DIAGNOSIS — R29898 Other symptoms and signs involving the musculoskeletal system: Secondary | ICD-10-CM

## 2015-07-23 DIAGNOSIS — M25511 Pain in right shoulder: Secondary | ICD-10-CM

## 2015-07-23 DIAGNOSIS — M25611 Stiffness of right shoulder, not elsewhere classified: Secondary | ICD-10-CM

## 2015-07-23 NOTE — Patient Instructions (Signed)
Make an appointment with MD.

## 2015-07-23 NOTE — Therapy (Signed)
[place], [place], [address] Phone: [phone]   Fax:  [phone]  Physical Therapy Treatment  Patient Details  Name: Shannon Newton MRN: [id number] Date of Birth: 10-23-1966 Referring Provider:  Burgess Estelle, MD  Encounter Date: 07/23/2015      PT End of Session - 07/23/15 1824    Visit Number 20   Number of Visits 25   Date for PT Re-Evaluation 07/24/15   PT Start Time 0940   PT Stop Time 1030   PT Time Calculation (min) 50 min   Activity Tolerance Patient limited by pain   Behavior During Therapy [facility] for tasks assessed/performed      Past Medical History  Diagnosis Date  . Depression   . Hypertension   . Abdominal pain, recurrent 2009    per CT of the abd/pelvis (01/26/2008) -  Diffuse inflammatory change of the descending and rectosigmoid colon.  Additional inflammatory changes within the terminal ileum raise concern for inflammatory bowel disease (Crohn's disease).  . Hypothyroidism 2003    Thyroid US done in 2003 (results in [place]) - showed increased 24 hour uptake and the findings were consistent with Grave's disease; patient is s/p RAI therapy (09/19/2002)  . Back pain, chronic 2001    per MRI in 2001 - disc protrusion L5-6, to the right  . Complication of anesthesia     hard to wake up-sob-possible sleep apnea  . Urinary frequency   . Sickle cell trait     Past Surgical History  Procedure Laterality Date  . Tubal ligation    . Cystocele repair  2004    done by Dr. Lowella Bandy  . Bladder suspension  2004  . Cystoscopy  2008    revision bladder sling  . Revision urinary sling  2006    sling replaced  . Shoulder arthroscopy with subacromial decompression Right 04/03/2015    Procedure: RIGHT SHOULDER ARTHROSCOPY WITH SUBACROMIAL DECOMPRESSION, DEBRIDEMENT ;  Surgeon: Leandrew Koyanagi, MD;  Location: [place];  Service: Orthopedics;  Laterality: Right;    There were no  vitals filed for this visit.  Visit Diagnosis:  Weakness of right arm  Pain in joint, shoulder region, right  Shoulder stiffness, right  Decreased ROM of right shoulder      Subjective Assessment - 07/23/15 0941    Subjective Sleepy from muscle relaxer  was 7/10 prior to pain med.   Currently in Pain? Yes   Pain Score 0-No pain  was 7/10 this am   Pain Descriptors / Indicators Constant   Pain Frequency Constant   Aggravating Factors  using arm, washing dishes   Pain Relieving Factors using dishwasher more.  Medication.   Multiple Pain Sites No                         [facility] Adult PT Treatment/Exercise - 07/23/15 0945    Shoulder Exercises: Supine   Other Supine Exercises AA    Other Supine Exercises cane not tolerated tearful just being in supine position.   Shoulder Exercises: Seated   External Rotation AROM;Both;10 reps;Other (comment)   External Rotation Limitations seated cane IR/ER  42 degrees, shoulder neutral   Other Seated Exercises table slides, flexion, elbow flex extension.   Other Seated Exercises grip squeezes 3 ways 10 reps each   Cryotherapy   Number Minutes Cryotherapy 10 Minutes   Cryotherapy Location Shoulder   Type of Cryotherapy --  cold pack   Electrical Stimulation   Electrical Stimulation Location Rt shouler  could not do due to transportation   Manual Therapy   Passive ROM --  attempted not tolerated   Kinesiotex --  2 fans shoulder to assist decreasing edema                  PT Short Term Goals - 07/15/15 1725    PT SHORT TERM GOAL #1   Title pt will be I with basic HEP 05/19/2015   Time 4   Period Weeks   Status Achieved   PT SHORT TERM GOAL #2   Title pt will decrease pain to <5/10 during PROM/AAROM to assist with exercise progression 05/19/2015   Baseline 9/10 during movement   Time 4   Period Weeks   Status Achieved   PT SHORT TERM GOAL #3   Title pt will Increase R shoulder PROM flexion and abduction by  >20 degrees to asisst with ADLs 05/19/2015   Baseline full   Time 4   Period Weeks   Status Achieved   PT SHORT TERM GOAL #4   Title She will be able to verbalize and demonstrate techniques to control R shoulder inflammation via RICE method 05/19/2015   Baseline Uses heat at home , understands use of RICE   Time 4   Period Weeks   Status New   PT SHORT TERM GOAL #5   Title She will increase her FOTO score by > 10 points to help with functional capacity 05/19/2015   Time 4   Period Weeks   Status Achieved           PT Long Term Goals - 07/15/15 1725    PT LONG TERM GOAL #1   Title She will be I with advanced HEP (06/17/15)   Time 8   Period Weeks   Status On-going   PT LONG TERM GOAL #2   Title She will demonstrate < 3/10 pain during and following AROM of the R shoulder to assist with ADLs (06/17/15)   Time 8   Period Weeks   Status Achieved   PT LONG TERM GOAL #3   Title She will demonstrate functional R shoulder AROM compared bil to assist with personal hygiene and ADLs (06/17/15)   Time 8   Period Weeks   Status On-going   PT LONG TERM GOAL #4   Title She will increase R grip strength to > 60 # to help with funcitonal exercise progression (06/17/15)   Baseline 57 LBS RT average. improved from 34 LBS 1 week ago   Time 8   Period Weeks   Status On-going   PT LONG TERM GOAL #5   Title she will be able to verbalize and demonstrate techniques to reduce risk or R shoulder reinjury via lifting and carry mechanics, and HEP (06/17/15)   Time 8   Period Weeks   Status On-going               Plan - 07/23/15 1825    Clinical Impression Statement Very painful session.  Patient could not tolerate supine position for very long.  PT agreed patient is not tolerating PT .  Hopefully patient will make an appointment with him.  No New goals met.   PT Next Visit Plan See what MD says   Consulted and Agree with Plan of Care Patient        Problem List Patient Active Problem List    Diagnosis  Date Noted  . Vitamin D deficiency 03/13/2015  . Urinary incontinence 02/25/2015  . Rotator cuff arthropathy 06/21/2014  . Health care maintenance 06/21/2014  . Hypertension 06/21/2014  . OSA (obstructive sleep apnea) 07/19/2013  . Obese 07/19/2013  . OSTEOARTHRITIS 12/03/2006  . Hypothyroidism 10/21/2006  . DEPRESSION 10/21/2006    HARRIS,KAREN 07/23/2015, 6:31 PM  [facility] [address] [place], [place], [address] Phone: [phone]   Fax:  [phone]     Melvenia Needles, PTA 07/23/2015 6:31 PM Phone: [phone] Fax: [phone]

## 2015-07-25 ENCOUNTER — Ambulatory Visit: Payer: No Typology Code available for payment source | Admitting: Physical Therapy

## 2015-07-25 DIAGNOSIS — M25511 Pain in right shoulder: Secondary | ICD-10-CM

## 2015-07-25 DIAGNOSIS — R29898 Other symptoms and signs involving the musculoskeletal system: Secondary | ICD-10-CM

## 2015-07-25 DIAGNOSIS — M25611 Stiffness of right shoulder, not elsewhere classified: Secondary | ICD-10-CM

## 2015-07-25 NOTE — Patient Instructions (Signed)
Call MD

## 2015-07-25 NOTE — Therapy (Signed)
[facility] [address] [place], [place], [address] Phone: [phone]   Fax:  [phone]  Physical Therapy Treatment  Patient Details  Name: Shannon Newton MRN: [id number] Date of Birth: 17-Sep-1966 Referring Provider:  Deneise Lever, MD  Encounter Date: 07/25/2015      PT End of Session - 07/25/15 1023    Visit Number 21   Number of Visits 25   PT Start Time 0930   PT Stop Time 1025   PT Time Calculation (min) 55 min   Activity Tolerance Patient tolerated treatment well;Patient limited by pain   Behavior During Therapy [facility] for tasks assessed/performed      Past Medical History  Diagnosis Date  . Depression   . Hypertension   . Abdominal pain, recurrent 2009    per CT of the abd/pelvis (01/26/2008) -  Diffuse inflammatory change of the descending and rectosigmoid colon.  Additional inflammatory changes within the terminal ileum raise concern for inflammatory bowel disease (Crohn's disease).  . Hypothyroidism 2003    Thyroid US done in 2003 (results in [place]) - showed increased 24 hour uptake and the findings were consistent with Grave's disease; patient is s/p RAI therapy (09/19/2002)  . Back pain, chronic 2001    per MRI in 2001 - disc protrusion L5-6, to the right  . Complication of anesthesia     hard to wake up-sob-possible sleep apnea  . Urinary frequency   . Sickle cell trait     Past Surgical History  Procedure Laterality Date  . Tubal ligation    . Cystocele repair  2004    done by Dr. Su Grand  . Bladder suspension  2004  . Cystoscopy  2008    revision bladder sling  . Revision urinary sling  2006    sling replaced  . Shoulder arthroscopy with subacromial decompression Right 04/03/2015    Procedure: RIGHT SHOULDER ARTHROSCOPY WITH SUBACROMIAL DECOMPRESSION, DEBRIDEMENT ;  Surgeon: Tarry Kos, MD;  [facility];  Service: Orthopedics;  Laterality: Right;    There were no  vitals filed for this visit.  Visit Diagnosis:  Pain in joint, shoulder region, right  Weakness of right arm  Shoulder stiffness, right  Decreased ROM of right shoulder      Subjective Assessment - 07/25/15 0939    Subjective No pain, I was in the shower and it helped.  No pain meds  because they made her too sleepy.  Has not called the MD.  She will call MD Today.  She does not want me to call.  She wanted to see how she did today prior to calling MD.   Currently in Pain? No/denies   Pain Score --  4-5 yesterday with pain MEDS   Pain Location Shoulder   Pain Orientation Posterior;Medial;Distal                         [facility] Adult PT Treatment/Exercise - 07/25/15 0944    Hand Exercises   Other Hand Exercises grip 30,30 ,31 LBS rt,  decreased from 7/18 /2016   Moist Heat Therapy   Number Minutes Moist Heat 15 Minutes   Moist Heat Location Shoulder   Electrical Stimulation   Electrical Stimulation Location Rt shoulder during session to help eas pain during ROM   Electrical Stimulation Action IFC   Electrical Stimulation Parameters 9   Electrical Stimulation Goals Pain   Manual Therapy   Passive ROM all  planes,  120 degrees.                  PT Short Term Goals - 07/15/15 1725    PT SHORT TERM GOAL #1   Title pt will be I with basic HEP 05/19/2015   Time 4   Period Weeks   Status Achieved   PT SHORT TERM GOAL #2   Title pt will decrease pain to <5/10 during PROM/AAROM to assist with exercise progression 05/19/2015   Baseline 9/10 during movement   Time 4   Period Weeks   Status Achieved   PT SHORT TERM GOAL #3   Title pt will Increase R shoulder PROM flexion and abduction by >20 degrees to asisst with ADLs 05/19/2015   Baseline full   Time 4   Period Weeks   Status Achieved   PT SHORT TERM GOAL #4   Title She will be able to verbalize and demonstrate techniques to control R shoulder inflammation via RICE method 05/19/2015   Baseline Uses heat at  home , understands use of RICE   Time 4   Period Weeks   Status New   PT SHORT TERM GOAL #5   Title She will increase her FOTO score by > 10 points to help with functional capacity 05/19/2015   Time 4   Period Weeks   Status Achieved           PT Long Term Goals - 07/25/15 1120    PT LONG TERM GOAL #1   Title She will be I with advanced HEP (06/17/15)   Time 8   Period Weeks   Status On-going   PT LONG TERM GOAL #2   Title She will demonstrate < 3/10 pain during and following AROM of the R shoulder to assist with ADLs (06/17/15)   Baseline unable to tolerate AROM.   Time 8   Period Weeks   Status On-going   PT LONG TERM GOAL #3   Title She will demonstrate functional R shoulder AROM compared bil to assist with personal hygiene and ADLs (06/17/15)   Baseline shoulder not functional since injury   Time 8   Period Weeks   Status On-going   PT LONG TERM GOAL #4   Title She will increase R grip strength to > 60 # to help with funcitonal exercise progression (06/17/15)   Baseline see flow sheet.  Grip is 20 LBS less than 2 weeks ago   Time 8   Period Weeks   Status On-going   PT LONG TERM GOAL #5   Title she will be able to verbalize and demonstrate techniques to reduce risk or R shoulder reinjury via lifting and carry mechanics, and HEP (06/17/15)   Time 8   Period Weeks   Status On-going               Plan - 07/25/15 1024    PT Next Visit Plan see what MD says.  Exercise as tolerated.   Consulted and Agree with Plan of Care Patient        Problem List Patient Active Problem List   Diagnosis Date Noted  . Vitamin D deficiency 03/13/2015  . Urinary incontinence 02/25/2015  . Rotator cuff arthropathy 06/21/2014  . Health care maintenance 06/21/2014  . Hypertension 06/21/2014  . OSA (obstructive sleep apnea) 07/19/2013  . Obese 07/19/2013  . OSTEOARTHRITIS 12/03/2006  . Hypothyroidism 10/21/2006  . DEPRESSION 10/21/2006    Oskar Cretella 07/25/2015, 12:22  PM  [facility] [address] [place], [place], [address] Phone: [phone]   Fax:  [phone]     Liz Beach, PTA 07/25/2015 12:22 PM Phone: [phone] Fax: [phone]

## 2015-07-29 ENCOUNTER — Ambulatory Visit: Payer: No Typology Code available for payment source | Admitting: Physical Therapy

## 2015-07-31 ENCOUNTER — Telehealth: Payer: Self-pay | Admitting: Physical Therapy

## 2015-07-31 ENCOUNTER — Ambulatory Visit: Payer: No Typology Code available for payment source | Admitting: Physical Therapy

## 2015-07-31 NOTE — Telephone Encounter (Signed)
Spoke with pt regarding her missing the last 2 visits. She reported she did see Dr. Roda Shutters and that he x-rayed her shoulder and gave her an injection and it is feeling better but to monitor it for pain or problems. Reminded pt that has another visit on Monday the 2nd at 9:30.  Pt reported she just forgot about the last 2 visits due her mom requiring surgery but she will be here for her next visit.

## 2015-08-05 ENCOUNTER — Ambulatory Visit: Payer: No Typology Code available for payment source | Admitting: Physical Therapy

## 2015-08-05 DIAGNOSIS — M25611 Stiffness of right shoulder, not elsewhere classified: Secondary | ICD-10-CM

## 2015-08-05 DIAGNOSIS — R29898 Other symptoms and signs involving the musculoskeletal system: Secondary | ICD-10-CM

## 2015-08-05 DIAGNOSIS — M25511 Pain in right shoulder: Secondary | ICD-10-CM

## 2015-08-05 NOTE — Therapy (Addendum)
[place], [place], [address] Phone: [phone]   Fax:  [phone]  Physical Therapy Treatment  Patient Details  Name: Shannon Newton MRN: [id number] Date of Birth: [birth year] Referring Provider:  Burgess Estelle, MD  Encounter Date: 08/05/2015      PT End of Session - 08/05/15 1113    Visit Number 22   Number of Visits 25   Date for PT Re-Evaluation 07/24/15   PT Start Time 0950   PT Stop Time 1050   PT Time Calculation (min) 60 min   Activity Tolerance Patient tolerated treatment well;No increased pain   Behavior During Therapy [facility] for tasks assessed/performed      Past Medical History  Diagnosis Date  . Depression   . Hypertension   . Abdominal pain, recurrent 2009    per CT of the abd/pelvis (01/26/2008) -  Diffuse inflammatory change of the descending and rectosigmoid colon.  Additional inflammatory changes within the terminal ileum raise concern for inflammatory bowel disease (Crohn's disease).  . Hypothyroidism 2003    Thyroid US done in 2003 (results in [place]) - showed increased 24 hour uptake and the findings were consistent with Grave's disease; patient is s/p RAI therapy (09/19/2002)  . Back pain, chronic 2001    per MRI in 2001 - disc protrusion L5-6, to the right  . Complication of anesthesia     hard to wake up-sob-possible sleep apnea  . Urinary frequency   . Sickle cell trait     Past Surgical History  Procedure Laterality Date  . Tubal ligation    . Cystocele repair  2004    done by Dr. Lowella Bandy  . Bladder suspension  2004  . Cystoscopy  2008    revision bladder sling  . Revision urinary sling  2006    sling replaced  . Shoulder arthroscopy with subacromial decompression Right 04/03/2015    Procedure: RIGHT SHOULDER ARTHROSCOPY WITH SUBACROMIAL DECOMPRESSION, DEBRIDEMENT ;  Surgeon: Leandrew Koyanagi, MD;  Location: [place];  Service: Orthopedics;   Laterality: Right;    There were no vitals filed for this visit.  Visit Diagnosis:  Weakness of right arm  Shoulder stiffness, right  Decreased ROM of right shoulder  Pain in joint, shoulder region, right      Subjective Assessment - 08/05/15 0956    Subjective Shot wore off.  Wants to make this the last day.  Thins are not better Pain is constant.   Currently in Pain? Yes   Pain Score 3   Up tp 9/10   Pain Location Shoulder   Pain Orientation Right   Pain Descriptors / Indicators Aching;Throbbing;Stabbing;Sharp   Pain Radiating Towards to elbow   Pain Frequency Constant   Aggravating Factors  at rest and with using   Pain Relieving Factors hot shower   Effect of Pain on Daily Activities sleeps only 1 hour then it wakes    Multiple Pain Sites No                         [facility] Adult PT Treatment/Exercise - 08/05/15 1000    Self-Care   Other Self-Care Comments  trigger area teres at corner at doorway with padding.   Elbow Exercises   Other elbow exercises AAROM  140 flexion,   ABDuction 115 degrees ,  ER Neutral,  56 degrees   Shoulder Exercises: Seated   Other Seated Exercises isometrics added to  home program.  10 reps 5 seconds.   Shoulder Exercises: Standing   Retraction 10 reps   Other Standing Exercises isometrics added 10 reps 5 second holds, 10 X   Shoulder Exercises: ROM/Strengthening   Other ROM/Strengthening Exercises Extension 56 prom   Hand Exercises   Other Hand Exercises RT 42, 39, 50, LT 60, 66, 59 LBS   Moist Heat Therapy   Number Minutes Moist Heat 15 Minutes   Moist Heat Location Shoulder  aching reported superior so moved to teres area.     Manual Therapy   Passive ROM all planes RT                PT Education - 08/05/15 1107    Education provided Yes   Education Details Isometrics   Person(s) Educated Patient   Methods Explanation;Demonstration;Tactile cues;Verbal cues;Handout   Comprehension Verbalized  understanding;Returned demonstration          PT Short Term Goals - 08/05/15 1816    PT SHORT TERM GOAL #1   Title pt will be I with basic HEP 05/19/2015   Time 4   Period Weeks   Status Achieved   PT SHORT TERM GOAL #2   Title pt will decrease pain to <5/10 during PROM/AAROM to assist with exercise progression 05/19/2015   Time 4   Period Weeks   Status Achieved   PT SHORT TERM GOAL #3   Title pt will Increase R shoulder PROM flexion and abduction by >20 degrees to asisst with ADLs 05/19/2015   Time 4   Period Weeks   Status Achieved   PT SHORT TERM GOAL #4   Title She will be able to verbalize and demonstrate techniques to control R shoulder inflammation via RICE method 05/19/2015   Baseline understands.   Time 4   Period Weeks   Status Achieved   PT SHORT TERM GOAL #5   Title She will increase her FOTO score by > 10 points to help with functional capacity 05/19/2015   Baseline 57% limitation improved from 81% on intake   Time 4   Period Weeks   Status Achieved           PT Long Term Goals - 08/05/15 1818    PT LONG TERM GOAL #1   Title She will be I with advanced HEP (06/17/15)   Baseline independent (final vs advanced)   Time 8   Period Weeks   Status Achieved   PT LONG TERM GOAL #2   Title She will demonstrate < 3/10 pain during and following AROM of the R shoulder to assist with ADLs (06/17/15)   Baseline mild   Time 8   Period Weeks   Status Achieved   PT LONG TERM GOAL #3   Title She will demonstrate functional R shoulder AROM compared bil to assist with personal hygiene and ADLs (06/17/15)   Baseline Not back to baseline   Time 8   Period Weeks   Status Not Met   PT LONG TERM GOAL #4   Title She will increase R grip strength to > 60 # to help with funcitonal exercise progression (06/17/15)   Baseline 43.6 average RT grip   Time 8   Period Weeks   Status Not Met   PT LONG TERM GOAL #5   Title she will be able to verbalize and demonstrate techniques to reduce  risk or R shoulder reinjury via lifting and carry mechanics, and HEP (06/17/15)   Baseline able  Time 8   Period Weeks   Status Achieved               Plan - 08/05/15 1113    Clinical Impression Statement Pationt frustrated with her shoulder.  She wants this to be the last session.  She has been able to work her exercises at home.  PT consulted about patient's request for         Problem List Patient Active Problem List   Diagnosis Date Noted  . Vitamin D deficiency 03/13/2015  . Urinary incontinence 02/25/2015  . Rotator cuff arthropathy 06/21/2014  . Health care maintenance 06/21/2014  . Hypertension 06/21/2014  . OSA (obstructive sleep apnea) 07/19/2013  . Obese 07/19/2013  . OSTEOARTHRITIS 12/03/2006  . Hypothyroidism 10/21/2006  . DEPRESSION 10/21/2006    Aliviya Schoeller 08/05/2015, 6:23 PM  [facility] [address] [place], [place], [address] Phone: [phone]   Fax:  [phone]     Melvenia Needles, PTA 08/05/2015 6:23 PM Phone: [phone] Fax: [phone]             PHYSICAL THERAPY DISCHARGE SUMMARY  Visits from Start of Care: 22  Current functional level related to goals / functional outcomes: FOTO 57% limited   Remaining deficits: R shoulder pain, weakness, limited AROM   Education / Equipment: HEP, theraband for exercises.  Plan: Patient agrees to discharge.  Patient goals were partially met. Patient is being discharged due to lack of progress.  ?????        Kristoffer Leamon PT, DPT, LAT, ATC  08/06/2015  6:25 PM

## 2015-08-05 NOTE — Patient Instructions (Signed)
External Rotation (Isometric)   With elbow bent and held at side, use other hand to apply resistance to outward motion of arm. Hold 5-10____ seconds. Repeat _10___ times. Do __1-2__ sessions per day.  Copyright  VHI. All rights reserved.  Extension (Isometric)   Press elbow into the padded back of seat. Hold _5-10___ seconds. Relax. Repeat _10___ times. Do 1-2____ sessions per day.  Copyright  VHI. All rights reserved.  SHOULDER: Abduction (Isometric) A  Use wall as resistance. Press arm against pillow. Keep elbow straight. Hold _5-10__ seconds. __10_ reps per set, _1__ sets per day, 5___ days per week     ALSO   Face wall and press arm into wall. Wall will stop the forward motion.  10 reps 5 to 10 second.  Copyright  VHI. All rights reserved.

## 2015-08-07 ENCOUNTER — Ambulatory Visit: Payer: No Typology Code available for payment source | Admitting: Physical Therapy

## 2015-08-07 DIAGNOSIS — R29898 Other symptoms and signs involving the musculoskeletal system: Secondary | ICD-10-CM

## 2015-08-07 DIAGNOSIS — M25511 Pain in right shoulder: Secondary | ICD-10-CM

## 2015-08-07 DIAGNOSIS — M25611 Stiffness of right shoulder, not elsewhere classified: Secondary | ICD-10-CM

## 2015-08-07 NOTE — Therapy (Signed)
[place], [place], [address] Phone: [phone]   Fax:  [phone]  Physical Therapy Treatment  Patient Details  Name: Shannon Newton MRN: [id number] Date of Birth: 10-16-1966 Referring Provider:  Burgess Estelle, MD  Encounter Date: 08/07/2015      PT End of Session - 08/07/15 1133    Visit Number 23   Number of Visits 25   Date for PT Re-Evaluation 07/24/15   PT Start Time 0930   PT Stop Time 0955   PT Time Calculation (min) 25 min   Activity Tolerance Patient tolerated treatment well   Behavior During Therapy [facility] for tasks assessed/performed      Past Medical History  Diagnosis Date  . Depression   . Hypertension   . Abdominal pain, recurrent 2009    per CT of the abd/pelvis (01/26/2008) -  Diffuse inflammatory change of the descending and rectosigmoid colon.  Additional inflammatory changes within the terminal ileum raise concern for inflammatory bowel disease (Crohn's disease).  . Hypothyroidism 2003    Thyroid US done in 2003 (results in [place]) - showed increased 24 hour uptake and the findings were consistent with Grave's disease; patient is s/p RAI therapy (09/19/2002)  . Back pain, chronic 2001    per MRI in 2001 - disc protrusion L5-6, to the right  . Complication of anesthesia     hard to wake up-sob-possible sleep apnea  . Urinary frequency   . Sickle cell trait     Past Surgical History  Procedure Laterality Date  . Tubal ligation    . Cystocele repair  2004    done by Dr. Lowella Bandy  . Bladder suspension  2004  . Cystoscopy  2008    revision bladder sling  . Revision urinary sling  2006    sling replaced  . Shoulder arthroscopy with subacromial decompression Right 04/03/2015    Procedure: RIGHT SHOULDER ARTHROSCOPY WITH SUBACROMIAL DECOMPRESSION, DEBRIDEMENT ;  Surgeon: Leandrew Koyanagi, MD;  Location: [place];  Service: Orthopedics;  Laterality: Right;     There were no vitals filed for this visit.  Visit Diagnosis:  Weakness of right arm  Shoulder stiffness, right  Decreased ROM of right shoulder  Pain in joint, shoulder region, right                                 PT Short Term Goals - 08/05/15 1816    PT SHORT TERM GOAL #1   Title pt will be I with basic HEP 05/19/2015   Time 4   Period Weeks   Status Achieved   PT SHORT TERM GOAL #2   Title pt will decrease pain to <5/10 during PROM/AAROM to assist with exercise progression 05/19/2015   Time 4   Period Weeks   Status Achieved   PT SHORT TERM GOAL #3   Title pt will Increase R shoulder PROM flexion and abduction by >20 degrees to asisst with ADLs 05/19/2015   Time 4   Period Weeks   Status Achieved   PT SHORT TERM GOAL #4   Title She will be able to verbalize and demonstrate techniques to control R shoulder inflammation via RICE method 05/19/2015   Baseline understands.   Time 4   Period Weeks   Status Achieved   PT SHORT TERM GOAL #5   Title She will increase her FOTO score by >  10 points to help with functional capacity 05/19/2015   Baseline 57% limitation improved from 81% on intake   Time 4   Period Weeks   Status Achieved           PT Long Term Goals - 08/05/15 1818    PT LONG TERM GOAL #1   Title She will be I with advanced HEP (06/17/15)   Baseline independent (final vs advanced)   Time 8   Period Weeks   Status Achieved   PT LONG TERM GOAL #2   Title She will demonstrate < 3/10 pain during and following AROM of the R shoulder to assist with ADLs (06/17/15)   Baseline mild   Time 8   Period Weeks   Status Achieved   PT LONG TERM GOAL #3   Title She will demonstrate functional R shoulder AROM compared bil to assist with personal hygiene and ADLs (06/17/15)   Baseline Not back to baseline   Time 8   Period Weeks   Status Not Met   PT LONG TERM GOAL #4   Title She will increase R grip strength to > 60 # to help with funcitonal  exercise progression (06/17/15)   Baseline 43.6 average RT grip   Time 8   Period Weeks   Status Not Met   PT LONG TERM GOAL #5   Title she will be able to verbalize and demonstrate techniques to reduce risk or R shoulder reinjury via lifting and carry mechanics, and HEP (06/17/15)   Baseline able   Time 8   Period Weeks   Status Achieved               Plan - 08/07/15 1134    Clinical Impression Statement Hamna presents to her appointment today after discussing in her previous visit that she wanted to be done with therapy stating she hasn't made any progress, and wanted to be discharged.  answered pt's questions regarding anatomy and what was done in her surgery per the surgery note, pt reports it felt like it did prior to the surgery and that she now avoids not using her arm due to the pain.  She demonstrated uncertainity in what she wanted to do in regard to going back to her referring physician, getting a second opinion or just living with and dealing with her shoulder.  Educated that while she figures out what she wants to do, to continue moving the shoulder to decrease futher complications, and if she had any questions that she could call  and we would work to answer any potential questions she had and pt agreed.         Problem List Patient Active Problem List   Diagnosis Date Noted  . Vitamin D deficiency 03/13/2015  . Urinary incontinence 02/25/2015  . Rotator cuff arthropathy 06/21/2014  . Health care maintenance 06/21/2014  . Hypertension 06/21/2014  . OSA (obstructive sleep apnea) 07/19/2013  . Obese 07/19/2013  . OSTEOARTHRITIS 12/03/2006  . Hypothyroidism 10/21/2006  . DEPRESSION 10/21/2006    [place] 08/07/2015, 11:44 AM  [facility] [address] [place], [place], [address] Phone: [phone]   Fax:  [phone]

## 2015-08-21 ENCOUNTER — Encounter: Payer: No Typology Code available for payment source | Admitting: Physical Therapy

## 2015-08-26 ENCOUNTER — Encounter: Payer: Self-pay | Admitting: Internal Medicine

## 2015-08-26 ENCOUNTER — Ambulatory Visit (INDEPENDENT_AMBULATORY_CARE_PROVIDER_SITE_OTHER): Payer: No Typology Code available for payment source | Admitting: Internal Medicine

## 2015-08-26 VITALS — BP 197/89 | HR 87 | Temp 97.7°F | Ht 61.0 in | Wt 217.4 lb

## 2015-08-26 DIAGNOSIS — R32 Unspecified urinary incontinence: Secondary | ICD-10-CM

## 2015-08-26 DIAGNOSIS — E559 Vitamin D deficiency, unspecified: Secondary | ICD-10-CM

## 2015-08-26 DIAGNOSIS — M12511 Traumatic arthropathy, right shoulder: Secondary | ICD-10-CM

## 2015-08-26 DIAGNOSIS — Z713 Dietary counseling and surveillance: Secondary | ICD-10-CM

## 2015-08-26 DIAGNOSIS — Z0000 Encounter for general adult medical examination without abnormal findings: Secondary | ICD-10-CM

## 2015-08-26 DIAGNOSIS — E669 Obesity, unspecified: Secondary | ICD-10-CM

## 2015-08-26 DIAGNOSIS — G4700 Insomnia, unspecified: Secondary | ICD-10-CM

## 2015-08-26 DIAGNOSIS — E039 Hypothyroidism, unspecified: Secondary | ICD-10-CM

## 2015-08-26 DIAGNOSIS — Z9889 Other specified postprocedural states: Secondary | ICD-10-CM

## 2015-08-26 DIAGNOSIS — Z6841 Body Mass Index (BMI) 40.0 and over, adult: Secondary | ICD-10-CM

## 2015-08-26 DIAGNOSIS — Z79899 Other long term (current) drug therapy: Secondary | ICD-10-CM

## 2015-08-26 DIAGNOSIS — I10 Essential (primary) hypertension: Secondary | ICD-10-CM

## 2015-08-26 DIAGNOSIS — M12811 Other specific arthropathies, not elsewhere classified, right shoulder: Secondary | ICD-10-CM

## 2015-08-26 DIAGNOSIS — E785 Hyperlipidemia, unspecified: Secondary | ICD-10-CM | POA: Insufficient documentation

## 2015-08-26 MED ORDER — DICLOFENAC SODIUM 1 % TD GEL: 2.0000 g | Freq: Four times a day (QID) | TRANSDERMAL | Status: DC

## 2015-08-26 MED ORDER — AMLODIPINE BESYLATE 10 MG PO TABS: 10.0000 mg | ORAL_TABLET | Freq: Every day | ORAL | Status: DC

## 2015-08-26 MED ORDER — LEVOTHYROXINE SODIUM 88 MCG PO TABS: 88.0000 ug | ORAL_TABLET | Freq: Every day | ORAL | Status: DC

## 2015-08-26 NOTE — Progress Notes (Signed)
Patient ID: Shannon Newton, female   DOB: 1966-10-20, 49 y.o.   MRN: [id number]   Subjective:   Patient ID: Shannon Newton female   DOB: 11/19/1966 49 y.o.   MRN: [id number]  HPI: [name] is a 49 y.o. woman with notable PMH of HTn, rotator cuff tear s/p repair, vitamin D deficiency, hypothyroidism, HLD, who is here for a 3 month follow up visit. Other PMH listed below.    Past Medical History  Diagnosis Date  . Depression   . Hypertension   . Abdominal pain, recurrent 2009    per CT of the abd/pelvis (01/26/2008) -  Diffuse inflammatory change of the descending and rectosigmoid colon.  Additional inflammatory changes within the terminal ileum raise concern for inflammatory bowel disease (Crohn's disease).  . Hypothyroidism 2003    Thyroid US done in 2003 (results in [place]) - showed increased 24 hour uptake and the findings were consistent with Grave's disease; patient is s/p RAI therapy (09/19/2002)  . Back pain, chronic 2001    per MRI in 2001 - disc protrusion L5-6, to the right  . Complication of anesthesia     hard to wake up-sob-possible sleep apnea  . Urinary frequency   . Sickle cell trait    Current Outpatient Prescriptions  Medication Sig Dispense Refill  . Cholecalciferol (VITAMIN D3) 400 UNITS tablet Take 2 tablets (800 Units total) by mouth daily. 60 tablet 11  . oxybutynin (DITROPAN-XL) 10 MG 24 hr tablet Take 10 mg by mouth at bedtime.    Marland Kitchen amLODipine (NORVASC) 10 MG tablet Take 1 tablet (10 mg total) by mouth daily. 30 tablet 5  . Camphor-Menthol-Methyl Sal 3.12-29-08 % GEL Apply to affected area three times a day as needed. (Patient not taking: Reported on 08/26/2015) 56.6 g 2  . diclofenac sodium (VOLTAREN) 1 % GEL Apply 2 g topically 4 (four) times daily. 5 Tube 5  . levothyroxine (SYNTHROID, LEVOTHROID) 88 MCG tablet Take 1 tablet (88 mcg total) by mouth daily before breakfast. 30 tablet 3  . senna-docusate (SENOKOT S) 8.6-50 MG per tablet Take 1 tablet by  mouth at bedtime as needed. (Patient not taking: Reported on 08/26/2015) 30 tablet 1  . traMADol (ULTRAM) 50 MG tablet   0   No current facility-administered medications for this visit.   Family History  Problem Relation Age of Onset  . Stroke Neg Hx   . Cancer Neg Hx    Social History   Social History  . Marital Status: Single    Spouse Name: N/A  . Number of Children: N/A  . Years of Education: N/A   Social History Main Topics  . Smoking status: Never Smoker   . Smokeless tobacco: Never Used  . Alcohol Use: No  . Drug Use: No  . Sexual Activity: Yes   Other Topics Concern  . None   Social History Narrative   Review of Systems: Review of Systems  Constitutional: Positive for malaise/fatigue.  Eyes: Negative.   Respiratory: Negative.  Negative for shortness of breath.   Cardiovascular: Negative for chest pain, palpitations, orthopnea and leg swelling.  Gastrointestinal: Negative.   Genitourinary:       Stress and urge incontinence  Musculoskeletal: Positive for joint pain.  Neurological: Negative.   Endo/Heme/Allergies: Negative.   Psychiatric/Behavioral: Negative for depression. The patient is nervous/anxious and has insomnia.     Objective:  Physical Exam: Filed Vitals:   08/26/15 1341  BP: 197/89  Pulse: 87  Temp: 97.7  F (36.5 C)  TempSrc: Oral  Height: 5\' 1"  (1.549 m)  Weight: 217 lb 6.4 oz (98.612 kg)  SpO2: 100%   Physical Exam  Constitutional: She is oriented to person, place, and time. She appears well-developed.  obese  HENT:  Head: Normocephalic and atraumatic.  Eyes: EOM are normal. Pupils are equal, round, and reactive to light.  Neck: Normal range of motion. No JVD present. No thyromegaly present.  Cardiovascular: Normal rate, regular rhythm, normal heart sounds and intact distal pulses.  Exam reveals no gallop and no friction rub.   No murmur heard. Pulmonary/Chest: Effort normal and breath sounds normal. No respiratory distress. She  has no wheezes.  Abdominal: Soft. Bowel sounds are normal. She exhibits no distension.  Genitourinary: Vagina normal. No vaginal discharge found.  Musculoskeletal:  Limited ROm of right shoulder  Lymphadenopathy:    She has no cervical adenopathy.  Neurological: She is alert and oriented to person, place, and time. She has normal reflexes.  Skin: Skin is warm.  Psychiatric: She has a normal mood and affect.    Assessment & Plan:   Case discussed with Dr. Dalphine Handing Please see problem based charting for assessment and plan We spent >45 minutes with this patient in counseling and direct patient care.

## 2015-08-26 NOTE — Patient Instructions (Signed)
Thank you for your visit today.  Please restart synthroid 88 mcg as prescribed for your thyroid Please restart amlodipine 10 mg for your blood pressure I have referred to urology, and also to Ms. Lupita Leash for nutrition Please follow up in 6 weeks, please bring the FOBT cards I will call you to inform you of your pap smear results if it is concerning, and your cholesterol level, and to start the statin medicine if needed, and also your vitamin D level  Hypothyroidism The thyroid is a large gland located in the lower front of your neck. The thyroid gland helps control metabolism. Metabolism is how your body handles food. It controls metabolism with the hormone thyroxine. When this gland is underactive (hypothyroid), it produces too little hormone.  CAUSES These include:   Absence or destruction of thyroid tissue.  Goiter due to iodine deficiency.  Goiter due to medications.  Congenital defects (since birth).  Problems with the pituitary. This causes a lack of TSH (thyroid stimulating hormone). This hormone tells the thyroid to turn out more hormone. SYMPTOMS  Lethargy (feeling as though you have no energy)  Cold intolerance  Weight gain (in spite of normal food intake)  Dry skin  Coarse hair  Menstrual irregularity (if severe, may lead to infertility)  Slowing of thought processes Cardiac problems are also caused by insufficient amounts of thyroid hormone. Hypothyroidism in the newborn is cretinism, and is an extreme form. It is important that this form be treated adequately and immediately or it will lead rapidly to retarded physical and mental development. DIAGNOSIS  To prove hypothyroidism, your caregiver may do blood tests and ultrasound tests. Sometimes the signs are hidden. It may be necessary for your caregiver to watch this illness with blood tests either before or after diagnosis and treatment. TREATMENT  Low levels of thyroid hormone are increased by using synthetic  thyroid hormone. This is a safe, effective treatment. It usually takes about four weeks to gain the full effects of the medication. After you have the full effect of the medication, it will generally take another four weeks for problems to leave. Your caregiver may start you on low doses. If you have had heart problems the dose may be gradually increased. It is generally not an emergency to get rapidly to normal. HOME CARE INSTRUCTIONS   Take your medications as your caregiver suggests. Let your caregiver know of any medications you are taking or start taking. Your caregiver will help you with dosage schedules.  As your condition improves, your dosage needs may increase. It will be necessary to have continuing blood tests as suggested by your caregiver.  Report all suspected medication side effects to your caregiver. SEEK MEDICAL CARE IF: Seek medical care if you develop:  Sweating.  Tremulousness (tremors).  Anxiety.  Rapid weight loss.  Heat intolerance.  Emotional swings.  Diarrhea.  Weakness. SEEK IMMEDIATE MEDICAL CARE IF:  You develop chest pain, an irregular heart beat (palpitations), or a rapid heart beat. MAKE SURE YOU:   Understand these instructions.  Will watch your condition.  Will get help right away if you are not doing well or get worse. Document Released: 11/30/2005 Document Revised: 02/22/2012 Document Reviewed: 07/20/2008 [facility] Patient Information 2015 [place], [place]. This information is not intended to replace advice given to you by your health care provider. Make sure you discuss any questions you have with your health care provider. Hypertension Hypertension, commonly called high blood pressure, is when the force of blood pumping through your  arteries is too strong. Your arteries are the blood vessels that carry blood from your heart throughout your body. A blood pressure reading consists of a higher number over a lower number, such as 110/72. The  higher number (systolic) is the pressure inside your arteries when your heart pumps. The lower number (diastolic) is the pressure inside your arteries when your heart relaxes. Ideally you want your blood pressure below 120/80. Hypertension forces your heart to work harder to pump blood. Your arteries may become narrow or stiff. Having hypertension puts you at risk for heart disease, stroke, and other problems.  RISK FACTORS Some risk factors for high blood pressure are controllable. Others are not.  Risk factors you cannot control include:   Race. You may be at higher risk if you are African American.  Age. Risk increases with age.  Gender. Men are at higher risk than women before age 61 years. After age 79, women are at higher risk than men. Risk factors you can control include:  Not getting enough exercise or physical activity.  Being overweight.  Getting too much fat, sugar, calories, or salt in your diet.  Drinking too much alcohol. SIGNS AND SYMPTOMS Hypertension does not usually cause signs or symptoms. Extremely high blood pressure (hypertensive crisis) may cause headache, anxiety, shortness of breath, and nosebleed. DIAGNOSIS  To check if you have hypertension, your health care provider will measure your blood pressure while you are seated, with your arm held at the level of your heart. It should be measured at least twice using the same arm. Certain conditions can cause a difference in blood pressure between your right and left arms. A blood pressure reading that is higher than normal on one occasion does not mean that you need treatment. If one blood pressure reading is high, ask your health care provider about having it checked again. TREATMENT  Treating high blood pressure includes making lifestyle changes and possibly taking medicine. Living a healthy lifestyle can help lower high blood pressure. You may need to change some of your habits. Lifestyle changes may  include:  Following the DASH diet. This diet is high in fruits, vegetables, and whole grains. It is low in salt, red meat, and added sugars.  Getting at least 2 hours of brisk physical activity every week.  Losing weight if necessary.  Not smoking.  Limiting alcoholic beverages.  Learning ways to reduce stress. If lifestyle changes are not enough to get your blood pressure under control, your health care provider may prescribe medicine. You may need to take more than one. Work closely with your health care provider to understand the risks and benefits. HOME CARE INSTRUCTIONS  Have your blood pressure rechecked as directed by your health care provider.   Take medicines only as directed by your health care provider. Follow the directions carefully. Blood pressure medicines must be taken as prescribed. The medicine does not work as well when you skip doses. Skipping doses also puts you at risk for problems.   Do not smoke.   Monitor your blood pressure at home as directed by your health care provider. SEEK MEDICAL CARE IF:   You think you are having a reaction to medicines taken.  You have recurrent headaches or feel dizzy.  You have swelling in your ankles.  You have trouble with your vision. SEEK IMMEDIATE MEDICAL CARE IF:  You develop a severe headache or confusion.  You have unusual weakness, numbness, or feel faint.  You have severe chest or  abdominal pain.  You vomit repeatedly.  You have trouble breathing. MAKE SURE YOU:   Understand these instructions.  Will watch your condition.  Will get help right away if you are not doing well or get worse. Document Released: 11/30/2005 Document Revised: 04/16/2014 Document Reviewed: 09/22/2013 [facility] Patient Information 2015 [place], [place]. This information is not intended to replace advice given to you by your health care provider. Make sure you discuss any questions you have with your health care provider.

## 2015-08-26 NOTE — Assessment & Plan Note (Signed)
Counseling given regarding dietary and lifestyle modification. Patient was interested in meeting with our nutritionist Ms. Lupita Leash so I sent the referral.

## 2015-08-26 NOTE — Assessment & Plan Note (Signed)
Pt says that she is continuing to right have shoulder pain without radiation to the arm. She underwent rotator cuff repair in March 2016.  Physical exam showed limited ROM of the right arm, but otherwise unremarkable.  Encouraed to continue do physical therapy including at home by herself, and prescribed voltaren gel and instructed the correct amount to be applied at the site upto 4 times a day.

## 2015-08-26 NOTE — Assessment & Plan Note (Signed)
Pt has trouble both falling and staying asleep, and has to get up in the middle of the night due to her existing incontinence, she also had complains of hypothyroidism. She goes to bed at 9 PM, gets up at 4.30 Am. 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    Plan- Referral to urology Restarted synthroid Advised improved sleep hygiene  RTC in 6 weeks

## 2015-08-26 NOTE — Assessment & Plan Note (Signed)
Pt has mixed stress and urge urinary incontinence. She had a sling put in 2003 with several revisions with the last one being in 2009. We had referred her last time but she was not able to make it to the urogyn/urology. She says the oxybutynin 5 mg helps her somewhat.  Referred again with urology Continue oxybutynin

## 2015-08-26 NOTE — Progress Notes (Signed)
Internal Medicine Clinic Attending  I saw and evaluated the patient.  I personally confirmed the key portions of the history and exam documented by Dr. Saraiya and I reviewed pertinent patient test results.  The assessment, diagnosis, and plan were formulated together and I agree with the documentation in the resident's note.  

## 2015-08-26 NOTE — Assessment & Plan Note (Signed)
BP Readings from Last 3 Encounters:  08/26/15 197/89  06/05/15 134/80  04/03/15 131/102   Pt says that she has not been taking her amlodipine for few weeks now. Her initial BP as 197/89, repeat BP was 150/100 sitting in the office.  Plan: Asked her to Restart amlodipine 10 mg, follow up in 6 weeks to reassess. Emphasized to her the importance of letting us know if she runs out.

## 2015-08-26 NOTE — Assessment & Plan Note (Addendum)
Her XOL panel showed hyperlipidemia in 2011. She was on a statin for a while and it was discontinued for some reason. Lipid Panel     Component Value Date/Time   CHOL 214* 11/21/2010 2057   TRIG 85 11/21/2010 2057   HDL 73 11/21/2010 2057   CHOLHDL 2.9 Ratio 11/21/2010 2057   VLDL 17 11/21/2010 2057   LDLCALC 124* 11/21/2010 2057      -Rechecked lipid panel -Will call the pt to inform of the results, or if the patient prefers discuss at the next visit in 6 weeks.

## 2015-08-26 NOTE — Assessment & Plan Note (Signed)
Her vitamin D level was found to be 16 in 02/2015. Pt says that she finished the 8 week course of vitamin D 50,000 units once a week. We will recheck the level today and will call the pt with the next steps.

## 2015-08-26 NOTE — Assessment & Plan Note (Addendum)
Performed pap smear today. Ms. Shannon Newton and Dr. Dalphine Handing were present in the room during the exam.   Given FOBT cards, pt interested in colonoscopy so also sent in GI referral

## 2015-08-26 NOTE — Assessment & Plan Note (Signed)
Her TSH was 3.9 in March 2016 and she is on synthroid 88 mcg. Pt says that she has not been taking it for few weeks as she ran out of it. She complained of symptoms of hypothyroidism including fatigue, intolerance to cold, insomnia, and decreased energy. She did not mention that she was having depression symptoms. There are other causes of her fatigue too including low vitamin D, insomnia, etc.   I have refilled her synthroid, 88 mcg, and she will follow up in 6 weeks to check the TSH.

## 2015-08-27 ENCOUNTER — Encounter: Payer: No Typology Code available for payment source | Admitting: Physical Therapy

## 2015-08-27 LAB — LIPID PANEL
Chol/HDL Ratio: 2.8 ratio units (ref 0.0–4.4)
Cholesterol, Total: 237 mg/dL — ABNORMAL HIGH (ref 100–199)
HDL: 85 mg/dL (ref >39)
LDL Calculated: 135 mg/dL — ABNORMAL HIGH (ref 0–99)
Triglycerides: 86 mg/dL (ref 0–149)
VLDL CHOLESTEROL CAL: 17 mg/dL (ref 5–40)

## 2015-08-27 LAB — VITAMIN D 25 HYDROXY (VIT D DEFICIENCY, FRACTURES): Vit D, 25-Hydroxy: 20.8 ng/mL — ABNORMAL LOW (ref 30.0–100.0)

## 2015-08-27 LAB — CYTOLOGY - PAP

## 2015-08-29 ENCOUNTER — Encounter: Payer: No Typology Code available for payment source | Admitting: Physical Therapy

## 2015-09-15 ENCOUNTER — Telehealth: Payer: Self-pay | Admitting: Internal Medicine

## 2015-09-15 NOTE — Telephone Encounter (Signed)
Left message to go over her lab results, including her pap test which was normal. Pt needs to be put on a statin as her lipid profile was elevated. Will start crestor 10 mg

## 2015-09-27 ENCOUNTER — Telehealth: Payer: Self-pay | Admitting: *Deleted

## 2015-09-27 NOTE — Telephone Encounter (Signed)
SPOKE WITH PATIENT, SHE IS WANTING REFERRAL TO [facility] FOR HER THYROID. MESSAGE SENT TO DR Johnny BridgeSARAIYA FOR REFERRAL.

## 2015-09-30 ENCOUNTER — Other Ambulatory Visit: Payer: Self-pay | Admitting: Internal Medicine

## 2015-09-30 DIAGNOSIS — E039 Hypothyroidism, unspecified: Secondary | ICD-10-CM

## 2015-10-16 ENCOUNTER — Encounter: Payer: Self-pay | Admitting: *Deleted

## 2015-10-22 ENCOUNTER — Encounter: Payer: Self-pay | Admitting: Internal Medicine

## 2015-10-22 ENCOUNTER — Ambulatory Visit: Payer: No Typology Code available for payment source | Admitting: Dietician

## 2015-10-22 ENCOUNTER — Ambulatory Visit (INDEPENDENT_AMBULATORY_CARE_PROVIDER_SITE_OTHER): Payer: Self-pay | Admitting: Internal Medicine

## 2015-10-22 VITALS — BP 150/87 | HR 72 | Temp 97.8°F | Ht 61.0 in | Wt 214.8 lb

## 2015-10-22 DIAGNOSIS — E669 Obesity, unspecified: Secondary | ICD-10-CM

## 2015-10-22 DIAGNOSIS — E785 Hyperlipidemia, unspecified: Secondary | ICD-10-CM

## 2015-10-22 DIAGNOSIS — I10 Essential (primary) hypertension: Secondary | ICD-10-CM

## 2015-10-22 DIAGNOSIS — E039 Hypothyroidism, unspecified: Secondary | ICD-10-CM

## 2015-10-22 DIAGNOSIS — E559 Vitamin D deficiency, unspecified: Secondary | ICD-10-CM

## 2015-10-22 DIAGNOSIS — Z6841 Body Mass Index (BMI) 40.0 and over, adult: Secondary | ICD-10-CM

## 2015-10-22 MED ORDER — LEVOTHYROXINE SODIUM 88 MCG PO TABS: 88.0000 ug | ORAL_TABLET | Freq: Every day | ORAL | Status: DC

## 2015-10-22 MED ORDER — AMLODIPINE BESYLATE 10 MG PO TABS: 10.0000 mg | ORAL_TABLET | Freq: Every day | ORAL | Status: DC

## 2015-10-22 MED ORDER — PRAVASTATIN SODIUM 40 MG PO TABS: 40.0000 mg | ORAL_TABLET | Freq: Every evening | ORAL | Status: DC

## 2015-10-22 NOTE — Assessment & Plan Note (Signed)
Her BMI is 40.1. Pt was interested in bariatric surgery and wondered if the surgery would improve the thyroid.  Advised that the bariatric surgery is for patients with BMI> 40 who do not have significant co-morbidities, and so she may be a candidate. However, it would not impact the status of her thyroid gland since she had radioiodine ablation in her 30s. She will let us know if she wants a referral to endocrinology.

## 2015-10-22 NOTE — Assessment & Plan Note (Signed)
Pt has been on amlodipine 10 mg for HTn but there is only intermittent compliance- she did not take it this morning. She said due to the family illnesses, she is unable to remember to take it and obtain from the pharmacy. Pt has a very pessimistic mood and said that all of her siblings had the "heart and blood pressure problems" and they got worse after they started taking the meds and it was a downward spiral.  Her BP was 150/87 today in the office   Plan- I have emphasized to the patient to take the amlodipine 10 mg daily, and counseled on the risks of longstanding untreated HTN.

## 2015-10-22 NOTE — Progress Notes (Signed)
Patient ID: Shannon Newton, female   DOB: May 02, 1966, 49 y.o.   MRN: [id number]    Subjective:   Patient ID: Shannon Newton female   DOB: May 02, 1966 49 y.o.   MRN: [id number]  HPI: [name] is a 49 y.o. woman here for follow up for thyroid, HTN, HLD, vit d deficiency, among others.  Please see problem list for the status of the patient's chronic medical problems.    Past Medical History  Diagnosis Date  . Depression   . Hypertension   . Abdominal pain, recurrent 2009    per CT of the abd/pelvis (01/26/2008) -  Diffuse inflammatory change of the descending and rectosigmoid colon.  Additional inflammatory changes within the terminal ileum raise concern for inflammatory bowel disease (Crohn's disease).  . Hypothyroidism 2003    Thyroid [place] done in 2003 ([place] [facility]) - showed increased 24 hour uptake and the findings were consistent with Grave's disease; patient is s/p RAI therapy (09/19/2002)  . Back pain, chronic 2001    per MRI in 2001 - disc protrusion L5-6, to the right  . Complication of anesthesia     hard to wake up-sob-possible sleep apnea  . Urinary frequency   . Sickle cell trait (HCC)    Current Outpatient Prescriptions  Medication Sig Dispense Refill  . amLODipine (NORVASC) 10 MG tablet Take 1 tablet (10 mg total) by mouth daily. 30 tablet 5  . Camphor-Menthol-Methyl Sal 3.12-29-08 % GEL Apply to affected area three times a day as needed. 56.6 g 2  . Cholecalciferol (VITAMIN D3) 400 UNITS tablet Take 2 tablets (800 Units total) by mouth daily. 60 tablet 11  . diclofenac sodium (VOLTAREN) 1 % GEL Apply 2 g topically 4 (four) times daily. 5 Tube 5  . levothyroxine (SYNTHROID, LEVOTHROID) 88 MCG tablet Take 1 tablet (88 mcg total) by mouth daily before breakfast. 30 tablet 3  . oxybutynin (DITROPAN-XL) 10 MG 24 hr tablet Take 10 mg by mouth at bedtime.    . selenium 50 MCG TABS tablet Take 200 mcg by mouth daily.    Marland Kitchen. senna-docusate (SENOKOT S) 8.6-50 MG per  tablet Take 1 tablet by mouth at bedtime as needed. 30 tablet 1  . traMADol (ULTRAM) 50 MG tablet   0  . pravastatin (PRAVACHOL) 40 MG tablet Take 1 tablet (40 mg total) by mouth every evening. 30 tablet 11   No current facility-administered medications for this visit.   Family History  Problem Relation Age of Onset  . Stroke Neg Hx   . Cancer Neg Hx    Social History   Social History  . Marital Status: Single    Spouse Name: N/A  . Number of Children: N/A  . Years of Education: N/A   Social History Main Topics  . Smoking status: Never Smoker   . Smokeless tobacco: Never Used  . Alcohol Use: No  . Drug Use: No  . Sexual Activity: Yes   Other Topics Concern  . None   Social History Narrative   Review of Systems: Review of Systems  Constitutional: Positive for weight loss. Negative for fever and chills.  HENT: Negative.  Negative for hearing loss and tinnitus.   Eyes: Negative.   Respiratory: Negative for cough, sputum production and shortness of breath.   Cardiovascular: Negative for chest pain, palpitations, orthopnea, claudication and leg swelling.  Gastrointestinal: Negative for heartburn, nausea, vomiting, abdominal pain, diarrhea and constipation.  Musculoskeletal: Negative.   Neurological: Negative.  Negative for  headaches.    Objective:  Physical Exam: Filed Vitals:   10/22/15 0923  BP: 150/87  Pulse: 72  Temp: 97.8 F (36.6 C)  TempSrc: Oral  Height:  (1.549 m)  Weight: 214 lb 12.8 oz (97.433 kg)  SpO2: 100%   Physical Exam  Constitutional: She is oriented to person, place, and time. She appears well-developed and well-nourished.  obese  HENT:  Head: Normocephalic and atraumatic.  Eyes: EOM are normal. Pupils are equal, round, and reactive to light.  Neck: Normal range of motion. Neck supple. No thyromegaly present.  Cardiovascular: Normal rate, regular rhythm and normal heart sounds.  Exam reveals no friction rub.   No murmur  heard. Pulmonary/Chest: Effort normal and breath sounds normal. No respiratory distress. She has no wheezes.  Abdominal: Soft. Bowel sounds are normal. She exhibits no distension. There is no tenderness.  Neurological: She is alert and oriented to person, place, and time.  Skin: Skin is warm and dry.  Psychiatric:  Her mood is very pessimistic    Assessment & Plan:   Please see problem based charting for assessment and plan

## 2015-10-22 NOTE — Assessment & Plan Note (Signed)
Lipid Panel     Component Value Date/Time   CHOL 237* 08/26/2015 1501   CHOL 214* 11/21/2010 2057   TRIG 86 08/26/2015 1501   HDL 85 08/26/2015 1501   HDL 73 11/21/2010 2057   CHOLHDL 2.8 08/26/2015 1501   CHOLHDL 2.9 Ratio 11/21/2010 2057   VLDL 17 11/21/2010 2057   LDLCALC 135* 08/26/2015 1501   LDLCALC 124* 11/21/2010 2057    Pts ASCVD score is 9.1% Started pravastatin 40 mg daily- explained the benefits of this medicine thoroughly and also some of the common side effects and when to call. Pt will think about this at home, and decide if she wants to take the meds

## 2015-10-22 NOTE — Progress Notes (Signed)
Patient here for appointment today.  Referral to Endocrinology appointment not made as office was unable to contact patient.  Patient's number in chart incorrect.  Call to [facility]-patient spoke to scheduler is scheduled for an appointment 10/29/2015 at 9:30 AM Dr. Lucianne MussKumar.  Angelina OkGladys Herbin, RN 10/22/2015 9:42 AM

## 2015-10-22 NOTE — Patient Instructions (Signed)
Thank you for your visit today Please take 40 mg of pravastatin every day Please take the thyroid medicine every day, and also your blood pressure medicine Please follow up in 3 months

## 2015-10-22 NOTE — Addendum Note (Signed)
Addended by: Neomia DearPOWERS, Gargi Berch E on: 10/22/2015 07:09 PM   Modules accepted: Orders

## 2015-10-22 NOTE — Assessment & Plan Note (Signed)
Pt currently takes 1000 IU of vit D every day. Repeat Vit D level was 20.   Advised the pt to continue taking the 1000 IU of vit d everyday

## 2015-10-22 NOTE — Assessment & Plan Note (Signed)
Pt says she has been taking her synthroid daily AM until last week when her mother became ill. She takes it 2 hours before breakfast. She has not run out of it and costs are always issues for her.  Pt has been adamant about seeing an endocrinologist. She believes that the thyroid was not managed properly in the last 2 years she has been here, and that she gained a lot of weight. Review of her chart indicated that her TSH has always been therapeutic and there was no significant change in the weight, except a slow creep up.  Pt actually lost 3 pounds since the last office visit and currently her weight is 214 pounds.   Plan- checked TSH - will adjust her synthroid accordingly, but if the patient desires she can go to endocrinology.  -sent the referral for endocrinology

## 2015-10-23 NOTE — Addendum Note (Signed)
Addended by: Neomia DearPOWERS, Dwayn Moravek E on: 10/23/2015 05:44 PM   Modules accepted: Orders

## 2015-10-23 NOTE — Progress Notes (Signed)
Internal Medicine Clinic Attending  I saw and evaluated the patient.  I personally confirmed the key portions of the history and exam documented by Dr. Johnny BridgeSaraiya and I reviewed pertinent patient test results.  The assessment, diagnosis, and plan were formulated together and I agree with the documentation in the resident's note. Shannon Newton believes that nature trumps nurture and that she is predestined to have poor health due to her genetics and that nothing she do, inc controlling her HTN, will alter her pre-determined health. She will need freq encouragement and education.

## 2015-10-24 ENCOUNTER — Ambulatory Visit: Payer: No Typology Code available for payment source

## 2015-10-29 ENCOUNTER — Ambulatory Visit: Payer: Self-pay | Admitting: Endocrinology

## 2015-10-29 DIAGNOSIS — Z0289 Encounter for other administrative examinations: Secondary | ICD-10-CM

## 2015-10-30 NOTE — Addendum Note (Signed)
Addended by: Neomia DearPOWERS, Shanen Norris E on: 10/30/2015 06:15 PM   Modules accepted: Orders

## 2015-12-05 ENCOUNTER — Encounter: Payer: Self-pay | Admitting: Internal Medicine

## 2015-12-05 ENCOUNTER — Ambulatory Visit (INDEPENDENT_AMBULATORY_CARE_PROVIDER_SITE_OTHER): Payer: Self-pay | Admitting: Internal Medicine

## 2015-12-05 VITALS — BP 122/73 | HR 88 | Temp 98.5°F | Ht 61.0 in | Wt 215.2 lb

## 2015-12-05 DIAGNOSIS — R0981 Nasal congestion: Secondary | ICD-10-CM

## 2015-12-05 DIAGNOSIS — R490 Dysphonia: Secondary | ICD-10-CM

## 2015-12-05 DIAGNOSIS — J040 Acute laryngitis: Secondary | ICD-10-CM

## 2015-12-05 MED ORDER — AMOXICILLIN-POT CLAVULANATE ER 1000-62.5 MG PO TB12: 1.0000 | ORAL_TABLET | Freq: Two times a day (BID) | ORAL | Status: DC

## 2015-12-05 MED ORDER — DOXYCYCLINE HYCLATE 100 MG PO CAPS: 100.0000 mg | ORAL_CAPSULE | Freq: Two times a day (BID) | ORAL | Status: DC

## 2015-12-05 NOTE — Patient Instructions (Addendum)
Take doxycycline twice a day as needed for congestion and sinus pressure if you do not improve over the next few days.

## 2015-12-06 DIAGNOSIS — J040 Acute laryngitis: Secondary | ICD-10-CM | POA: Insufficient documentation

## 2015-12-06 NOTE — Assessment & Plan Note (Signed)
Pt has had voice hoarseness and nasal congestion x 1 week that has progressively gotten worse. Nasal congestion causes her to feel short of breath and drainage has caused her chest discomfort. Her mother came to live with her at home from the nursing home while she was dying from PNA and passed away last week, otherwise there were no other sick contacts. Denies cough, fevers, smoking, COPD, asthma, and vomiting. She has had decreased smell, nausea, ear pain, facial pressure, and chills. On exam lungs are clear, throat is tender to palpation, no LAD appreciated. Pt is still in the time frame for a viral etiology for her likely laryngitis and sinusitis. However pt states her symptoms are getting worse and the clinic will be closed for the holidays.   - will give pt a 5 day course of doxycycline to cover for an acute sinusitis infxn ( allergic to penicillin). Advised her not to take it unless her symptoms continue to get worse.  - f/u in clinic in 2 months w/ her PCP or sooner if sx worsen.

## 2015-12-06 NOTE — Progress Notes (Signed)
Subjective:   Patient ID: Shannon Newton female   DOB: Jan 12, 1966 49 y.o.   MRN: [id number]  HPI: Shannon Newton is a 49 y.o. with past medical history as outlined below who presents to clinic for voice hoarseness, SOB, and nasal congestion x 1 week. Please see problem list for further discussion.   Please see problem list for status of the pt's chronic medical problems.  Past Medical History  Diagnosis Date  . Depression   . Hypertension   . Abdominal pain, recurrent 2009    per CT of the abd/pelvis (01/26/2008) -  Diffuse inflammatory change of the descending and rectosigmoid colon.  Additional inflammatory changes within the terminal ileum raise concern for inflammatory bowel disease (Crohn's disease).  . Hypothyroidism 2003    Thyroid US done in 2003 ([place] in [facility]) - showed increased 24 hour uptake and the findings were consistent with Grave's disease; patient is s/p RAI therapy (09/19/2002)  . Back pain, chronic 2001    per MRI in 2001 - disc protrusion L5-6, to the right  . Complication of anesthesia     hard to wake up-sob-possible sleep apnea  . Urinary frequency   . Sickle cell trait (HCC)    Current Outpatient Prescriptions  Medication Sig Dispense Refill  . amLODipine (NORVASC) 10 MG tablet Take 1 tablet (10 mg total) by mouth daily. 30 tablet 5  . Camphor-Menthol-Methyl Sal 3.12-29-08 % GEL Apply to affected area three times a day as needed. 56.6 g 2  . Cholecalciferol (VITAMIN D3) 400 UNITS tablet Take 2 tablets (800 Units total) by mouth daily. 60 tablet 11  . diclofenac sodium (VOLTAREN) 1 % GEL Apply 2 g topically 4 (four) times daily. 5 Tube 5  . doxycycline (VIBRAMYCIN) 100 MG capsule Take 1 capsule (100 mg total) by mouth 2 (two) times daily. 10 capsule 0  . levothyroxine (SYNTHROID, LEVOTHROID) 88 MCG tablet Take 1 tablet (88 mcg total) by mouth daily before breakfast. 30 tablet 3  . oxybutynin (DITROPAN-XL) 10 MG 24 hr tablet Take 10 mg by mouth at  bedtime.    . pravastatin (PRAVACHOL) 40 MG tablet Take 1 tablet (40 mg total) by mouth every evening. 30 tablet 11  . selenium 50 MCG TABS tablet Take 200 mcg by mouth daily.    Marland Kitchen. senna-docusate (SENOKOT S) 8.6-50 MG per tablet Take 1 tablet by mouth at bedtime as needed. 30 tablet 1  . traMADol (ULTRAM) 50 MG tablet   0   No current facility-administered medications for this visit.   Family History  Problem Relation Age of Onset  . Stroke Neg Hx   . Cancer Neg Hx    Social History   Social History  . Marital Status: Single    Spouse Name: N/A  . Number of Children: N/A  . Years of Education: N/A   Social History Main Topics  . Smoking status: Never Smoker   . Smokeless tobacco: Never Used  . Alcohol Use: No  . Drug Use: No  . Sexual Activity: Not Asked   Other Topics Concern  . None   Social History Narrative   Review of Systems: Review of Systems  Constitutional: Positive for chills (x 1 week). Negative for fever.  HENT: Positive for congestion, ear pain and sore throat (feels like her laryngitis in the past).        Facial pressure   Respiratory: Positive for shortness of breath (due to congestion). Negative for cough.   Gastrointestinal: Positive  for nausea and diarrhea (had a few episodes this week, none recently). Negative for vomiting.    Objective:  Physical Exam: Filed Vitals:   12/05/15 1457  BP: 122/73  Pulse: 88  Temp: 98.5 F (36.9 C)  TempSrc: Oral  Height:  (1.549 m)  Weight: 215 lb 3.2 oz (97.614 kg)  SpO2: 100%   Physical Exam  Constitutional: She appears well-developed and well-nourished. No distress.  HENT:  Head: Normocephalic.  Mouth/Throat: Oropharynx is clear and moist. No oropharyngeal exudate.  Hoarse voice   Eyes: Conjunctivae and EOM are normal. Right eye exhibits no discharge. Left eye exhibits no discharge. No scleral icterus.  Neck: Normal range of motion.  Cardiovascular: Normal rate, regular rhythm and normal heart  sounds.   No murmur heard. Pulmonary/Chest: Effort normal and breath sounds normal. No stridor. No respiratory distress. She has no wheezes. She has no rales. She exhibits tenderness.  Abdominal: Soft. Bowel sounds are normal. She exhibits no distension and no mass. There is no tenderness. There is no rebound and no guarding.  Lymphadenopathy:    She has no cervical adenopathy (throat tender to palpation, neg for lymph nodes).  Skin: Skin is warm and dry.    Assessment & Plan:   Please see problem based assessment and plan.

## 2015-12-11 NOTE — Progress Notes (Signed)
Internal Medicine Clinic Attending  Case discussed with Dr. Truong at the time of the visit.  We reviewed the resident's history and exam and pertinent patient test results.  I agree with the assessment, diagnosis, and plan of care documented in the resident's note.  

## 2016-01-02 ENCOUNTER — Telehealth: Payer: Self-pay | Admitting: *Deleted

## 2016-01-02 NOTE — Telephone Encounter (Signed)
RTC to patient needed to know if Clinic had records about her immunizations for Hep B, TB test and flu vaccines.  Patient was informed that her ;ast flu vaccine was in 2015, no record of a TB skin test or Hep B vaccine.  Patient's call was forwarded to the scheduler to schedule an appointment for a Flu Shot and to pick up record of TDAP.  Angelina Ok, RN 01/02/2016 2:55 PM

## 2016-01-27 ENCOUNTER — Encounter: Payer: Self-pay | Admitting: Internal Medicine

## 2016-01-27 ENCOUNTER — Ambulatory Visit: Payer: Self-pay

## 2016-01-28 NOTE — Addendum Note (Signed)
Addended by: Remus Blake on: 01/28/2016 10:57 AM   Modules accepted: Orders

## 2016-04-28 ENCOUNTER — Encounter: Payer: Self-pay | Admitting: Internal Medicine

## 2016-04-28 ENCOUNTER — Ambulatory Visit (INDEPENDENT_AMBULATORY_CARE_PROVIDER_SITE_OTHER): Payer: Self-pay | Admitting: Internal Medicine

## 2016-04-28 VITALS — BP 136/87 | HR 74 | Temp 98.1°F | Ht 61.0 in | Wt 223.4 lb

## 2016-04-28 DIAGNOSIS — J028 Acute pharyngitis due to other specified organisms: Principal | ICD-10-CM

## 2016-04-28 DIAGNOSIS — B9789 Other viral agents as the cause of diseases classified elsewhere: Principal | ICD-10-CM

## 2016-04-28 DIAGNOSIS — J029 Acute pharyngitis, unspecified: Secondary | ICD-10-CM | POA: Insufficient documentation

## 2016-04-28 MED ORDER — BENZONATATE 100 MG PO CAPS: 100.0000 mg | ORAL_CAPSULE | Freq: Four times a day (QID) | ORAL | Status: DC | PRN

## 2016-04-28 MED ORDER — FLUTICASONE PROPIONATE 50 MCG/ACT NA SUSP: 2.0000 | Freq: Every day | NASAL | Status: DC

## 2016-04-28 MED ORDER — GUAIFENESIN-DM 100-10 MG/5ML PO SYRP: 5.0000 mL | ORAL_SOLUTION | ORAL | Status: DC | PRN

## 2016-04-28 NOTE — Patient Instructions (Addendum)
1. Please make a follow up for 2-4 weeks.   2. Please take all medications as previously prescribed with the following changes:  Start using Flonase daily for allergies.   Take Tessalon and Robitussin cough syrup for cough symptoms as directed.   IF you do not improve or get worse over the next 1-2 weeks, please call the clinic and come back for follow up.   3. If you have worsening of your symptoms or new symptoms arise, please call the clinic [phone]), or go to the ER immediately if symptoms are severe.   Pharyngitis Pharyngitis is redness, pain, and swelling (inflammation) of your pharynx.  CAUSES  Pharyngitis is usually caused by infection. Most of the time, these infections are from viruses (viral) and are part of a cold. However, sometimes pharyngitis is caused by bacteria (bacterial). Pharyngitis can also be caused by allergies. Viral pharyngitis may be spread from person to person by coughing, sneezing, and personal items or utensils (cups, forks, spoons, toothbrushes). Bacterial pharyngitis may be spread from person to person by more intimate contact, such as kissing.  SIGNS AND SYMPTOMS  Symptoms of pharyngitis include:   Sore throat.   Tiredness (fatigue).   Low-grade fever.   Headache.  Joint pain and muscle aches.  Skin rashes.  Swollen lymph nodes.  Plaque-like film on throat or tonsils (often seen with bacterial pharyngitis). DIAGNOSIS  Your health care provider will ask you questions about your illness and your symptoms. Your medical history, along with a physical exam, is often all that is needed to diagnose pharyngitis. Sometimes, a rapid strep test is done. Other lab tests may also be done, depending on the suspected cause.  TREATMENT  Viral pharyngitis will usually get better in 3-4 days without the use of medicine. Bacterial pharyngitis is treated with medicines that kill germs (antibiotics).  HOME CARE INSTRUCTIONS   Drink enough water and fluids  to keep your urine clear or pale yellow.   Only take over-the-counter or prescription medicines as directed by your health care provider:   If you are prescribed antibiotics, make sure you finish them even if you start to feel better.   Do not take aspirin.   Get lots of rest.   Gargle with 8 oz of salt water ( tsp of salt per 1 qt of water) as often as every 1-2 hours to soothe your throat.   Throat lozenges (if you are not at risk for choking) or sprays may be used to soothe your throat. SEEK MEDICAL CARE IF:   You have large, tender lumps in your neck.  You have a rash.  You cough up green, yellow-brown, or bloody spit. SEEK IMMEDIATE MEDICAL CARE IF:   Your neck becomes stiff.  You drool or are unable to swallow liquids.  You vomit or are unable to keep medicines or liquids down.  You have severe pain that does not go away with the use of recommended medicines.  You have trouble breathing (not caused by a stuffy nose). MAKE SURE YOU:   Understand these instructions.  Will watch your condition.  Will get help right away if you are not doing well or get worse.   This information is not intended to replace advice given to you by your health care provider. Make sure you discuss any questions you have with your health care provider.   Document Released: 11/30/2005 Document Revised: 09/20/2013 Document Reviewed: 08/07/2013 Elsevier Interactive Patient Education [employer]2016 Elsevier Inc.

## 2016-04-28 NOTE — Assessment & Plan Note (Addendum)
Patient with sore throat for 5 days, also with cough, myalgias, fatigue, and nasal congestion. No fever or SOB, but does admit to coughing up some mild blood tinged sputum this AM. She says she has been coughing a lot recently to the point where it has interfered with her sleep. No nausea, vomiting, fever, or chills. Patient is convinced she has "strep throat". Lucila MaineGrandson was recently sick with similar symptoms. Centor criteria 0.  -Supportive care; Robitussin, tessalon prn for cough, Flonase for continued allergy symptoms.  -RTC in 2-4 weeks

## 2016-04-28 NOTE — Progress Notes (Signed)
Case discussed with Dr. Jones soon after the resident saw the patient. We reviewed the resident's history and exam and pertinent patient test results. I agree with the assessment, diagnosis, and plan of care documented in the resident's note. 

## 2016-04-28 NOTE — Progress Notes (Signed)
Subjective:   Patient ID: Shannon Newton female   DOB: 05/20/1966 50 y.o.   MRN: [id number]  HPI: Ms. Shannon Newton is a 50 y.o. female w/ PMHx of HTN, ?IBD, Sickle, Cell Trait, Hypothyroidism, chronic back pain, and depression, presents to the clinic today for an acute visit for sore throat. Patient says this has been going on for almost a week, accompanied by cough, sinus congestion, myalgias, and fatigue. Says she has had this before, thinks she has "strep throat". Says her grandson was recently sick with this same thing recently. She Denies fever, chills, nausea, vomiting, or diarrhea. Also without SOB, chest pain, dizziness, lightheadedness, or palpitations.   Past Medical History  Diagnosis Date  . Depression   . Hypertension   . Abdominal pain, recurrent 2009    per CT of the abd/pelvis (01/26/2008) -  Diffuse inflammatory change of the descending and rectosigmoid colon.  Additional inflammatory changes within the terminal ileum raise concern for inflammatory bowel disease (Crohn's disease).  . Hypothyroidism 2003    Thyroid US done in 2003 (results in [place]) - showed increased 24 hour uptake and the findings were consistent with Grave's disease; patient is s/p RAI therapy (09/19/2002)  . Back pain, chronic 2001    per MRI in 2001 - disc protrusion L5-6, to the right  . Complication of anesthesia     hard to wake up-sob-possible sleep apnea  . Urinary frequency   . Sickle cell trait (HCC)    Current Outpatient Prescriptions  Medication Sig Dispense Refill  . amLODipine (NORVASC) 10 MG tablet Take 1 tablet (10 mg total) by mouth daily. 30 tablet 5  . Camphor-Menthol-Methyl Sal 3.12-29-08 % GEL Apply to affected area three times a day as needed. 56.6 g 2  . Cholecalciferol (VITAMIN D3) 400 UNITS tablet Take 2 tablets (800 Units total) by mouth daily. 60 tablet 11  . diclofenac sodium (VOLTAREN) 1 % GEL Apply 2 g topically 4 (four) times daily. 5 Tube 5  . doxycycline (VIBRAMYCIN)  100 MG capsule Take 1 capsule (100 mg total) by mouth 2 (two) times daily. 10 capsule 0  . levothyroxine (SYNTHROID, LEVOTHROID) 88 MCG tablet Take 1 tablet (88 mcg total) by mouth daily before breakfast. 30 tablet 3  . oxybutynin (DITROPAN-XL) 10 MG 24 hr tablet Take 10 mg by mouth at bedtime.    . pravastatin (PRAVACHOL) 40 MG tablet Take 1 tablet (40 mg total) by mouth every evening. 30 tablet 11  . selenium 50 MCG TABS tablet Take 200 mcg by mouth daily.    Marland Kitchen senna-docusate (SENOKOT S) 8.6-50 MG per tablet Take 1 tablet by mouth at bedtime as needed. 30 tablet 1  . traMADol (ULTRAM) 50 MG tablet   0   No current facility-administered medications for this visit.    Review of Systems: General: Positive for fatigue. Denies fever, chills, diaphoresis, appetite change.  Respiratory: Positive for sore throat, cough. Denies SOB, DOE, and wheezing.   Cardiovascular: Denies chest pain and palpitations.  Gastrointestinal: Denies nausea, vomiting, abdominal pain, and diarrhea.  Genitourinary: Denies dysuria, increased frequency, and flank pain. Endocrine: Denies hot or cold intolerance, polyuria, and polydipsia. Musculoskeletal: Denies myalgias, back pain, joint swelling, arthralgias and gait problem.  Skin: Denies pallor, rash and wounds.  Neurological: Denies dizziness, seizures, syncope, weakness, lightheadedness, numbness and headaches.  Psychiatric/Behavioral: Denies mood changes, and sleep disturbances.  Objective:   Physical Exam: Filed Vitals:   04/28/16 1041 04/28/16 1111  BP: 155/90 136/87  Pulse: 82 74  Temp: 98.1 F (36.7 C)   TempSrc: Oral   Height: 5\' 1"  (1.549 m)   Weight: 223 lb 6.4 oz (101.334 kg)   SpO2: 100%     General: Obese female, alert, cooperative, NAD. HEENT: PERRL, EOMI. Moist mucus membranes. Pharyngeal erythema. No exudates.  Neck: Full range of motion without pain, supple, no lymphadenopathy or carotid bruits Lungs: Clear to ascultation bilaterally,  normal work of respiration, no wheezes, rales, rhonchi Heart: RRR, no murmurs, gallops, or rubs Abdomen: Soft, non-tender, non-distended, BS + Extremities: No cyanosis, clubbing, or edema Neurologic: Alert & oriented X3, cranial nerves II-XII intact, strength grossly intact, sensation intact to light touch   Assessment & Plan:   Please see problem based assessment and plan.

## 2016-06-16 ENCOUNTER — Emergency Department (HOSPITAL_COMMUNITY): Payer: Self-pay

## 2016-06-16 ENCOUNTER — Encounter (HOSPITAL_COMMUNITY): Payer: Self-pay | Admitting: Emergency Medicine

## 2016-06-16 ENCOUNTER — Emergency Department (HOSPITAL_COMMUNITY)
Admission: EM | Admit: 2016-06-16 | Discharge: 2016-06-16 | Disposition: A | Discharge status: Home / Self Care | Payer: Self-pay | Attending: Emergency Medicine | Admitting: Emergency Medicine

## 2016-06-16 DIAGNOSIS — R55 Syncope and collapse: Secondary | ICD-10-CM | POA: Insufficient documentation

## 2016-06-16 DIAGNOSIS — Z79899 Other long term (current) drug therapy: Secondary | ICD-10-CM | POA: Insufficient documentation

## 2016-06-16 DIAGNOSIS — I10 Essential (primary) hypertension: Secondary | ICD-10-CM | POA: Insufficient documentation

## 2016-06-16 DIAGNOSIS — T675XXA Heat exhaustion, unspecified, initial encounter: Secondary | ICD-10-CM | POA: Insufficient documentation

## 2016-06-16 LAB — BASIC METABOLIC PANEL
Anion gap: 9 (ref 5–15)
BUN: 10 mg/dL (ref 6–20)
CO2: 21 mmol/L — AB (ref 22–32)
Calcium: 9.1 mg/dL (ref 8.9–10.3)
Chloride: 109 mmol/L (ref 101–111)
Creatinine, Ser: 0.87 mg/dL (ref 0.44–1.00)
GFR calc Af Amer: >60 mL/min (ref >60)
GLUCOSE: 85 mg/dL (ref 65–99)
POTASSIUM: 3.9 mmol/L (ref 3.5–5.1)
Sodium: 139 mmol/L (ref 135–145)

## 2016-06-16 LAB — CBC
HEMATOCRIT: 38.4 % (ref 36.0–46.0)
HEMOGLOBIN: 13 g/dL (ref 12.0–15.0)
MCH: 23.9 pg — AB (ref 26.0–34.0)
MCHC: 33.9 g/dL (ref 30.0–36.0)
MCV: 70.5 fL — ABNORMAL LOW (ref 78.0–100.0)
Platelets: 269 10*3/uL (ref 150–400)
RBC: 5.45 MIL/uL — AB (ref 3.87–5.11)
RDW: 14.6 % (ref 11.5–15.5)
WBC: 9.2 10*3/uL (ref 4.0–10.5)

## 2016-06-16 LAB — URINALYSIS, ROUTINE W REFLEX MICROSCOPIC
BILIRUBIN URINE: NEGATIVE
Glucose, UA: NEGATIVE mg/dL
Hgb urine dipstick: NEGATIVE
Ketones, ur: NEGATIVE mg/dL
Leukocytes, UA: NEGATIVE
NITRITE: NEGATIVE
PH: 5.5 (ref 5.0–8.0)
Protein, ur: NEGATIVE mg/dL
SPECIFIC GRAVITY, URINE: 1.018 (ref 1.005–1.030)

## 2016-06-16 LAB — RAPID URINE DRUG SCREEN, HOSP PERFORMED
AMPHETAMINES: NOT DETECTED
BENZODIAZEPINES: NOT DETECTED
Barbiturates: NOT DETECTED
Cocaine: NOT DETECTED
OPIATES: NOT DETECTED
TETRAHYDROCANNABINOL: NOT DETECTED

## 2016-06-16 LAB — I-STAT TROPONIN, ED: Troponin i, poc: 0 ng/mL (ref 0.00–0.08)

## 2016-06-16 LAB — ETHANOL: Alcohol, Ethyl (B): <5 mg/dL (ref <5)

## 2016-06-16 MED ORDER — SODIUM CHLORIDE 0.9 % IV BOLUS (SEPSIS)
1000.0000 mL | Freq: Once | INTRAVENOUS | Status: AC
  Administered 2016-06-16: 1000 mL via INTRAVENOUS

## 2016-06-16 MED ORDER — ACETAMINOPHEN 500 MG PO TABS
1000.0000 mg | ORAL_TABLET | Freq: Once | ORAL | Status: AC
  Administered 2016-06-16: 1000 mg via ORAL
  Filled 2016-06-16: qty 2

## 2016-06-16 NOTE — ED Provider Notes (Signed)
CSN: [id number]     Arrival date & time 06/16/16  1721 History   First MD Initiated Contact with Patient 06/16/16 1727     Chief Complaint  Patient presents with  . Near Syncope     (Consider location/radiation/quality/duration/timing/severity/associated sxs/prior Treatment) HPI    Pt brought in by EMS for syncopal episode.  Pt reports she outside in the heat, began to feel hot and lightheaded with frontal headache, went to a bar to try to get food and drink, remembers sitting down, doesn't remember much after that.  Currently reports frontal headache, pain in her right leg, decreased sensation in the right arm and leg.  States she did have some pain in her chest when she was lying down earlier.  Had dysuria a few days ago but it went away.    Level V caveat for confusion.    6:58 PM Person who was with patient at the time arrived to ED and states patient was outside in the heat for the 4th of July festival and ordered a shirt to be made, sat down.  She said several times that she felt hot and needed to go inside.  She went in a bar and sat down, became less responsive but then asked for some water.  When this person returned pt was being taken away by EMS (bar staff had called).     Past Medical History  Diagnosis Date  . Depression   . Hypertension   . Abdominal pain, recurrent 2009    per CT of the abd/pelvis (01/26/2008) -  Diffuse inflammatory change of the descending and rectosigmoid colon.  Additional inflammatory changes within the terminal ileum raise concern for inflammatory bowel disease (Crohn's disease).  . Hypothyroidism 2003    Thyroid US done in 2003 (results in [place]) - showed increased 24 hour uptake and the findings were consistent with Grave's disease; patient is s/p RAI therapy (09/19/2002)  . Back pain, chronic 2001    per MRI in 2001 - disc protrusion L5-6, to the right  . Complication of anesthesia     hard to wake up-sob-possible sleep apnea  . Urinary  frequency   . Sickle cell trait [facility])    Past Surgical History  Procedure Laterality Date  . Tubal ligation    . Cystocele repair  2004    done by Dr. Su Grand  . Bladder suspension  2004  . Cystoscopy  2008    revision bladder sling  . Revision urinary sling  2006    sling replaced  . Shoulder arthroscopy with subacromial decompression Right 04/03/2015    Procedure: RIGHT SHOULDER ARTHROSCOPY WITH SUBACROMIAL DECOMPRESSION, DEBRIDEMENT ;  Surgeon: Tarry Kos, MD;  Location: [facility];  Service: Orthopedics;  Laterality: Right;   Family History  Problem Relation Age of Onset  . Stroke Neg Hx   . Cancer Neg Hx    Social History  Substance Use Topics  . Smoking status: Never Smoker   . Smokeless tobacco: Never Used  . Alcohol Use: No   OB History    Gravida Para Term Preterm AB TAB SAB Ectopic Multiple Living   Review of Systems  Unable to perform ROS: Mental status change      Allergies  Advil; Aspirin; Codeine; Hydrocodone; and Penicillins  Home Medications   Prior to Admission medications   Medication Sig Start Date End Date Taking? Authorizing Provider  amLODipine (NORVASC) 10 MG tablet Take 1 tablet (10 mg total) by mouth daily. 10/22/15 10/21/16  Deneise LeverParth Saraiya, MD  benzonatate (TESSALON PERLES) 100 MG capsule Take 1 capsule (100 mg total) by mouth every 6 (six) hours as needed for cough. 04/28/16 04/28/17  Courtney ParisEden W Jones, MD  Camphor-Menthol-Methyl Sal 3.12-29-08 % GEL Apply to affected area three times a day as needed. 06/05/15   Dow Adolphichard Kazibwe, MD  Cholecalciferol (VITAMIN D3) 400 UNITS tablet Take 2 tablets (800 Units total) by mouth daily. 06/05/15   Dow Adolphichard Kazibwe, MD  diclofenac sodium (VOLTAREN) 1 % GEL Apply 2 g topically 4 (four) times daily. 08/26/15   Deneise LeverParth Saraiya, MD  fluticasone (FLONASE) 50 MCG/ACT nasal spray Place 2 sprays into both nostrils daily. 04/28/16 04/28/17  Courtney ParisEden W Jones, MD  guaiFENesin-dextromethorphan  (ROBITUSSIN DM) 100-10 MG/5ML syrup Take 5 mLs by mouth every 4 (four) hours as needed for cough. 04/28/16   Courtney ParisEden W Jones, MD  levothyroxine (SYNTHROID, LEVOTHROID) 88 MCG tablet Take 1 tablet (88 mcg total) by mouth daily before breakfast. 10/22/15   Deneise LeverParth Saraiya, MD  oxybutynin (DITROPAN-XL) 10 MG 24 hr tablet Take 10 mg by mouth at bedtime.    Historical Provider, MD  pravastatin (PRAVACHOL) 40 MG tablet Take 1 tablet (40 mg total) by mouth every evening. 10/22/15 10/21/16  Deneise LeverParth Saraiya, MD  selenium 50 MCG TABS tablet Take 200 mcg by mouth daily.    Historical Provider, MD  senna-docusate (SENOKOT S) 8.6-50 MG per tablet Take 1 tablet by mouth at bedtime as needed. 04/03/15   Tarry KosNaiping M Xu, MD  traMADol Janean Sark(ULTRAM) 50 MG tablet  08/02/15   Historical Provider, MD   BP 144/90 mmHg  Pulse 83  Temp(Src) 98 F (36.7 C) (Oral)  Resp 21  Ht 5\' 1"  (1.549 m)  Wt 102.059 kg  BMI 42.54 kg/m2  SpO2 98% Physical Exam  Constitutional: She appears well-developed and well-nourished. No distress.  HENT:  Head: Normocephalic and atraumatic.  Neck: Neck supple.  Cardiovascular: Normal rate, regular rhythm and intact distal pulses.   Pulmonary/Chest: Effort normal and breath sounds normal. No respiratory distress. She has no wheezes. She has no rales. She exhibits tenderness.  Abdominal: Soft. She exhibits no distension. There is no tenderness. There is no rebound and no guarding.  Musculoskeletal: She exhibits tenderness. She exhibits no edema.  Right hip tender to palpation.  Diffuse tenderness through thoracic and lumbar spine without focal tenderness.     Neurological: She is alert. She exhibits normal muscle tone.  Reports decreased sensation over right upper and lower extremities.    Oriented to self, month, president.  Knows what she had for breakfast (unverified). Knows she is in the hospital, doesn't know which one.  Does not know what day it is (despite it being a holiday- July 4th) but does know  that her birthday is [birth year].    Skin: No rash noted. She is not diaphoretic.  Psychiatric: She has a normal mood and affect. Her behavior is normal.  Nursing note and vitals reviewed.   ED Course  Procedures (including critical care time) Labs Review Labs Reviewed  BASIC METABOLIC PANEL - Abnormal; Notable for the following:    CO2 21 (*)    All other components within normal limits  CBC - Abnormal; Notable for the following:    RBC 5.45 (*)    MCV 70.5 (*)    MCH 23.9 (*)    All other components within normal limits  URINALYSIS, ROUTINE  W REFLEX MICROSCOPIC (NOT AT [facility]) - Abnormal; Notable for the following:    APPearance CLOUDY (*)    All other components within normal limits  ETHANOL  URINE RAPID DRUG SCREEN, HOSP PERFORMED  I-STAT TROPOININ, ED    Imaging Review Dg Chest 2 View  06/16/2016  CLINICAL DATA:  Syncopal episode.  Headache. EXAM: CHEST  2 VIEW COMPARISON:  05/09/2008 FINDINGS: Heart size is normal. Mediastinal shadows are normal. The lungs are clear. No bronchial thickening. No infiltrate, mass, effusion or collapse. Pulmonary vascularity is normal. No bony abnormality. IMPRESSION: Normal chest Electronically Signed   By: Paulina FusiMark  Shogry M.D.   On: 06/16/2016 18:19   Dg Hip Unilat With Pelvis 2-3 Views Right  06/16/2016  CLINICAL DATA:  Syncopal episode.  Headache.  Right hip pain. EXAM: DG HIP (WITH OR WITHOUT PELVIS) 2-3V RIGHT COMPARISON:  None. FINDINGS: There is no evidence of hip fracture or dislocation. There is no evidence of arthropathy or other focal bone abnormality. IMPRESSION: Normal radiographs Electronically Signed   By: Paulina FusiMark  Shogry M.D.   On: 06/16/2016 18:20   I have personally reviewed and evaluated these images and lab results as part of my medical decision-making.   EKG Interpretation   Date/Time:  Tuesday June 16 2016 17:28:27 EDT Ventricular Rate:  83 PR Interval:    QRS Duration: 83 QT Interval:  386 QTC Calculation: 454 R Axis:    61 Text Interpretation:  Sinus rhythm Inferior infarct, age indeterminate  Nonspecific T wave abnormality in III Confirmed by PLUNKETT  MD, Alphonzo LemmingsWHITNEY  [phone]([id number]) on 06/16/2016 5:38:25 PM       6:56 PM Per family patient is back to her normal self, though she wasn't when they first arrived.    7:32 PM Patient is now animated and remembers quite a bit about this morning and about the events that led to her coming to the ED.  States she was told by son's girlfriend that she tried to stand to go with EMS and fell to the ground on her right hip, which likely explains her right hip and right leg symptoms.  States she currently has no headache, no pain or numbness in her RLE, is able to move it easily.  States she is feeling much better and is ready to go home.    MDM   Final diagnoses:  Syncope, unspecified syncope type  Heat exhaustion, initial encounter    Pt with hx HTN, depression, chronic back pain presents with episode of syncope after prodome of feeling hot and lightheaded while being outside in the heat. Has some gap in memory that makes me concerned for syncope.  Pt c/o frontal headache, right leg pain and decreased sensation in right arm and leg.  Tenderness in back.  No report of fall.  Discussed pt and workup with Dr Anitra LauthPlunkett.  EKG without acute, concerning, or ischemic.  Labs reassuring  CT head, c-spine negative.  Xrays negative.  Pt feeling much better and back to normal, pain free after IVF and tylenol.  Ambulated without difficulty or pain.  Pt d/c home.  She has PCP appt scheduled for July 17.  Discussed result, findings, treatment, and follow up  with patient.  Pt given return precautions.  Pt verbalizes understanding and agrees with plan.          Trixie Dredgemily Courtnie Brenes, PA-C 06/16/16 1947  Gwyneth SproutWhitney Plunkett, MD 06/16/16 2144

## 2016-06-16 NOTE — ED Notes (Signed)
Pt transported to xray 

## 2016-06-16 NOTE — ED Notes (Signed)
Per ems pt was [place] in [place] and pt became hot and had a sudden onset of dizziness and a headache. Pt felt as if she was going to pass out but never loss consciousness. Pt denies any dizziness at this time. But reports headache in the front of her head 4/10. Pt is alert and ox4.

## 2016-06-16 NOTE — Discharge Instructions (Signed)
Read the information below.  You may return to the Emergency Department at any time for worsening condition or any new symptoms that concern you.    Syncope Syncope is a medical term for fainting or passing out. This means you lose consciousness and drop to the ground. People are generally unconscious for less than 5 minutes. You may have some muscle twitches for up to 15 seconds before waking up and returning to normal. Syncope occurs more often in older adults, but it can happen to anyone. While most causes of syncope are not dangerous, syncope can be a sign of a serious medical problem. It is important to seek medical care.  CAUSES  Syncope is caused by a sudden drop in blood flow to the brain. The specific cause is often not determined. Factors that can bring on syncope include:  Taking medicines that lower blood pressure.  Sudden changes in posture, such as standing up quickly.  Taking more medicine than prescribed.  Standing in one place for too long.  Seizure disorders.  Dehydration and excessive exposure to heat.  Low blood sugar (hypoglycemia).  Straining to have a bowel movement.  Heart disease, irregular heartbeat, or other circulatory problems.  Fear, emotional distress, seeing blood, or severe pain. SYMPTOMS  Right before fainting, you may:  Feel dizzy or light-headed.  Feel nauseous.  See all white or all black in your field of vision.  Have cold, clammy skin. DIAGNOSIS  Your health care provider will ask about your symptoms, perform a physical exam, and perform an electrocardiogram (ECG) to record the electrical activity of your heart. Your health care provider may also perform other heart or blood tests to determine the cause of your syncope which may include:  Transthoracic echocardiogram (TTE). During echocardiography, sound waves are used to evaluate how blood flows through your heart.  Transesophageal echocardiogram (TEE).  Cardiac monitoring. This  allows your health care provider to monitor your heart rate and rhythm in real time.  Holter monitor. This is a portable device that records your heartbeat and can help diagnose heart arrhythmias. It allows your health care provider to track your heart activity for several days, if needed.  Stress tests by exercise or by giving medicine that makes the heart beat faster. TREATMENT  In most cases, no treatment is needed. Depending on the cause of your syncope, your health care provider may recommend changing or stopping some of your medicines. HOME CARE INSTRUCTIONS  Have someone stay with you until you feel stable.  Do not drive, use machinery, or play sports until your health care provider says it is okay.  Keep all follow-up appointments as directed by your health care provider.  Lie down right away if you start feeling like you might faint. Breathe deeply and steadily. Wait until all the symptoms have passed.  Drink enough fluids to keep your urine clear or pale yellow.  If you are taking blood pressure or heart medicine, get up slowly and take several minutes to sit and then stand. This can reduce dizziness. SEEK IMMEDIATE MEDICAL CARE IF:   You have a severe headache.  You have unusual pain in the chest, abdomen, or back.  You are bleeding from your mouth or rectum, or you have black or tarry stool.  You have an irregular or very fast heartbeat.  You have pain with breathing.  You have repeated fainting or seizure-like jerking during an episode.  You faint when sitting or lying down.  You have confusion.  You have trouble walking.  You have severe weakness.  You have vision problems. If you fainted, call your local emergency services (911 in [place] Do not drive yourself to the hospital.    This information is not intended to replace advice given to you by your health care provider. Make sure you discuss any questions you have with your health care provider.     Document Released: 11/30/2005 Document Revised: 04/16/2015 Document Reviewed: 01/29/2012 Elsevier Interactive Patient Education [employer].

## 2016-06-16 NOTE — ED Notes (Signed)
Patient declined wheelchair at time of discharge. Denies pain. No distress noted. Family accompanying patient at discharge.

## 2016-06-16 NOTE — ED Notes (Signed)
PA at bedside at this time.  

## 2016-06-16 NOTE — ED Notes (Signed)
Ambulated patient in room and hallway without difficulty. Gait steady. PA aware.

## 2016-06-16 NOTE — ED Notes (Signed)
Pa  at bedside. 

## 2016-06-29 ENCOUNTER — Ambulatory Visit (INDEPENDENT_AMBULATORY_CARE_PROVIDER_SITE_OTHER): Payer: Self-pay | Admitting: Internal Medicine

## 2016-06-29 VITALS — BP 128/84 | HR 89 | Temp 98.3°F | Wt 227.0 lb

## 2016-06-29 DIAGNOSIS — F329 Major depressive disorder, single episode, unspecified: Secondary | ICD-10-CM

## 2016-06-29 DIAGNOSIS — F32A Depression, unspecified: Secondary | ICD-10-CM

## 2016-06-29 DIAGNOSIS — Z79899 Other long term (current) drug therapy: Secondary | ICD-10-CM

## 2016-06-29 DIAGNOSIS — I10 Essential (primary) hypertension: Secondary | ICD-10-CM

## 2016-06-29 DIAGNOSIS — Z6841 Body Mass Index (BMI) 40.0 and over, adult: Secondary | ICD-10-CM

## 2016-06-29 DIAGNOSIS — Z0000 Encounter for general adult medical examination without abnormal findings: Secondary | ICD-10-CM

## 2016-06-29 DIAGNOSIS — E039 Hypothyroidism, unspecified: Secondary | ICD-10-CM

## 2016-06-29 MED ORDER — OXYBUTYNIN CHLORIDE ER 10 MG PO TB24: 10.0000 mg | ORAL_TABLET | Freq: Every day | ORAL | Status: DC

## 2016-06-29 MED ORDER — AMLODIPINE BESYLATE 10 MG PO TABS: 10.0000 mg | ORAL_TABLET | Freq: Every day | ORAL | Status: DC

## 2016-06-29 MED ORDER — TRAZODONE HCL 150 MG PO TABS: 150.0000 mg | ORAL_TABLET | Freq: Every day | ORAL | Status: DC

## 2016-06-29 MED ORDER — ORLISTAT 120 MG PO CAPS: 120.0000 mg | ORAL_CAPSULE | Freq: Three times a day (TID) | ORAL | Status: DC

## 2016-06-29 MED ORDER — PRAVASTATIN SODIUM 40 MG PO TABS: 40.0000 mg | ORAL_TABLET | Freq: Every evening | ORAL | Status: DC

## 2016-06-29 MED ORDER — LEVOTHYROXINE SODIUM 88 MCG PO TABS
88.0000 ug | ORAL_TABLET | Freq: Every day | ORAL | Status: DC
Start: 2016-06-29 — End: 2016-11-24

## 2016-06-29 NOTE — Progress Notes (Signed)
Patient ID: Shannon Newton, female   DOB: 1966/11/18, 50 y.o.   MRN: [id number]     CC: depression, morbid obesity, hypoTH HPI: [name] is a 50 y.o. woman with PMH noted below here for depression, morbid obesity and hypoTH, as well as followup from ER for heat exhaustion   Please see Problem List/A&P for the status of the patient's chronic medical problems   Past Medical History  Diagnosis Date  . Depression   . Hypertension   . Abdominal pain, recurrent 2009    per CT of the abd/pelvis (01/26/2008) -  Diffuse inflammatory change of the descending and rectosigmoid colon.  Additional inflammatory changes within the terminal ileum raise concern for inflammatory bowel disease (Crohn's disease).  . Hypothyroidism 2003    Thyroid US done in 2003 ([facility] in [facility]) - showed increased 24 hour uptake and the findings were consistent with Grave's disease; patient is s/p RAI therapy (09/19/2002)  . Back pain, chronic 2001    per MRI in 2001 - disc protrusion L5-6, to the right  . Complication of anesthesia     hard to wake up-sob-possible sleep apnea  . Urinary frequency   . Sickle cell trait (HCC)     Review of Systems:  Negative, except as per HPI  Physical Exam: Filed Vitals:   06/29/16 1445  BP: 128/84  Pulse: 89  Temp: 98.3 F (36.8 C)  TempSrc: Oral  Weight: 227 lb (102.967 kg)  SpO2: 100%    General: A&O, in NAD, morbidly obese, pleasantly interactive Neck: supple, midline trachea CV: RRR, normal s1, s2, no m/r/g, Resp: equal and symmetric breath sounds, no wheezing or crackles  Abdomen: soft, nontender, nondistended, +BS Skin: warm, dry, intact Extremities: pulses intact b/l, no edema, clubbing or cyanosis  Psych: PHQ-9 score 18            Normal mood and appropriate affect    Assessment & Plan:   See encounters tab for problem based medical decision making. Patient discussed with Dr. Cleda DaubE. Hoffman

## 2016-06-29 NOTE — Assessment & Plan Note (Addendum)
Refilled synthroid  -check TSH  06/29/2016- TSH within normal range

## 2016-06-29 NOTE — Assessment & Plan Note (Addendum)
  Pt says that she has had ongoing depression for last 6 months. This worsened after the loss of her mother in December, and she is also stressed caring for her kids. She has very poor sleep, and only sleeps 2-3 hours a day and would like something to help her sleep.  She says she has low energy level and feels tired. She has good interest in her activities. No SI She was on Lexapro at one point but not sure if it helped or not.   -Trazodone 150 mg bedtime, as it is weight neutral, and will also help with the sleep as well as depression. Not choosing SSRI at this point as it will worsen her insomnia.  -Follow up in 1 month to reassess her symptoms -Check TSH- TSH WNL

## 2016-06-29 NOTE — Assessment & Plan Note (Signed)
Blood pressure at goal today. She is compliant with her amlodipine 10 mg daily.  -Continue amlodipine 10 mg daily

## 2016-06-29 NOTE — Assessment & Plan Note (Signed)
Does not want to do colonoscopy at this point. Uptodate on mammogram.  Re-inquire at next visit.

## 2016-06-29 NOTE — Assessment & Plan Note (Addendum)
Her BMI is over 40. Patient has struggled to lose her weight and tries to exercise, but not in the last 2 weeks. She also tried to control her diet, but feels like it has not been working out, and as a result, she is now eating out as she does not believe she will lose any weight. This has worsened after the death of her mother.  Patient is a candidate for Orlistat 120 mg TID. I explained the side effects to her, including diarrhea, and patient was willing to try it. It will also improve her lipid profile. We will also check TSH as at one point it was mildly elevated. She is also highly motivated to change her diet and would like to meet Lupita LeashDonna to set up a diet plan  Plan -Referred to Ms Lupita LeashDonna -Prescribed Orlistat 120 mg TID. Explained to take that at least 4 hours apart from the Synthroid.  -Check TSH, and repeat TSH in 3 months due to pt being on orlistat -Patient will follow up in 1 month to assess how she is doing on this medication

## 2016-06-29 NOTE — Patient Instructions (Signed)
Thank you for your visit today Please take the trazodone at night time. Please take the orlistat three times a day with meals. Please follow the instructions on the bottle. DO not take if you do not eat . I explained to you some of the side effects. Please follow up with Ms. Lupita LeashDonna  Please follow up in 1 month to reassess your depression and your orlistat medication use

## 2016-06-30 LAB — TSH: TSH: 2.17 u[IU]/mL (ref 0.450–4.500)

## 2016-06-30 NOTE — Progress Notes (Signed)
Internal Medicine Clinic Attending  Case discussed with Dr. Saraiya soon after the resident saw the patient.  We reviewed the resident's history and exam and pertinent patient test results.  I agree with the assessment, diagnosis, and plan of care documented in the resident's note.  

## 2016-07-06 ENCOUNTER — Encounter: Payer: Self-pay | Admitting: Internal Medicine

## 2016-07-06 ENCOUNTER — Telehealth: Payer: Self-pay | Admitting: *Deleted

## 2016-07-23 ENCOUNTER — Ambulatory Visit: Payer: Self-pay | Admitting: Dietician

## 2016-07-23 NOTE — Telephone Encounter (Signed)
reviewed

## 2016-07-31 ENCOUNTER — Telehealth: Payer: Self-pay | Admitting: Internal Medicine

## 2016-07-31 NOTE — Telephone Encounter (Signed)
AT. REMINDER CALL, LMTCB °

## 2016-08-03 ENCOUNTER — Encounter: Payer: Self-pay | Admitting: Internal Medicine

## 2016-08-03 ENCOUNTER — Ambulatory Visit (INDEPENDENT_AMBULATORY_CARE_PROVIDER_SITE_OTHER): Payer: Self-pay | Admitting: Internal Medicine

## 2016-08-03 VITALS — BP 129/81 | Ht 61.0 in | Wt 229.5 lb

## 2016-08-03 DIAGNOSIS — F32A Depression, unspecified: Secondary | ICD-10-CM

## 2016-08-03 DIAGNOSIS — F329 Major depressive disorder, single episode, unspecified: Secondary | ICD-10-CM

## 2016-08-03 DIAGNOSIS — Z23 Encounter for immunization: Secondary | ICD-10-CM

## 2016-08-03 DIAGNOSIS — Z0000 Encounter for general adult medical examination without abnormal findings: Secondary | ICD-10-CM

## 2016-08-03 DIAGNOSIS — Z6841 Body Mass Index (BMI) 40.0 and over, adult: Secondary | ICD-10-CM

## 2016-08-03 DIAGNOSIS — Z021 Encounter for pre-employment examination: Secondary | ICD-10-CM

## 2016-08-03 DIAGNOSIS — I10 Essential (primary) hypertension: Secondary | ICD-10-CM

## 2016-08-03 MED ORDER — CITALOPRAM HYDROBROMIDE 20 MG PO TABS: 20.0000 mg | ORAL_TABLET | Freq: Every day | ORAL | 2 refills | Status: DC

## 2016-08-03 MED FILL — CITALOPRAM HBR 20 MG TABLET: 20 | 30 days supply | Qty: 30 | Fill #0

## 2016-08-03 NOTE — Patient Instructions (Signed)
  Thank you for your visit today  Please start taking celexa daily and stop the trazodone .  Please start the Orlistat- remember to take at least 4 hours apart from your thyroid medicine  I will let you know about the labs we have drawn today for your new job    Please follow up in 6 weeks

## 2016-08-03 NOTE — Assessment & Plan Note (Signed)
Pt has still not started taking Orlistat and will start this week  -start Orlistat , and explained to take at least 4 hours apart from the synthroid -I re-explained the side effects  -She will follow up in 4-6 weeks

## 2016-08-03 NOTE — Assessment & Plan Note (Signed)
Pt says she has had ongoing depression for last 6 months. However, when last seen, her main complaint was insomnia related to the depression, not the depression itself- so we had started trazodone 150 mg daily. Pt says that she tried to take trazodone but it made her feel very sleepy during the daytime also and so she has stopped taking that for few months.  -Her PHQ 9 score today was 16 indicating moderate depression. Says she is stressed about her children one 52 years old and one 52 years old with their life. So she has also gained few pounds, and does not feel interested in doing ehr activities. She did apply to become phlebotomist.   -Start celexa daily -follow in 4-6 weeks to assess her response

## 2016-08-03 NOTE — Assessment & Plan Note (Signed)
Pt's blood pressure is at goal today and she is compliant with her amlodipine 10 mg daily  -continue amlodipine 10 mg daily

## 2016-08-03 NOTE — Progress Notes (Signed)
   CC:  Depression, HTN, obesity, HM,  HPI: ShannonShannon Newton is a 50 y.o. woman with PMH noted below here for depression, HTN, obesity, HM   Please see Problem List/A&P for the status of the patient's chronic medical problems   Past Medical History:  Diagnosis Date  . Abdominal pain, recurrent 2009   per CT of the abd/pelvis (01/26/2008) -  Diffuse inflammatory change of the descending and rectosigmoid colon.  Additional inflammatory changes within the terminal ileum raise concern for inflammatory bowel disease (Crohn's disease).  . Back pain, chronic 2001   per MRI in 2001 - disc protrusion L5-6, to the right  . Complication of anesthesia    hard to wake up-sob-possible sleep apnea  . Depression   . Hypertension   . Hypothyroidism 2003   [place] [facility] - showed increased 24 hour uptake and the findings were consistent with Grave's disease; patient is s/p RAI therapy (09/19/2002)  . Sickle cell trait (HCC)   . Urinary frequency     Review of Systems: Denies fevers, fatigue Denies cough,SOB Denies CP Denies n/v/d/abd pain Denies joint pains    Physical Exam: Vitals:   08/03/16 1422  BP: 129/81  Weight: 229 lb 8 oz (104.1 kg)  Height: 5\' 1"  (1.549 m)    General: A&O, in NAD, morbidly obese CV: RRR, normal s1, s2, no m/r/g, no carotid bruits appreciated Resp: equal and symmetric breath sounds, no wheezing or crackles  Abdomen: soft, nontender, nondistended, +BS Extremities: pulses intact b/l, no edema   Assessment & Plan:   See encounters tab for problem based medical decision making. Patient discussed with Dr. Rogelia BogaButcher

## 2016-08-03 NOTE — Assessment & Plan Note (Signed)
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

## 2016-08-04 LAB — HEPATITIS C ANTIBODY: Hep C Virus Ab: 0.1 s/co ratio (ref 0.0–0.9)

## 2016-08-04 LAB — MEASLES/MUMPS/RUBELLA IMMUNITY
MUMPS ABS, IGG: 136 AU/mL (ref >10.9)
RUBEOLA AB, IGG: >300 AU/mL (ref >29.9)
Rubella Antibodies, IGG: 28.2 index (ref >0.99)

## 2016-08-04 LAB — HEPATITIS B CORE ANTIBODY, TOTAL: HEP B C TOTAL AB: NEGATIVE

## 2016-08-04 LAB — VARICELLA ZOSTER ABS, IGG/IGM
Varicella IgM: 1.21 index — ABNORMAL HIGH (ref 0.00–0.90)
Varicella zoster IgG: 3195 index (ref >165)

## 2016-08-04 LAB — HEPATITIS B SURFACE ANTIGEN: Hepatitis B Surface Ag: NEGATIVE

## 2016-08-04 NOTE — Addendum Note (Signed)
Addended by: Neomia DearPOWERS, Maverick Patman E on: 08/04/2016 06:41 PM   Modules accepted: Orders

## 2016-08-04 NOTE — Progress Notes (Signed)
Internal Medicine Clinic Attending  Case discussed with Dr. Saraiya soon after the resident saw the patient.  We reviewed the resident's history and exam and pertinent patient test results.  I agree with the assessment, diagnosis, and plan of care documented in the resident's note.  

## 2016-08-06 ENCOUNTER — Encounter: Payer: Self-pay | Admitting: Internal Medicine

## 2016-08-06 LAB — QUANTIFERON IN TUBE
QFT TB AG MINUS NIL VALUE: 0.02 IU/mL
QUANTIFERON MITOGEN VALUE: >10 IU/mL
QUANTIFERON TB AG VALUE: 0.06 IU/mL
QUANTIFERON TB GOLD: NEGATIVE
Quantiferon Nil Value: 0.04 IU/mL

## 2016-08-06 LAB — QUANTIFERON TB GOLD ASSAY (BLOOD)

## 2016-09-05 ENCOUNTER — Other Ambulatory Visit: Payer: Self-pay | Admitting: Internal Medicine

## 2016-09-05 DIAGNOSIS — M12811 Other specific arthropathies, not elsewhere classified, right shoulder: Secondary | ICD-10-CM

## 2016-09-07 NOTE — Telephone Encounter (Signed)
Message sent to front office an appt for pt.

## 2016-09-07 NOTE — Telephone Encounter (Signed)
Nothing on problem list to indicate med need. Med not on med list. Pt needs F/U PCP after starting new meds. pls sch PCP next 2 -4 weeks. If no PCP appt, then [facility] - F/U depression and Celexa

## 2016-09-21 ENCOUNTER — Ambulatory Visit (INDEPENDENT_AMBULATORY_CARE_PROVIDER_SITE_OTHER): Payer: Self-pay | Admitting: Internal Medicine

## 2016-09-21 ENCOUNTER — Encounter: Payer: Self-pay | Admitting: Internal Medicine

## 2016-09-21 VITALS — BP 139/87 | HR 69 | Temp 97.9°F | Ht 61.0 in | Wt 226.1 lb

## 2016-09-21 DIAGNOSIS — F329 Major depressive disorder, single episode, unspecified: Secondary | ICD-10-CM

## 2016-09-21 DIAGNOSIS — Z6841 Body Mass Index (BMI) 40.0 and over, adult: Secondary | ICD-10-CM

## 2016-09-21 DIAGNOSIS — G4700 Insomnia, unspecified: Secondary | ICD-10-CM

## 2016-09-21 DIAGNOSIS — F3341 Major depressive disorder, recurrent, in partial remission: Secondary | ICD-10-CM

## 2016-09-21 MED ORDER — CITALOPRAM HYDROBROMIDE 40 MG PO TABS: 40.0000 mg | ORAL_TABLET | Freq: Every day | ORAL | 2 refills | Status: DC

## 2016-09-21 NOTE — Progress Notes (Signed)
   CC: depression  HPI:  Shannon Newton is a 50 y.o. with a PMH of MDD, HTN, Hypothyroidism, urinary frequency presenting to clinic for f/u on depression after initiation of celexa 20mg . Patient state that since starting the medicine, she has been able to sleep through the night which was one of her worst symptoms. She state she feels like she has more energy during the day but still has room for improvement. She denies any SI/HI.   She has also been taking the orlistat daily about 4 hours after her synthroid. She states she notices a decrease in cravings but still has adequate appetite for meals. She states her meals are usually not a regular schedule.  She denies cold/hot intolerance, diarrhea and constipation, changes in skin and hair.   Please see problem based Assessment and Plan for status of patients chronic conditions.  Past Medical History:  Diagnosis Date  . Abdominal pain, recurrent 2009   per CT of the abd/pelvis (01/26/2008) -  Diffuse inflammatory change of the descending and rectosigmoid colon.  Additional inflammatory changes within the terminal ileum raise concern for inflammatory bowel disease (Crohn's disease).  . Back pain, chronic 2001   per MRI in 2001 - disc protrusion L5-6, to the right  . Complication of anesthesia    hard to wake up-sob-possible sleep apnea  . Depression   . Hypertension   . Hypothyroidism 2003   Thyroid US done in 2003 (results in [facility]) - showed increased 24 hour uptake and the findings were consistent with Grave's disease; patient is s/p RAI therapy (09/19/2002)  . Sickle cell trait (HCC)   . Urinary frequency     Review of Systems:   Review of Systems  Constitutional: Positive for weight loss (intentional). Negative for chills and fever.  Respiratory: Negative for cough.   Cardiovascular: Negative for chest pain and palpitations.  Gastrointestinal: Negative for abdominal pain, blood in stool, constipation, diarrhea, heartburn,  melena, nausea and vomiting.  Genitourinary: Positive for frequency (chronic).  Neurological: Negative for headaches.  Psychiatric/Behavioral: Positive for depression. Negative for substance abuse and suicidal ideas. The patient does not have insomnia.     Physical Exam:  Vitals:   09/21/16 1437  BP: 139/87  Pulse: 69  Temp: 97.9 F (36.6 C)  TempSrc: Oral  SpO2: 98%  Weight: 226 lb 1.6 oz (102.6 kg)  Height: 5\' 1"  (1.549 m)   Physical Exam Constitutional: NAD, pleasant CV: RRR, no murmurs, rubs or gallops appreciated Resp: CTAB, no crackles or wheezing appreciated, no increased work of breathing Abd: soft, obese, +BS, nontender, nondistended Ext: warm, 2+ pulses throughout, no rash or lesions, no edema  Assessment & Plan:   See Encounters Tab for problem based charting.   Patient seen with Dr. Valla LeaverButcher   Schon Zeiders, MD Internal Medicine PGY1

## 2016-09-21 NOTE — Patient Instructions (Signed)
We increased your celexa to 40mg  daily.   Keep doing a good job with your diet and increasing your activity!  Good luck on your internship!!

## 2016-09-23 NOTE — Assessment & Plan Note (Signed)
Patient started on Celexa 20mg  daily on 08/03/2016 with improvement of insomnia symptoms, energy and mood; patient feels that she might have still some room for energy and mood improvement. PSQ9 today was 5.   Plan: --increase to celexa 40mg  daily, re-evaluate in 4-6 weeks.

## 2016-09-23 NOTE — Assessment & Plan Note (Signed)
Patient taking orlistat daily; taking 4 hours apart from synthroid. She has lost 3 lbs since last visit in September. She notices decrease in snack cravings.   Plan: --continue orlistat --educated patient on increase in activity and healthy food choices --educated patient on scheduling her meals, and having regular meals rather than one big meal --offered her the option of meeting with dietician in office; declined for now.

## 2016-09-23 NOTE — Progress Notes (Signed)
Internal Medicine Clinic Attending  I saw and evaluated the patient.  I personally confirmed the key portions of the history and exam documented by Dr. Svalina and I reviewed pertinent patient test results.  The assessment, diagnosis, and plan were formulated together and I agree with the documentation in the resident's note.  

## 2016-09-24 IMAGING — CT CT HEAD W/O CM
3 of 7 series · 13 of 47 positions shown, 15 images · non-contrast
Comparison: Head CT May 09, 2008

ADDENDUM:
CERVICAL SPINE CT
CLINICAL DATA: Headache with syncope.  Suspected fall.

EXAM:
CT HEAD WITHOUT CONTRAST
CT CERVICAL SPINE WITHOUT CONTRAST
TECHNIQUE: Multidetector CT imaging of the head and cervical spine was
performed following the standard protocol without intravenous
contrast. Multiplanar CT image reconstructions of the cervical spine
were also generated.

[Series 302: soft tissue, idose (2) · axial · 0.47mm/px · z∈[+36,+198]mm · 8 of 99 slices shown, 10 images]
[im 12/99  brain]
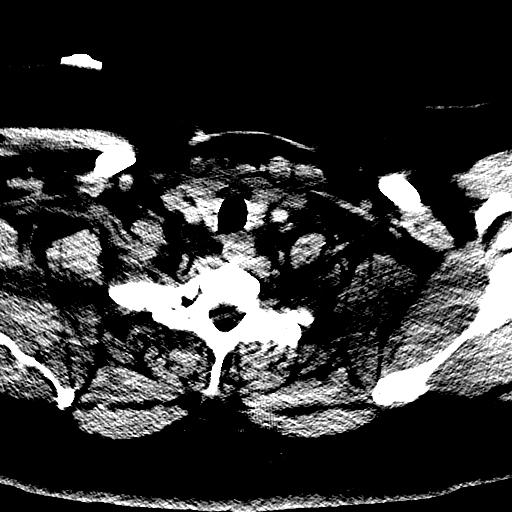
[im 12/99  bone]
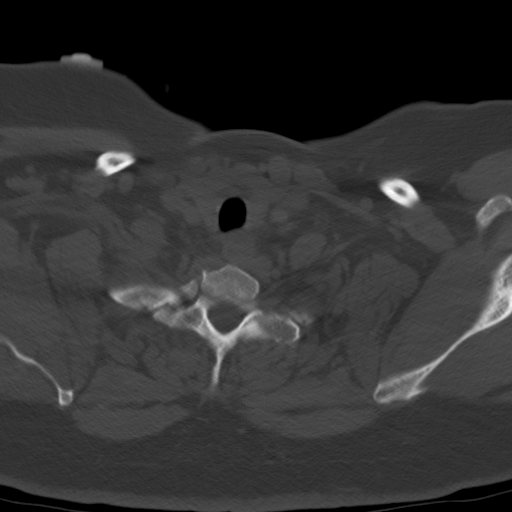
[im 24/99  brain]
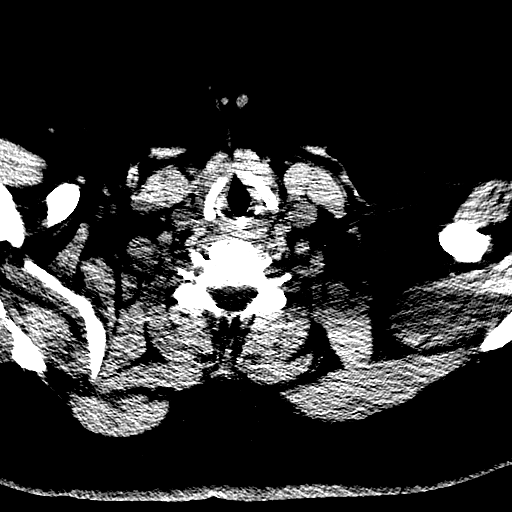
[im 35/99  brain]
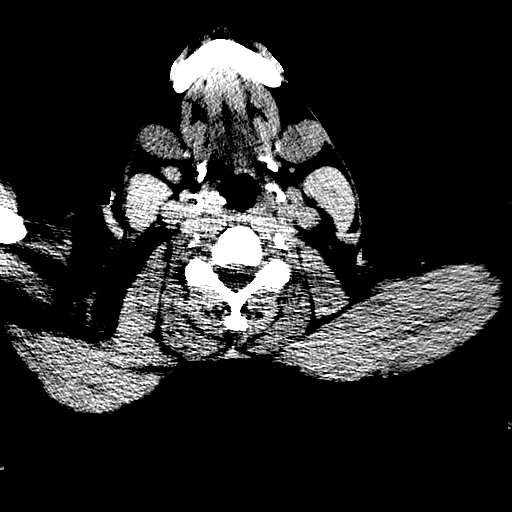
[im 47/99  brain]
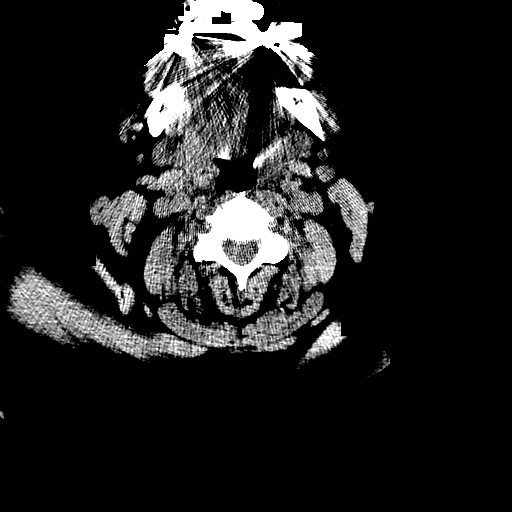
[im 58/99  brain]
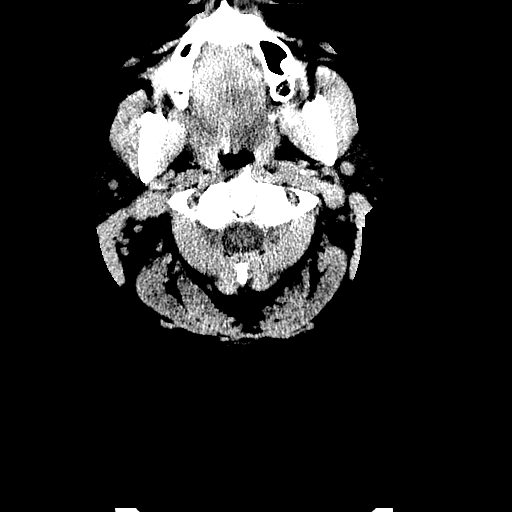
[im 58/99  bone]
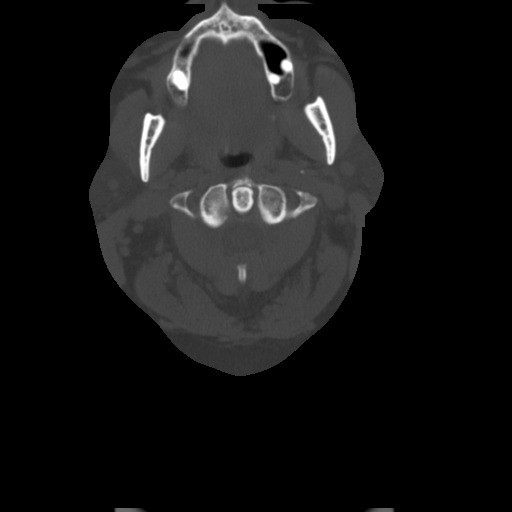
[im 70/99  brain]
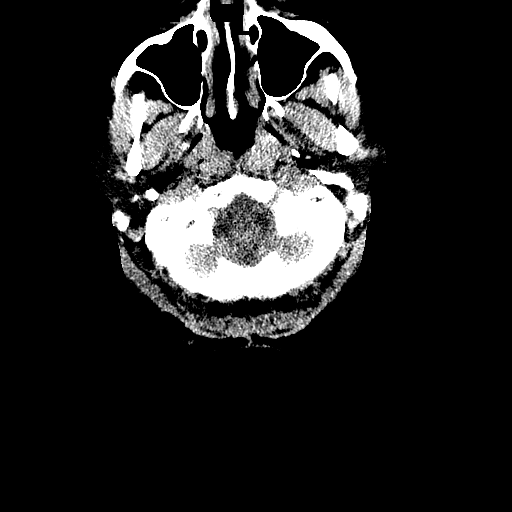
[im 81/99  brain]
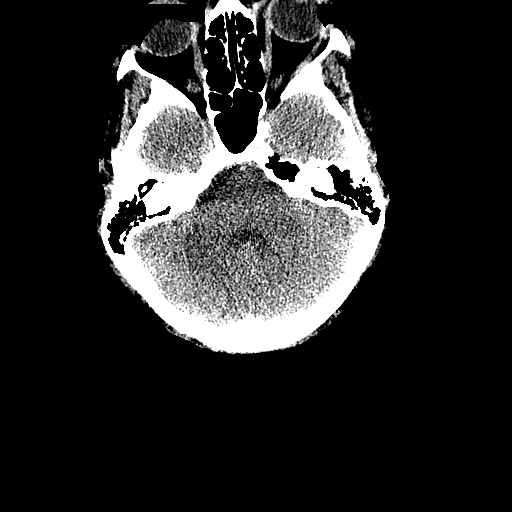
[im 93/99  brain]
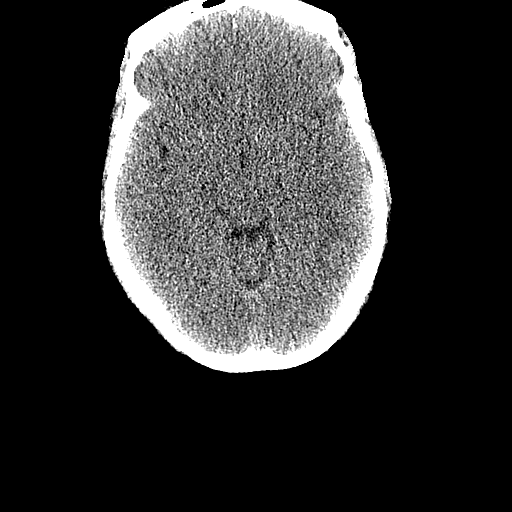

[Series 304: coronal, idose (2) · coronal · 0.34mm/px · 3 of 120 slices shown]
[im 24/120  brain]
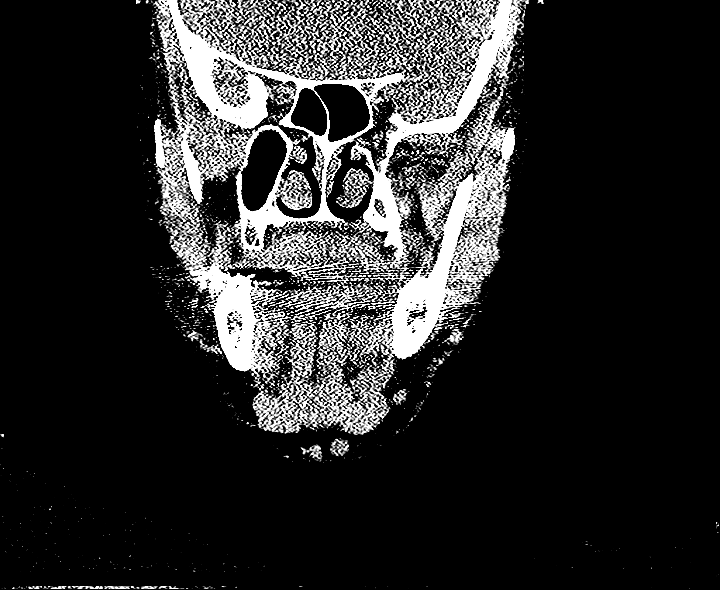
[im 48/120  brain]
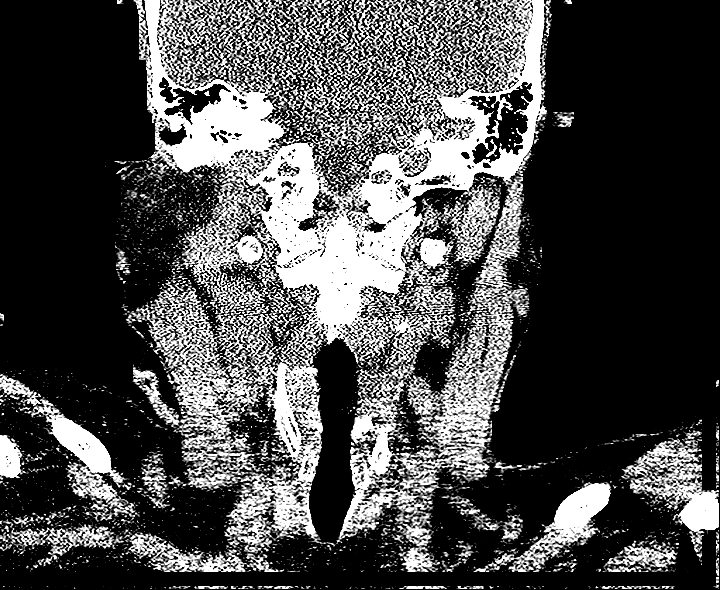
[im 72/120  brain]
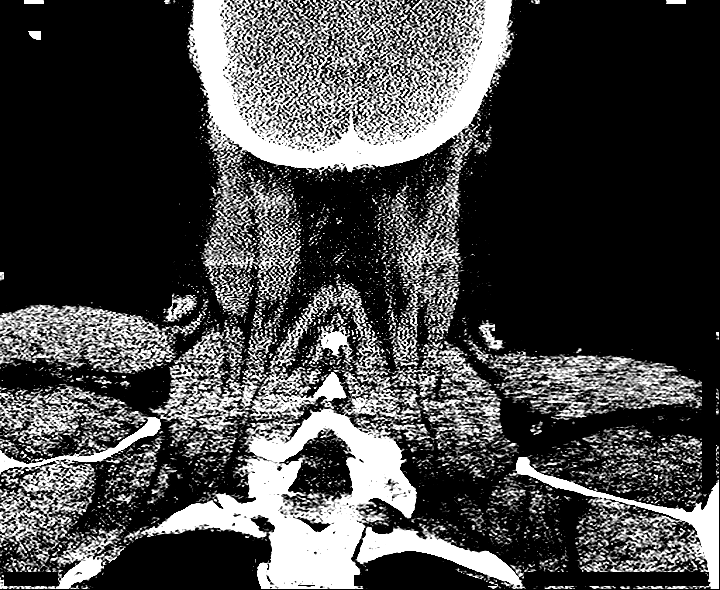

[Series 305: sagittal, idose (2) · sagittal · 0.34mm/px · 2 of 120 slices shown]
[im 40/120  brain]
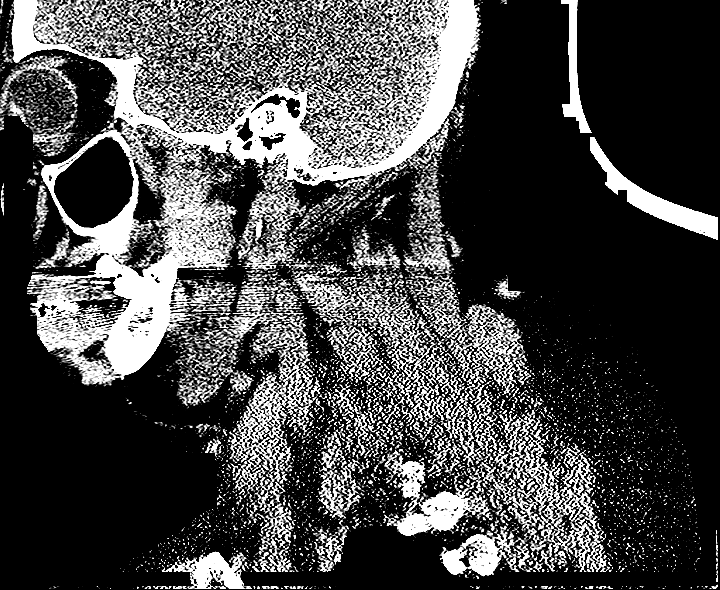
[im 80/120  brain]
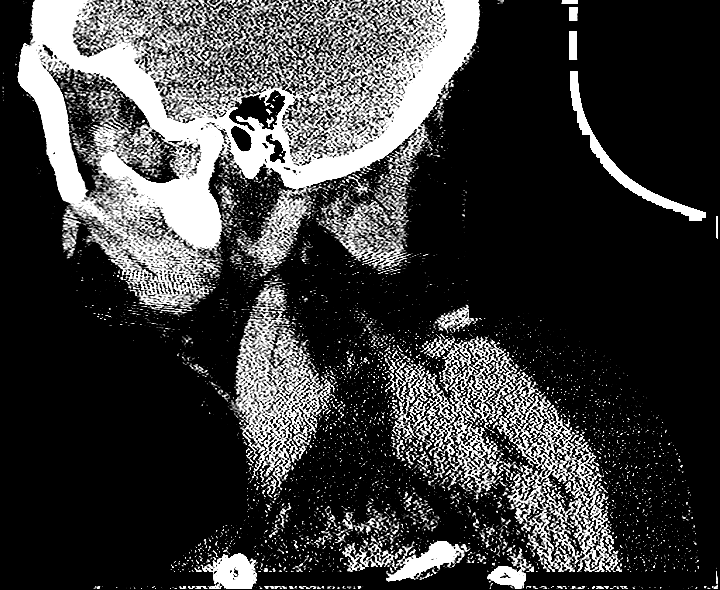

[13 of 47 positions shown; findings below may reference images not displayed]

FINDINGS: There is no fracture or spondylolisthesis. Prevertebral soft tissues
and predental space regions are normal. There is no appreciable disc
space narrowing. There is an anterior osteophyte along the inferior
aspect of the C5 vertebral body. There is calcification in the
anterior longitudinal ligament at C5-6. There is slight facet
hypertrophy at several levels bilaterally. There is no appreciable
nerve root edema or effacement. No disc extrusion or stenosis
evident. There is a small focus of calcification in the left carotid
artery.
IMPRESSION: Mild osteoarthritic change. No fracture or spondylolisthesis small
focus of calcification in the left carotid artery.
FINDINGS: CT HEAD FINDINGS

The ventricles are normal in size and configuration. There is no
intracranial mass, hemorrhage, extra-axial fluid collection, or
midline shift. Gray-white compartments are normal. No acute infarct
is evident. There is calcification in the cavernous carotid arteries
bilaterally, more on the left than on the right. The bony calvarium
appears intact. The mastoid air cells are clear. Orbits appear
symmetric and normal bilaterally. Visualized paranasal sinuses are
clear.

CT CERVICAL SPINE FINDINGS
IMPRESSION: CT head: No intracranial mass, hemorrhage, or extra-axial fluid
collection. Gray-white compartments appear normal. Areas of vascular
calcification.

## 2016-10-21 ENCOUNTER — Ambulatory Visit: Payer: Self-pay

## 2016-11-24 ENCOUNTER — Other Ambulatory Visit: Payer: Self-pay | Admitting: Internal Medicine

## 2016-11-24 DIAGNOSIS — E039 Hypothyroidism, unspecified: Secondary | ICD-10-CM

## 2017-02-09 ENCOUNTER — Ambulatory Visit (INDEPENDENT_AMBULATORY_CARE_PROVIDER_SITE_OTHER): Payer: Self-pay | Admitting: Pulmonary Disease

## 2017-02-09 VITALS — BP 149/85 | HR 88 | Temp 98.2°F | Ht 61.0 in | Wt 226.3 lb

## 2017-02-09 DIAGNOSIS — B9689 Other specified bacterial agents as the cause of diseases classified elsewhere: Secondary | ICD-10-CM

## 2017-02-09 DIAGNOSIS — J019 Acute sinusitis, unspecified: Secondary | ICD-10-CM | POA: Insufficient documentation

## 2017-02-09 DIAGNOSIS — Z79899 Other long term (current) drug therapy: Secondary | ICD-10-CM

## 2017-02-09 DIAGNOSIS — I10 Essential (primary) hypertension: Secondary | ICD-10-CM

## 2017-02-09 DIAGNOSIS — E039 Hypothyroidism, unspecified: Secondary | ICD-10-CM

## 2017-02-09 DIAGNOSIS — J0100 Acute maxillary sinusitis, unspecified: Secondary | ICD-10-CM

## 2017-02-09 MED ORDER — AMLODIPINE BESYLATE 10 MG PO TABS: 10.0000 mg | ORAL_TABLET | Freq: Every day | ORAL | 5 refills | Status: DC

## 2017-02-09 MED ORDER — DOXYCYCLINE HYCLATE 100 MG PO CAPS: 100.0000 mg | ORAL_CAPSULE | Freq: Two times a day (BID) | ORAL | 0 refills | Status: DC

## 2017-02-09 MED ORDER — PRAVASTATIN SODIUM 40 MG PO TABS: 40.0000 mg | ORAL_TABLET | Freq: Every evening | ORAL | 11 refills | Status: DC

## 2017-02-09 MED ORDER — LEVOTHYROXINE SODIUM 88 MCG PO TABS: ORAL_TABLET | ORAL | 3 refills | Status: DC

## 2017-02-09 NOTE — Progress Notes (Signed)
Internal Medicine Clinic Attending  Case discussed with Dr. Krall at the time of the visit.  We reviewed the resident's history and exam and pertinent patient test results.  I agree with the assessment, diagnosis, and plan of care documented in the resident's note.  

## 2017-02-09 NOTE — Patient Instructions (Signed)
Take your antibiotic twice a day for 5 days Keep using your Flonase daily   Sinusitis, Adult Sinusitis is soreness and inflammation of your sinuses. Sinuses are hollow spaces in the bones around your face. They are located:  Around your eyes.  In the middle of your forehead.  Behind your nose.  In your cheekbones. Your sinuses and nasal passages are lined with a stringy fluid (mucus). Mucus normally drains out of your sinuses. When your nasal tissues get inflamed or swollen, the mucus can get trapped or blocked so air cannot flow through your sinuses. This lets bacteria, viruses, and funguses grow, and that leads to infection. Follow these instructions at home: Medicines   Take, use, or apply over-the-counter and prescription medicines only as told by your doctor. These may include nasal sprays.  If you were prescribed an antibiotic medicine, take it as told by your doctor. Do not stop taking the antibiotic even if you start to feel better. Hydrate and Humidify   Drink enough water to keep your pee (urine) clear or pale yellow.  Use a cool mist humidifier to keep the humidity level in your home above 50%.  Breathe in steam for 10-15 minutes, 3-4 times a day or as told by your doctor. You can do this in the bathroom while a hot shower is running.  Try not to spend time in cool or dry air. Rest   Rest as much as possible.  Sleep with your head raised (elevated).  Make sure to get enough sleep each night. General instructions   Put a warm, moist washcloth on your face 3-4 times a day or as told by your doctor. This will help with discomfort.  Wash your hands often with soap and water. If there is no soap and water, use hand sanitizer.  Do not smoke. Avoid being around people who are smoking (secondhand smoke).  Keep all follow-up visits as told by your doctor. This is important. Contact a doctor if:  You have a fever.  Your symptoms get worse.  Your symptoms do not  get better within 10 days. Get help right away if:  You have a very bad headache.  You cannot stop throwing up (vomiting).  You have pain or swelling around your face or eyes.  You have trouble seeing.  You feel confused.  Your neck is stiff.  You have trouble breathing. This information is not intended to replace advice given to you by your health care provider. Make sure you discuss any questions you have with your health care provider. Document Released: 05/18/2008 Document Revised: 07/26/2016 Document Reviewed: 09/25/2015 Elsevier Interactive Patient Education  2017 [employer]

## 2017-02-09 NOTE — Assessment & Plan Note (Signed)
Assessment: Sinus pain, pressure, cough, rhinorrhea, sneezing for the last 3 weeks.   Plan: Doxycycline 100mg  BID for 5 days Continue Flonase daily Continue supportive care

## 2017-02-09 NOTE — Progress Notes (Signed)
   CC: sinus problems  HPI:  Shannon Newton is a 51 y.o. woman with history as noted below here with sinus problems.  Her head hurts. Her throat hurts. She has coughing and sneezing. Cough is productive of yellow-green mucous. Her ears are ringing. She has congestion in her head. She took Theraflu which seemed to make it better. Has been ongoing for 3 weeks. Seemed to go away but is now back and worse. She uses her flonase. She is around her grandchildren a lot - they have hadr respiratory symptoms. She has had the flu shot this year.   Past Medical History:  Diagnosis Date  . Abdominal pain, recurrent 2009   per CT of the abd/pelvis (01/26/2008) -  Diffuse inflammatory change of the descending and rectosigmoid colon.  Additional inflammatory changes within the terminal ileum raise concern for inflammatory bowel disease (Crohn's disease).  . Back pain, chronic 2001   per MRI in 2001 - disc protrusion L5-6, to the right  . Complication of anesthesia    hard to wake up-sob-possible sleep apnea  . Depression   . Hypertension   . Hypothyroidism 2003   Thyroid US done in 2003 ([facility]) - showed increased 24 hour uptake and the findings were consistent with Grave's disease; patient is s/p RAI therapy (09/19/2002)  . Sickle cell trait (HCC)   . Urinary frequency     Review of Systems:   No chest pain No dyspnea  Physical Exam:  Vitals:   02/09/17 0928 02/09/17 0930  BP: (!) 149/85   Pulse: 88   Temp:  98.2 F (36.8 C)  TempSrc:  Oral  SpO2: 100%   Weight: 226 lb 4.8 oz (102.6 kg)   Height: 5\' 1"  (1.549 m)    General Apperance: NAD HEENT: Normocephalic, atraumatic, anicteric sclera, tender to palpation over maxillary sinuses, mild erythema of right tympanic membrane Neck: Supple, trachea midline Lungs: Clear to auscultation bilaterally. No wheezes, rhonchi or rales. Breathing comfortably Heart: Regular rate and rhythm, no murmur/rub/gallop Abdomen: Soft, nontender,  nondistended, no rebound/guarding Extremities: Warm and well perfused, no edema Skin: No rashes or lesions Neurologic: Alert and interactive. No gross deficits.  Assessment & Plan:   See Encounters Tab for problem based charting.  Patient discussed with Dr. Rogelia BogaButcher

## 2017-02-09 NOTE — Assessment & Plan Note (Signed)
TSH last checked in July. Euthyroid today. Refilled levothyroxine.

## 2017-02-09 NOTE — Assessment & Plan Note (Addendum)
BP 149/85, above goal. Refill amlodipine 10mg  daily. Follow up in 6 weeks

## 2017-04-15 ENCOUNTER — Telehealth: Payer: Self-pay | Admitting: Internal Medicine

## 2017-04-15 NOTE — Telephone Encounter (Signed)
APT. REMINDER CALL, LMTCB °

## 2017-04-16 ENCOUNTER — Ambulatory Visit (INDEPENDENT_AMBULATORY_CARE_PROVIDER_SITE_OTHER): Payer: Self-pay | Admitting: Internal Medicine

## 2017-04-16 ENCOUNTER — Encounter: Payer: Self-pay | Admitting: Internal Medicine

## 2017-04-16 ENCOUNTER — Ambulatory Visit (HOSPITAL_COMMUNITY)
Admission: RE | Admit: 2017-04-16 | Discharge: 2017-04-16 | Disposition: A | Discharge status: Home / Self Care | Payer: Self-pay | Source: Ambulatory Visit | Attending: Oncology | Admitting: Oncology

## 2017-04-16 DIAGNOSIS — M25572 Pain in left ankle and joints of left foot: Secondary | ICD-10-CM

## 2017-04-16 DIAGNOSIS — M7989 Other specified soft tissue disorders: Secondary | ICD-10-CM

## 2017-04-16 DIAGNOSIS — I10 Essential (primary) hypertension: Secondary | ICD-10-CM

## 2017-04-16 DIAGNOSIS — M79672 Pain in left foot: Secondary | ICD-10-CM | POA: Insufficient documentation

## 2017-04-16 DIAGNOSIS — Z79899 Other long term (current) drug therapy: Secondary | ICD-10-CM

## 2017-04-16 DIAGNOSIS — M7732 Calcaneal spur, left foot: Secondary | ICD-10-CM | POA: Insufficient documentation

## 2017-04-16 NOTE — Assessment & Plan Note (Addendum)
Patient is presenting today with 3 weeks of pain and swelling in her left ankle. Patient does not remember any specific incident or injury to the foot, however reports her bed is very high and requires a stepstool to get in bed and out of bed. She reports she will sometimes jump off the bed without using the step stool and believes she may have injured her foot this way. On exam, she has localized swelling over her lateral malleolus. There is tenderness to palpation over the area but ROM is intact. Patient has been taking tylenol daily for pain. She has been using rest, ice, compression, and elevation regularly at home. Despite this, her swelling has persisted.  -- F/u left ankle xray -- Patient has allergy to ibuprofen (hives and swelling), continue tylenol for pain  -- Continue rest, ice, compression, and elevation  -- Patient has ankle brace at home, encouraged her to use it -- F/u in 1 week if not improved; patient may require drainage of her effusion. Consider crystal arthropathy, although swelling seems more superficial on exam. Likely an inflamed bursa.   ADDENDUM: Xray with no evidence of fracture, dislocation, or joint effusion. Called patient with results, left voicemail. Instructed her to follow up in 1-2 weeks if swelling and pain persists.

## 2017-04-16 NOTE — Assessment & Plan Note (Signed)
Blood pressure is uncontrolled today, 156/88. She reports she forgot to take her blood pressure medication today. Encouraged compliance. -- Continue amlodipine 10 mg daily

## 2017-04-16 NOTE — Patient Instructions (Signed)
Ms. Shannon Newton,  It was a pleasure to see you today. For your foot pain, please go to the first floor radiology department to have your x-rays done. I will call you with the results. If your pain and swelling worsens or persists, please return to clinic for further evaluation. If you have any questions or concerns, call our clinic at [phone] or after hours call [phone] and ask for the internal medicine resident on call. Thank you!  - Dr. Antony ContrasGuilloud

## 2017-04-16 NOTE — Progress Notes (Signed)
   CC: Left foot pain and swelling   HPI:  [name] is a 51 y.o. female with past medical history outlined below here for left foot pain and swelling. For the details of today's visit, please refer to the assessment and plan.  Past Medical History:  Diagnosis Date  . Abdominal pain, recurrent 2009   per CT of the abd/pelvis (01/26/2008) -  Diffuse inflammatory change of the descending and rectosigmoid colon.  Additional inflammatory changes within the terminal ileum raise concern for inflammatory bowel disease (Crohn's disease).  . Back pain, chronic 2001   per MRI in 2001 - disc protrusion L5-6, to the right  . Complication of anesthesia    hard to wake up-sob-possible sleep apnea  . Depression   . Hypertension   . Hypothyroidism 2003   Thy[facility] done in 2003 ([facility] - showed increased 24 hour uptake and the findings were consistent with Grave's disease; patient is s/p RAI therapy (09/19/2002)  . Sickle cell trait (HCC)   . Urinary frequency     Review of Systems:  All pertinents listed in HPI, otherwise negative  Physical Exam:  Vitals:   04/16/17 1327  BP: (!) 156/88  Pulse: 76  Temp: 98 F (36.7 C)  TempSrc: Oral  SpO2: 100%  Weight: 230 lb 6.4 oz (104.5 kg)  Height: 5' 1.5" (1.562 m)    Constitutional: NAD, appears comfortable Cardiovascular: RRR, no murmurs, rubs, or gallops.  Pulmonary/Chest: CTAB, no wheezes, rales, or rhonchi.  Abdominal: Soft, non tender, non distended. +BS.  Extremities: Warm and well perfused. Left foot with localized swelling over her lateral malleolus, tender to palpation. ROM intact. Distal pulses intact.  Neurological: A&Ox3, CN II - XII grossly intact.   Assessment & Plan:   See Encounters Tab for problem based charting.  Patient discussed with Dr. Criselda PeachesMullen

## 2017-04-18 NOTE — Progress Notes (Signed)
Internal Medicine Clinic Attending  I saw and evaluated the patient.  I personally confirmed the key portions of the history and exam documented by Dr. Antony ContrasGuilloud and I reviewed pertinent patient test results.  The assessment, diagnosis, and plan were formulated together and I agree with the documentation in the resident's note.  Evaluated patient with Dr. Antony ContrasGuilloud.  Swelling is very localized to the lateral left ankle.  There is no swelling in the joint space and no limitation of movement.  The swollen area is fluctuant, mildly tender, not warm to the touch or erythematous.  I think this is likely due to an acute on chronic ligamentous strain or bursa inflammation.  She is allergic to an NSAID, but this would be the appropriate therapy.  If not improving in 1 week, will get ultrasound and attempt to drain.

## 2017-07-25 IMAGING — DX DG ANKLE COMPLETE 3+V*L*
3 series · 3 of 3 positions shown · non-contrast
Comparison: 05/07/2010

CLINICAL DATA: Lateral ankle pain and swelling for about 1 month
now, gradually getting worse - no known injury

EXAM:
LEFT ANKLE COMPLETE - 3+ VIEW

[x ankle ap left]
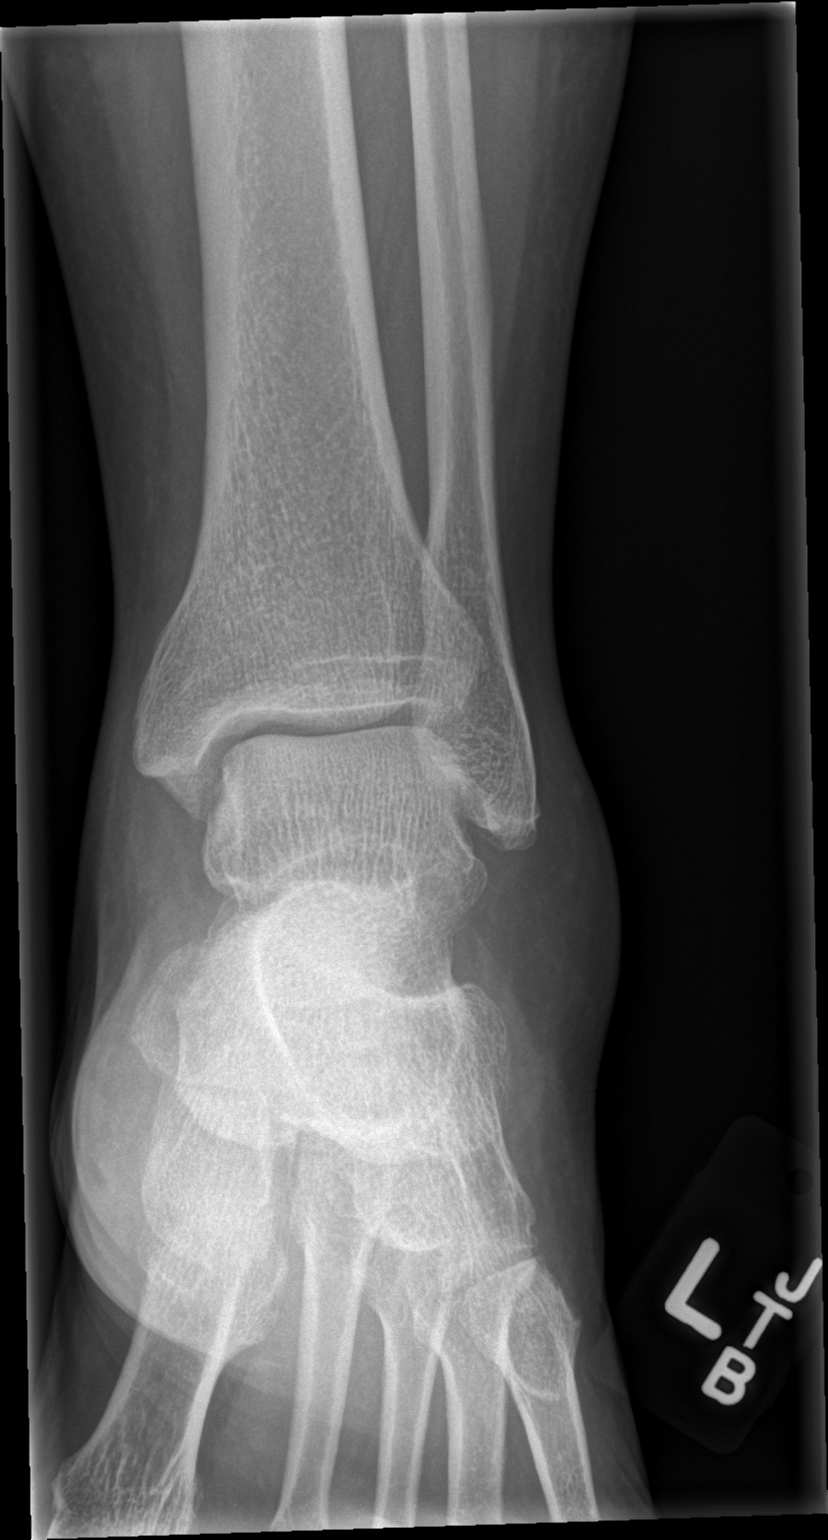

[x ankle obl left]
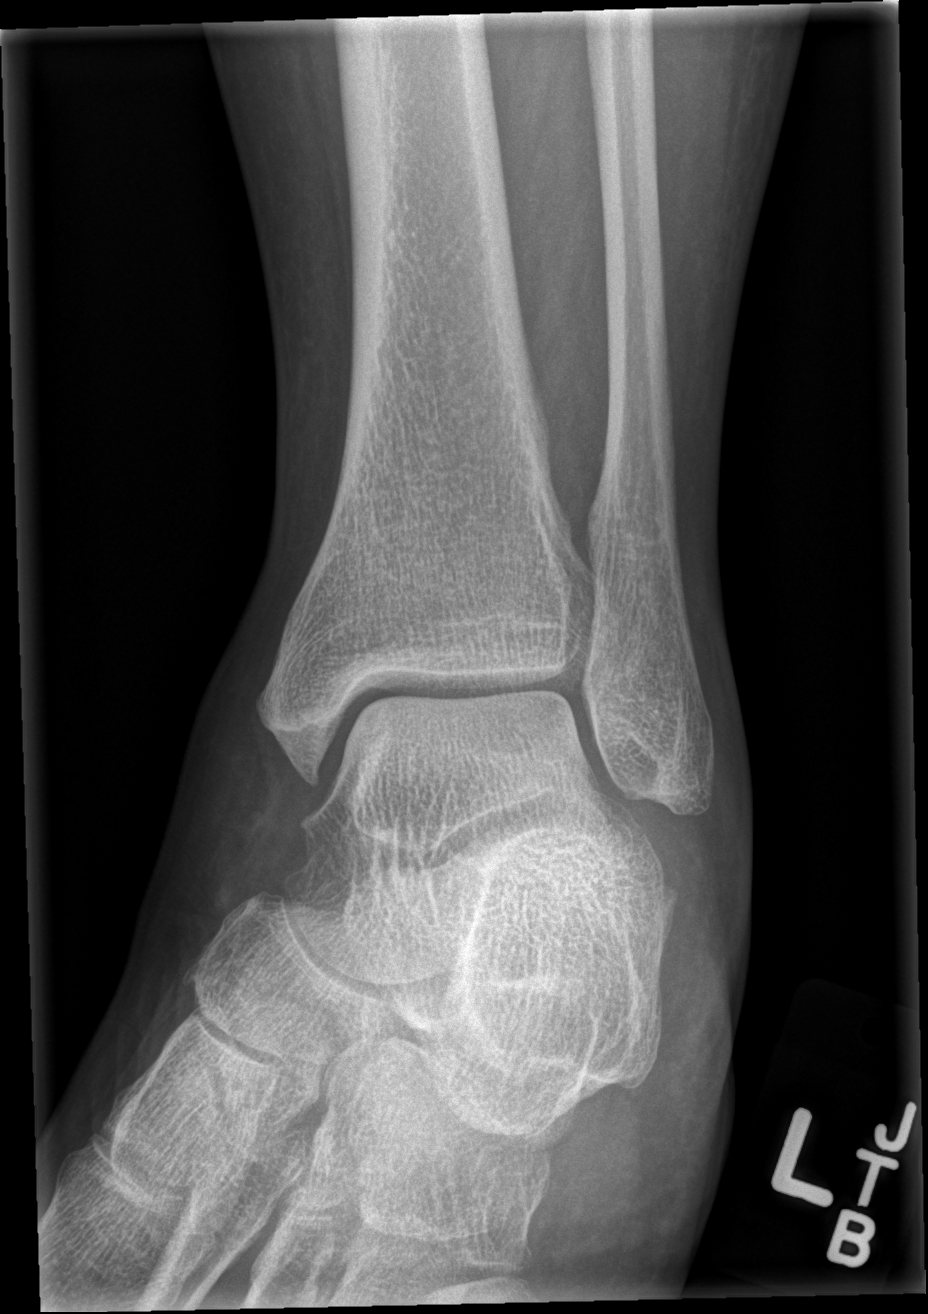

[x ankle lat left]
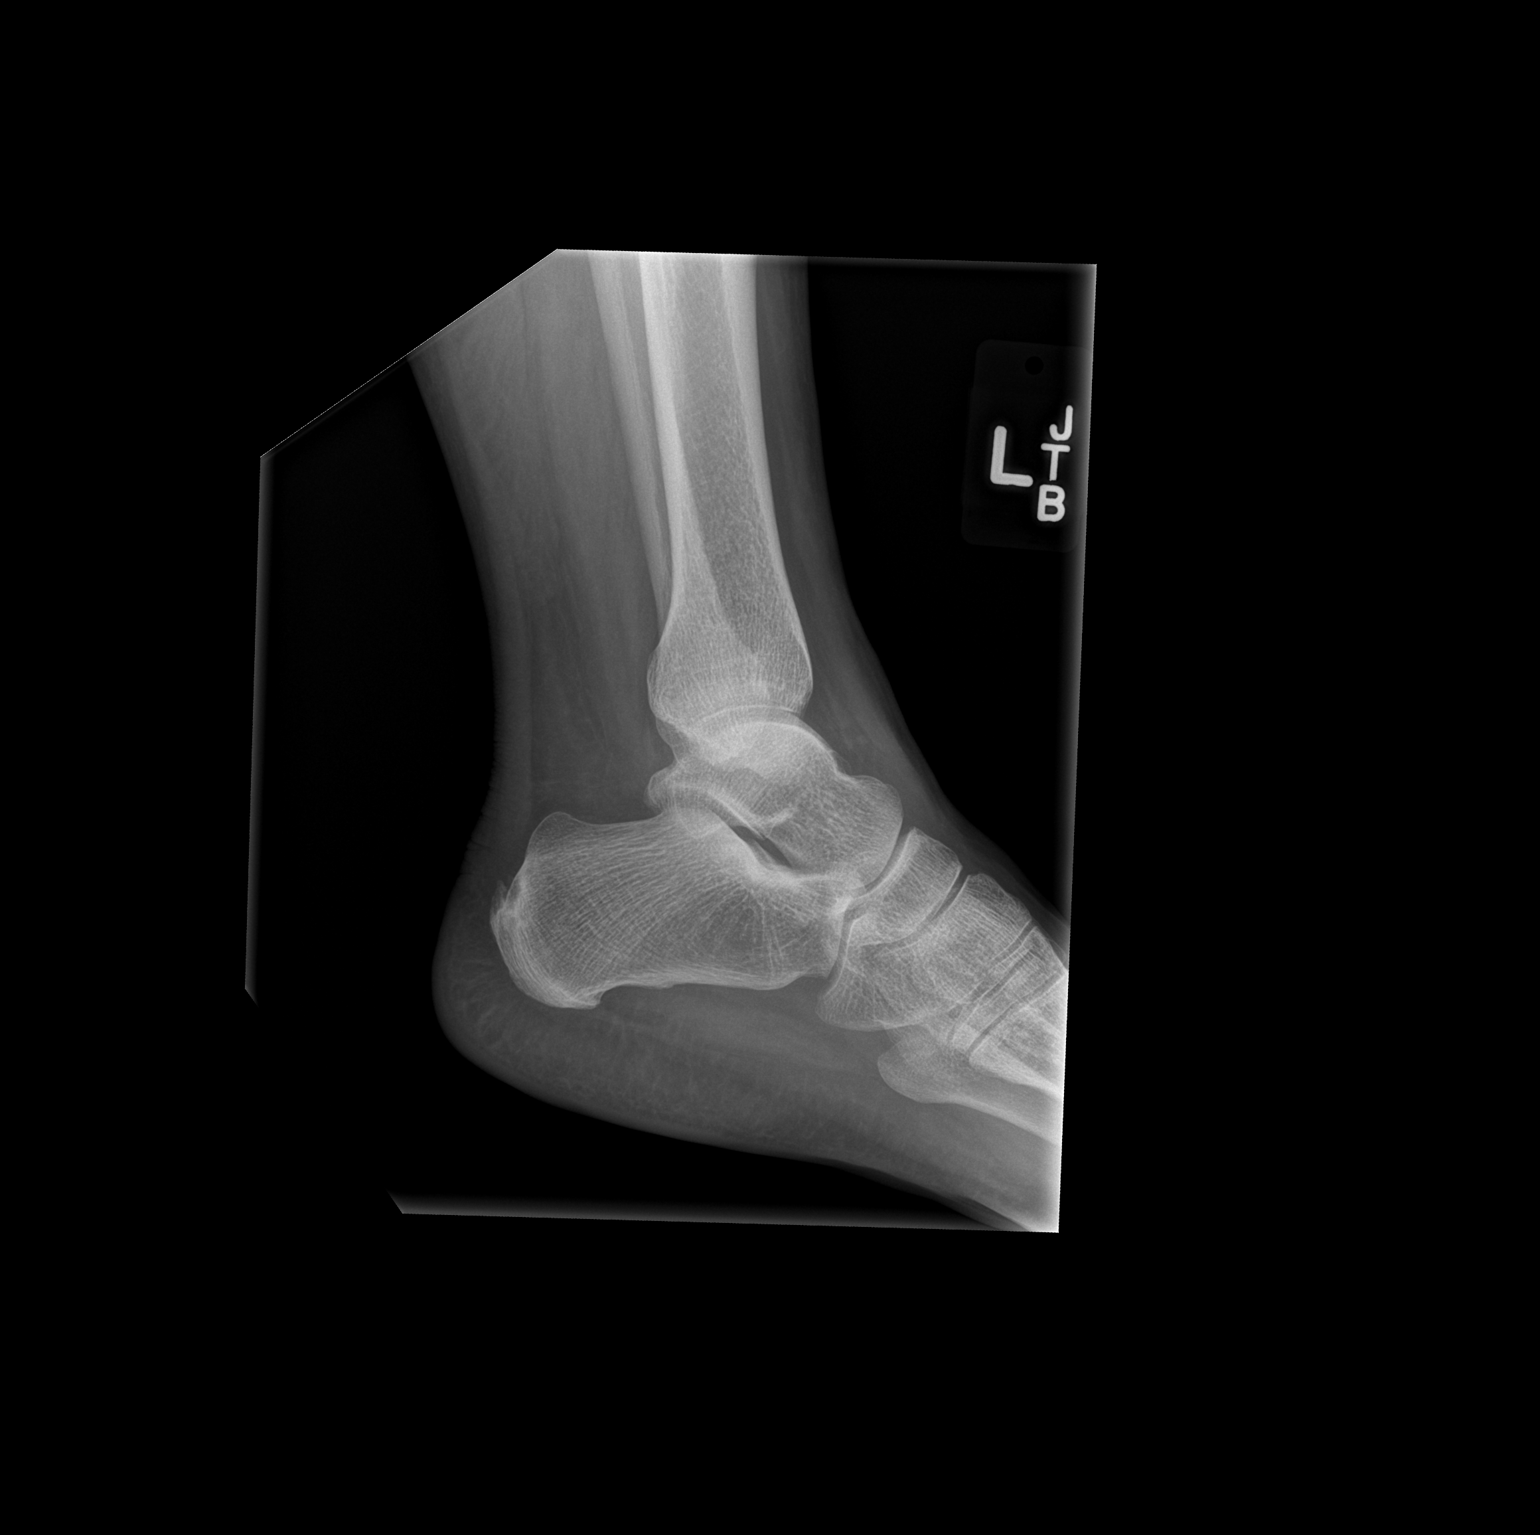

[3 of 3 positions shown; findings below may reference images not displayed]

FINDINGS: There is no evidence of fracture, dislocation, or joint effusion.
Small calcaneal spurs as before. There is no evidence of arthropathy
or other focal bone abnormality. Soft tissues are unremarkable.
IMPRESSION: Small calcaneal spurs.  Otherwise negative.

## 2017-09-29 ENCOUNTER — Ambulatory Visit: Payer: Self-pay

## 2017-09-30 ENCOUNTER — Ambulatory Visit (INDEPENDENT_AMBULATORY_CARE_PROVIDER_SITE_OTHER): Payer: Self-pay | Admitting: Internal Medicine

## 2017-09-30 ENCOUNTER — Encounter: Payer: Self-pay | Admitting: Internal Medicine

## 2017-09-30 VITALS — BP 135/76 | HR 74 | Temp 98.3°F | Ht 61.5 in | Wt 223.7 lb

## 2017-09-30 DIAGNOSIS — I10 Essential (primary) hypertension: Secondary | ICD-10-CM

## 2017-09-30 DIAGNOSIS — E010 Iodine-deficiency related diffuse (endemic) goiter: Secondary | ICD-10-CM

## 2017-09-30 DIAGNOSIS — H9203 Otalgia, bilateral: Secondary | ICD-10-CM

## 2017-09-30 DIAGNOSIS — R2689 Other abnormalities of gait and mobility: Secondary | ICD-10-CM

## 2017-09-30 DIAGNOSIS — H7292 Unspecified perforation of tympanic membrane, left ear: Secondary | ICD-10-CM

## 2017-09-30 DIAGNOSIS — D573 Sickle-cell trait: Secondary | ICD-10-CM

## 2017-09-30 DIAGNOSIS — E785 Hyperlipidemia, unspecified: Secondary | ICD-10-CM

## 2017-09-30 DIAGNOSIS — R000 Tachycardia, unspecified: Secondary | ICD-10-CM

## 2017-09-30 DIAGNOSIS — J069 Acute upper respiratory infection, unspecified: Secondary | ICD-10-CM

## 2017-09-30 DIAGNOSIS — R1084 Generalized abdominal pain: Secondary | ICD-10-CM

## 2017-09-30 NOTE — Assessment & Plan Note (Signed)
Sore throat and hoarseness, dry cough, nausea, headaches, muscle aches (especially shoulder), fever (100 degrees few days ago), chills for the past week. The patient states that the upper respiratory symptoms started two weeks ago and she felt better slightly last Monday 09/20/17, but the symptoms started back up again. The patient tylenol for the symptoms and they helped with the pain. She denies shortness of breath, chest pain, post nasal drip, or sinus tenderness.  She has also noted that she has been feeling imbalanced for the past two days and has been leaning more towards the right side when she is walking. She states that she has right sided numbness. She has noticed decreased sensation in bilateral hands. No changes in strength or speech. No tremors.   The patient's medication was reviewed and she is not on any medication that can contribute to her neurological symptoms. She was evaluated for cerebellar stroke due to her history of hyperlipidemia and hypertension. She was able to complete rapid alternating movements. Her heel to shin and finger to nose tests were normal. The patient also seems to have punctured her left tympanic membrane with a q tip.   The patient likely has a viral infection that is causing her upper respiratory symptoms, myalgias, and acute imbalance issues possibly due to vestibular neuritis. The patient was encouraged to undergo symptomatic management.   -symptomatic management with ibuprofen and hydration -return in 1 week if the symptoms do not resolve

## 2017-09-30 NOTE — Progress Notes (Signed)
   CC: Sore throat   HPI:  [name] is a 51 y.o. f with pmh of htn, hyperthyroidism, and sickle cell trait who presented for sore throat and imbalance issues. Please see assessment and plan for additional information.  Past Medical History:  Diagnosis Date  . Abdominal pain, recurrent 2009   per CT of the abd/pelvis (01/26/2008) -  Diffuse inflammatory change of the descending and rectosigmoid colon.  Additional inflammatory changes within the terminal ileum raise concern for inflammatory bowel disease (Crohn's disease).  . Back pain, chronic 2001   per MRI in 2001 - disc protrusion L5-6, to the right  . Complication of anesthesia    hard to wake up-sob-possible sleep apnea  . Depression   . Hypertension   . Hypothyroidism 2003   Thyroid US done in 2003 ([facility] in [facility]) - showed increased 24 hour uptake and the findings were consistent with Grave's disease; patient is s/p RAI therapy (09/19/2002)  . Sickle cell trait (HCC)   . Urinary frequency    Review of Systems:   Review of Systems  Constitutional: Positive for chills and fever.  HENT: Positive for ear pain (bilateral, more on right). Negative for ear discharge and hearing loss.   Respiratory: Positive for cough. Negative for shortness of breath.   Cardiovascular: Negative for chest pain and palpitations.  Gastrointestinal: Positive for nausea. Negative for vomiting.  Musculoskeletal: Positive for myalgias.  Neurological: Positive for headaches.     Physical Exam:  There were no vitals filed for this visit.  Physical Exam  Constitutional: She appears well-developed and well-nourished. No distress.  HENT:  Head: Normocephalic and atraumatic.  Right Ear: Tympanic membrane, external ear and ear canal normal. No drainage, swelling or tenderness. No decreased hearing is noted.  Left Ear: External ear and ear canal normal. There is tenderness. No drainage. Tympanic membrane is perforated. No decreased hearing is  noted.  Mouth/Throat: Oropharynx is clear and moist. No oropharyngeal exudate.  Eyes: EOM are normal.  Neck: Thyromegaly present.  Cardiovascular: Normal rate, regular rhythm and normal heart sounds.   Pulmonary/Chest: Effort normal and breath sounds normal. No respiratory distress. She has no wheezes.  Abdominal: Soft. Bowel sounds are normal. She exhibits no distension. There is tenderness (generalized).  Neurological: No cranial nerve deficit or sensory deficit. Coordination (finger to nose and heel to shin normal) normal.  Psychiatric: She has a normal mood and affect. Her behavior is normal. Judgment and thought content normal.    Assessment & Plan:   See Encounters Tab for problem based charting.  Patient seen with Dr. Criselda PeachesMullen

## 2017-09-30 NOTE — Patient Instructions (Addendum)
It was a pleasure to see you today Shannon Newton. Please make the following changes:  -Please take ibuprofen as needed over the counter for pain and fever -Please stay hydrated -Please follow up in 1 week if you continue to have symptoms  If you have any questions or concerns, please call our clinic at [phone] between 9am-5pm and after hours call [phone] and ask for the internal medicine resident on call. If you feel you are having a medical emergency please call 911.   Thank you, we look forward to help you remain healthy!

## 2017-10-01 NOTE — Progress Notes (Signed)
Internal Medicine Clinic Attending  I saw and evaluated the patient.  I personally confirmed the key portions of the history and exam documented by Dr. Chundi and I reviewed pertinent patient test results.  The assessment, diagnosis, and plan were formulated together and I agree with the documentation in the resident's note. 

## 2017-11-01 ENCOUNTER — Ambulatory Visit (INDEPENDENT_AMBULATORY_CARE_PROVIDER_SITE_OTHER): Payer: Self-pay | Admitting: *Deleted

## 2017-11-01 ENCOUNTER — Ambulatory Visit: Payer: Self-pay

## 2017-11-01 DIAGNOSIS — Z23 Encounter for immunization: Secondary | ICD-10-CM

## 2017-12-17 ENCOUNTER — Other Ambulatory Visit: Payer: Self-pay | Admitting: *Deleted

## 2017-12-17 DIAGNOSIS — E039 Hypothyroidism, unspecified: Secondary | ICD-10-CM

## 2017-12-17 MED ORDER — LEVOTHYROXINE SODIUM 88 MCG PO TABS: ORAL_TABLET | ORAL | 1 refills | Status: DC

## 2017-12-17 NOTE — Telephone Encounter (Signed)
Pt needs appt to discuss thyroid medications- giving 60 day supply

## 2017-12-27 ENCOUNTER — Other Ambulatory Visit: Payer: Self-pay

## 2017-12-27 ENCOUNTER — Encounter: Payer: Self-pay | Admitting: Internal Medicine

## 2017-12-27 ENCOUNTER — Ambulatory Visit: Payer: Self-pay | Admitting: Internal Medicine

## 2017-12-27 VITALS — BP 142/86 | HR 86 | Temp 97.8°F | Ht 61.0 in | Wt 216.7 lb

## 2017-12-27 DIAGNOSIS — H9201 Otalgia, right ear: Secondary | ICD-10-CM

## 2017-12-27 DIAGNOSIS — J040 Acute laryngitis: Secondary | ICD-10-CM

## 2017-12-27 DIAGNOSIS — M542 Cervicalgia: Secondary | ICD-10-CM

## 2017-12-27 MED ORDER — CYCLOBENZAPRINE HCL 5 MG PO TABS: 5.0000 mg | ORAL_TABLET | Freq: Three times a day (TID) | ORAL | 0 refills | Status: DC | PRN

## 2017-12-27 NOTE — Patient Instructions (Signed)
[name],  For your laryngitis please do  salt gargles, voice rest and hot showers For your neck pain please start taking Flexeril  Please follow-up if your symptoms do not improve or worsen

## 2017-12-27 NOTE — Assessment & Plan Note (Addendum)
Assessment: Acute laryngitis Patient reports a two-week history of hoarseness. During the interview patient is able to speak clearly in complete sentences. Mild hoarseness was noted. Patient is afebrile.  On exam no cervical lymphadenopathy or oropharyngeal exudates noted. Both ears without signs of infection.  Denies sick contacts.  Denies cough.  Likely viral etiology.  At this time will treat conservatively with voice rest, hot showers and salt gargles.   plan   -salt gargle, voice rest and hot showers - follow up as needed if symptoms do not improve or worsen

## 2017-12-27 NOTE — Progress Notes (Signed)
   CC: Acute laryngitis  HPI:  [name] is a 52 y.o. female with history noted below that presents to the acute care clinic for 2 week history of worse 4 days and right ear pain. She states that her symptoms improve and then regress. She has tried Tylenol. She denies sore throat, cough, difficulty swallowing.  She has subjective fevers at home.  Past Medical History:  Diagnosis Date  . Abdominal pain, recurrent 2009   per CT of the abd/pelvis (01/26/2008) -  Diffuse inflammatory change of the descending and rectosigmoid colon.  Additional inflammatory changes within the terminal ileum raise concern for inflammatory bowel disease (Crohn's disease).  . Back pain, chronic 2001   per MRI in 2001 - disc protrusion L5-6, to the right  . Complication of anesthesia    hard to wake up-sob-possible sleep apnea  . Depression   . Hypertension   . Hypothyroidism 2003   [place] [facility] - showed increased 24 hour uptake and the findings were consistent with Grave's disease; patient is s/p RAI therapy (09/19/2002)  . Sickle cell trait (HCC)   . Urinary frequency     Review of Systems:  ROS As noted per HPI  Physical Exam:  Vitals:   12/27/17 1509  BP: (!) 142/86  Pulse: 86  Temp: 97.8 F (36.6 C)  TempSrc: Oral  SpO2: 100%  Weight: 216 lb 11.2 oz (98.3 kg)  Height: 5\' 1"  (1.549 m)   Physical Exam  Constitutional: She is well-developed, well-nourished, and in no distress.  HENT:  Right Ear: External ear normal.  Left Ear: External ear normal.  Mouth/Throat: Oropharynx is clear and moist. No oropharyngeal exudate.  Tympanic membrane visualized in right and left ear. No bulging noted     Assessment & Plan:   See encounters tab for problem based medical decision making.   Patient discussed with Dr. Heide SparkNarendra

## 2017-12-27 NOTE — Assessment & Plan Note (Signed)
Assessment: Neck pain  Patient reports a several week history of neck pain. On exam tenderness and tightness is noted along cervical paraspinal musculature on right side.  Will treat with short course of Flexeril.    Plan -Flexeril

## 2017-12-27 NOTE — Progress Notes (Signed)
Internal Medicine Clinic Attending  Case discussed with Dr. Hoffman at the time of the visit.  We reviewed the resident's history and exam and pertinent patient test results.  I agree with the assessment, diagnosis, and plan of care documented in the resident's note.  

## 2018-02-07 ENCOUNTER — Encounter: Payer: Self-pay | Admitting: Internal Medicine

## 2018-02-07 ENCOUNTER — Ambulatory Visit: Payer: Self-pay

## 2018-04-21 ENCOUNTER — Other Ambulatory Visit: Payer: Self-pay | Admitting: *Deleted

## 2018-04-21 DIAGNOSIS — E039 Hypothyroidism, unspecified: Secondary | ICD-10-CM

## 2018-04-21 MED ORDER — LEVOTHYROXINE SODIUM 88 MCG PO TABS: ORAL_TABLET | ORAL | 1 refills | Status: DC

## 2018-04-21 NOTE — Telephone Encounter (Signed)
Okay to refill for 30 days. Patient must be seen if she would like to continue with [place] as it has been some time since her last appointment.   Fransico Him

## 2018-04-21 NOTE — Telephone Encounter (Addendum)
Called pt - no answer; left message to call us back to schedule an appt. Also sent message to front office to try calling pt again.

## 2018-04-25 NOTE — Telephone Encounter (Signed)
Appt with PCP 06/08/2018. Kinnie Feil, RN, BSN

## 2018-06-08 ENCOUNTER — Encounter: Payer: Self-pay | Admitting: Internal Medicine

## 2018-06-29 ENCOUNTER — Other Ambulatory Visit: Payer: Self-pay | Admitting: Internal Medicine

## 2018-06-29 DIAGNOSIS — E039 Hypothyroidism, unspecified: Secondary | ICD-10-CM

## 2018-06-29 NOTE — Telephone Encounter (Signed)
Needs refill on levothyroxine 88mcg asked pharmacy-no refills @ [address] [place] [address] [place] contact # [phone]

## 2018-06-30 MED ORDER — LEVOTHYROXINE SODIUM 88 MCG PO TABS: ORAL_TABLET | ORAL | 0 refills | Status: DC

## 2018-06-30 NOTE — Telephone Encounter (Signed)
Patient will need TSH and Acute care visit prior to any additional refills.

## 2018-08-03 ENCOUNTER — Other Ambulatory Visit: Payer: Self-pay | Admitting: Internal Medicine

## 2018-08-03 DIAGNOSIS — E039 Hypothyroidism, unspecified: Secondary | ICD-10-CM

## 2018-08-03 NOTE — Telephone Encounter (Signed)
Please inform Mrs Burnadette PeterLynch that I will need to order a TSH prior to refilling her script again. This is unfortunately progressed far too long without an assessment of her thyroid function. An [facility] visit is appropriate if she is agreeable and can not make our September appointment.   Thank you,  Lanelle BalLawrence Amyah Clawson, MD

## 2018-08-04 NOTE — Telephone Encounter (Signed)
Called pt - no answer,nor vm ;"call cannot be completed".

## 2018-08-05 NOTE — Telephone Encounter (Signed)
Called pt again , no answer - unable to leave message "call cannot be completed at this time".

## 2018-08-17 ENCOUNTER — Encounter: Payer: Self-pay | Admitting: Internal Medicine

## 2018-09-07 ENCOUNTER — Encounter (INDEPENDENT_AMBULATORY_CARE_PROVIDER_SITE_OTHER): Payer: Self-pay

## 2018-09-07 ENCOUNTER — Ambulatory Visit (INDEPENDENT_AMBULATORY_CARE_PROVIDER_SITE_OTHER): Payer: Self-pay | Admitting: Internal Medicine

## 2018-09-07 ENCOUNTER — Encounter: Payer: Self-pay | Admitting: Internal Medicine

## 2018-09-07 VITALS — BP 153/88 | HR 84 | Temp 98.0°F | Wt 215.6 lb

## 2018-09-07 DIAGNOSIS — Z23 Encounter for immunization: Secondary | ICD-10-CM

## 2018-09-07 DIAGNOSIS — E785 Hyperlipidemia, unspecified: Secondary | ICD-10-CM

## 2018-09-07 DIAGNOSIS — Z79899 Other long term (current) drug therapy: Secondary | ICD-10-CM

## 2018-09-07 DIAGNOSIS — R5383 Other fatigue: Secondary | ICD-10-CM

## 2018-09-07 DIAGNOSIS — Z0000 Encounter for general adult medical examination without abnormal findings: Secondary | ICD-10-CM

## 2018-09-07 DIAGNOSIS — E039 Hypothyroidism, unspecified: Secondary | ICD-10-CM

## 2018-09-07 DIAGNOSIS — M545 Low back pain, unspecified: Secondary | ICD-10-CM

## 2018-09-07 DIAGNOSIS — Z8739 Personal history of other diseases of the musculoskeletal system and connective tissue: Secondary | ICD-10-CM

## 2018-09-07 DIAGNOSIS — I10 Essential (primary) hypertension: Secondary | ICD-10-CM

## 2018-09-07 LAB — GLUCOSE, CAPILLARY: Glucose-Capillary: 85 mg/dL (ref 70–99)

## 2018-09-07 LAB — POCT GLYCOSYLATED HEMOGLOBIN (HGB A1C): Hemoglobin A1C: 5.9 % — AB (ref 4.0–5.6)

## 2018-09-07 MED ORDER — PRAVASTATIN SODIUM 40 MG PO TABS: 40.0000 mg | ORAL_TABLET | Freq: Every evening | ORAL | 1 refills | Status: DC

## 2018-09-07 MED ORDER — LEVOTHYROXINE SODIUM 88 MCG PO TABS: ORAL_TABLET | ORAL | 1 refills | Status: DC

## 2018-09-07 MED ORDER — AMLODIPINE BESYLATE 10 MG PO TABS: 10.0000 mg | ORAL_TABLET | Freq: Every day | ORAL | 1 refills | Status: DC

## 2018-09-07 NOTE — Progress Notes (Signed)
CC: Routine evaluation for her chronic HTN, hypothyroidism   HPI:[name] is a 52 y.o. female who presents today for evaluation of her chronic medical conditions.    Back Pain: Uncertain if this is recurrence of her pain associated with disc herniation vs muscular pain. Patient is resistant to surgical or injection therapy.   Acute onset after lifting a large/heavy box at work approximately 8 days prior. 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 The pain initially improved with tylenol and a warm bath that evening but promptly returned the following day at work. She noted that the pain has not significantly improved since onset. The patients recalls a history of sciatica as well as herniated disc in the past. She does not however, recall if this pain is similar to that pain. She denied numbness or tingling near the vaginal or anal area, loss of strength or sensation changes to the legs.  On physical exam there is a point of tenderness to the right of the lumbar vertebra at approximately the L1-2 level that does not include the spine and appears to be muscular due to the distance from the spine. Given her current symptoms, I recommended NSAIDS with ibuprofen BID 800mg 's daily for two weeks, rest when able, using a weight support belt for lifting if necessary and returning if the pain fails to resolve. I do not feel as if she requires imaging at this junction and will proceed with conservative care.   HTN: Not well controlled today but she has been off of her Amlodipine due to missed appointments. We will resume Amlodipine 10mg  daily. She is to observe for symptoms of hypotension, orthostatics and or peripheral edema of the lower extremities.   Hypothyroidism: Patient endorses feeling tired and a little "down" of recent since her back  injury. Denied depressive symptoms. Most likely related to the back pain. We will obtain a TSH and CBC today to rule out anemia and hypothyroidism as her most recent TSH was in 2017.  TSH-1.97 WNL's, no adjustments indicated CBC unremarkable, will mail results to patient  HLD: Total 211, LDL 112, given her risk factors will continue pravastatin 40mg  daily  Healthcare Maintenance: Mammogram ordered Influenza vaccine administered  Colonoscopy, will defer for now as although patient agreed she did not realize the cost associated. As such, we will attempt to assist her with financial means to better enable the screening exam. Recommend iFOBT at her BP check in one month.   Past Medical History:  Diagnosis Date  . Abdominal pain, recurrent 2009   per CT of the abd/pelvis (01/26/2008) -  Diffuse inflammatory change of the descending and rectosigmoid colon.  Additional inflammatory changes within the terminal ileum raise concern for inflammatory bowel disease (Crohn's disease).  . Back pain, chronic 2001   per MRI in 2001 - disc protrusion L5-6, to the right  . Complication of anesthesia    hard to wake up-sob-possible sleep apnea  . Depression   . Hypertension   . Hypothyroidism 2003   Thyroid US done in 2003 (results in [place]) - showed increased 24 hour uptake and the findings were consistent with Grave's disease; patient is s/p RAI therapy (09/19/2002)  . Sickle cell trait (HCC)   . Urinary frequency    Review of Systems:  ROS negative except as per HPI.  Physical Exam:  Vitals:   09/07/18 1529  BP: (!) 153/88  Pulse: 84  Temp: 98 F (36.7 C)  TempSrc: Oral  SpO2: 100%  Weight: 215 lb 9.6 oz (97.8 kg)   General: A/O x4, in no acute distress, afebrile, nondiaphoretic Cardio: RRR, no mrg's Pulm: CTA bilaterally  MSK: bilateral lower extremities nontender, nonedematous   Assessment & Plan:   See Encounters Tab for problem based charting.  Patient discussed with Dr.  Criselda Peaches

## 2018-09-07 NOTE — Patient Instructions (Signed)
FOLLOW-UP INSTRUCTIONS When: one month, then three months after that for a routine checkup For: A blood pressure check What to bring: All of your medications in their bottles  Thank you for your visit to the [facility] today.  I have made several medication changes and ordered several labs. If you have any questions or concerns please feel free to contact us at any time.  I will mail your lab results to you as soon as they are all available.

## 2018-09-08 DIAGNOSIS — Z23 Encounter for immunization: Secondary | ICD-10-CM | POA: Insufficient documentation

## 2018-09-08 DIAGNOSIS — R5383 Other fatigue: Secondary | ICD-10-CM | POA: Insufficient documentation

## 2018-09-08 LAB — LIPID PANEL
CHOL/HDL RATIO: 2.7 ratio (ref 0.0–4.4)
Cholesterol, Total: 211 mg/dL — ABNORMAL HIGH (ref 100–199)
HDL: 78 mg/dL (ref >39)
LDL Calculated: 112 mg/dL — ABNORMAL HIGH (ref 0–99)
TRIGLYCERIDES: 103 mg/dL (ref 0–149)
VLDL Cholesterol Cal: 21 mg/dL (ref 5–40)

## 2018-09-08 LAB — CBC
HEMATOCRIT: 40.2 % (ref 34.0–46.6)
Hemoglobin: 13.2 g/dL (ref 11.1–15.9)
MCH: 23.4 pg — ABNORMAL LOW (ref 26.6–33.0)
MCHC: 32.8 g/dL (ref 31.5–35.7)
MCV: 71 fL — ABNORMAL LOW (ref 79–97)
Platelets: 293 10*3/uL (ref 150–450)
RBC: 5.64 x10E6/uL — ABNORMAL HIGH (ref 3.77–5.28)
RDW: 16 % — AB (ref 12.3–15.4)
WBC: 5.7 10*3/uL (ref 3.4–10.8)

## 2018-09-08 LAB — BMP8+ANION GAP
Anion Gap: 15 mmol/L (ref 10.0–18.0)
BUN/Creatinine Ratio: 13 (ref 9–23)
BUN: 10 mg/dL (ref 6–24)
CALCIUM: 9.2 mg/dL (ref 8.7–10.2)
CO2: 21 mmol/L (ref 20–29)
Chloride: 104 mmol/L (ref 96–106)
Creatinine, Ser: 0.75 mg/dL (ref 0.57–1.00)
GFR calc non Af Amer: 91 mL/min/{1.73_m2} (ref >59)
GFR, EST AFRICAN AMERICAN: 105 mL/min/{1.73_m2} (ref >59)
GLUCOSE: 87 mg/dL (ref 65–99)
POTASSIUM: 4.1 mmol/L (ref 3.5–5.2)
SODIUM: 140 mmol/L (ref 134–144)

## 2018-09-08 LAB — TSH: TSH: 1.97 u[IU]/mL (ref 0.450–4.500)

## 2018-09-09 ENCOUNTER — Encounter: Payer: Self-pay | Admitting: Internal Medicine

## 2018-09-09 DIAGNOSIS — M545 Low back pain, unspecified: Secondary | ICD-10-CM | POA: Insufficient documentation

## 2018-09-09 NOTE — Assessment & Plan Note (Signed)
  Healthcare Maintenance: Mammogram ordered Colonoscopy, will defer for now as although patient agreed she did not realize the cost associated. As such, we will attempt to assist her with financial means to better enable the screening exam. Recommend iFOBT at her BP check in one month.

## 2018-09-09 NOTE — Assessment & Plan Note (Signed)
  Back Pain: Uncertain if this is recurrence of her pain associated with disc herniation vs muscular pain. Patient is resistant to surgical or injection therapy.   Acute onset after lifting a large/heavy box at work approximately 8 days prior. 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 The pain initially improved with tylenol and a warm bath that evening but promptly returned the following day at work. She noted that the pain has not significantly improved since onset. The patients recalls a history of sciatica as well as herniated disc in the past. She does not however, recall if this pain is similar to that pain. She denied numbness or tingling near the vaginal or anal area, loss of strength or sensation changes to the legs.  On physical exam there is a point of tenderness to the right of the lumbar vertebra at approximately the L1-2 level that does not include the spine and appears to be muscular due to the distance from the spine. Given her current symptoms, I recommended NSAIDS with ibuprofen BID 800mg 's daily for two weeks, rest when able, using a weight support belt for lifting if necessary and returning if the pain fails to resolve. I do not feel as if she requires imaging at this junction and will proceed with conservative care.

## 2018-09-09 NOTE — Assessment & Plan Note (Signed)
Hypothyroidism: Patient endorses feeling tired and a little "down" of recent since her back injury. Denied depressive symptoms. Most likely related to the back pain. We will obtain a TSH and CBC today to rule out anemia and hypothyroidsm as her most recent TSH was in 2017.  TSH-1.97 WNL's, no adjustments indicated CBC unremarkable, will mail results to patient

## 2018-09-09 NOTE — Assessment & Plan Note (Signed)
HTN: Not well controlled today but she has been off of her Amlodipine due to missed appointments. We will resume Amlodipine 10mg  daily. She is to observe for symptoms of hypotension, orthostatics and or peripheral edema of the lower extremities.

## 2018-09-09 NOTE — Assessment & Plan Note (Signed)
HLD: Total 211, LDL 112, given her risk factors will continue pravastatin 40mg  daily

## 2018-09-09 NOTE — Assessment & Plan Note (Signed)
Influenza vaccine administered

## 2018-09-10 NOTE — Progress Notes (Signed)
Internal Medicine Clinic Attending  Case discussed with Dr. Harbrecht at the time of the visit.  We reviewed the resident's history and exam and pertinent patient test results.  I agree with the assessment, diagnosis, and plan of care documented in the resident's note.   

## 2018-10-12 ENCOUNTER — Ambulatory Visit: Payer: Self-pay

## 2018-12-28 ENCOUNTER — Encounter: Payer: Self-pay | Admitting: Internal Medicine

## 2018-12-30 ENCOUNTER — Other Ambulatory Visit: Payer: Self-pay | Admitting: Internal Medicine

## 2018-12-30 DIAGNOSIS — E039 Hypothyroidism, unspecified: Secondary | ICD-10-CM

## 2019-01-31 NOTE — Progress Notes (Deleted)
   CC: HTN,   HPI:Ms.ALDEAN DONAHO is a 53 y.o. female who presents for evaluation of ***. Please see individual problem based A/P for details.  Hypertension: Patient's BP today is ***/*** with a goal of <140/80. The patient endorses adherence to their medications regimen. They denied, chest pain, headache, visual changes, lightheadedness, weakness, dizziness on standing, swelling in the feet or ankles.   Plan: Continue *** ***mg *** Continue *** ***mg daily Continue *** ***mg BID  ***: ***  Plan: ***  Cervical cancer screening: Patient in need of screening q3 yrs. Will inquire today if she wishes to have this completed.   Plan:  ***  Depression, PHQ-9: Based on the patients    Office Visit from 09/07/2018 in [facility]  PHQ-9 Total Score  18     score we have ***.  Past Medical History:  Diagnosis Date  . Abdominal pain, recurrent 2009   per CT of the abd/pelvis (01/26/2008) -  Diffuse inflammatory change of the descending and rectosigmoid colon.  Additional inflammatory changes within the terminal ileum raise concern for inflammatory bowel disease (Crohn's disease).  . Back pain, chronic 2001   per MRI in 2001 - disc protrusion L5-6, to the right  . Complication of anesthesia    hard to wake up-sob-possible sleep apnea  . Depression   . Hypertension   . Hypothyroidism 2003   Thyroid US done in 2003 (results in [place]) - showed increased 24 hour uptake and the findings were consistent with Grave's disease; patient is s/p RAI therapy (09/19/2002)  . Sickle cell trait (HCC)   . Urinary frequency    Review of Systems:  *** ROS negative except as per HPI.  Physical Exam: There were no vitals filed for this visit. *** Assessment & Plan:   See Encounters Tab for problem based charting.  Patient {GC/GE:[id number]::"discussed with","seen with"} Dr. {NAMES:[id number]::"Butcher","Granfortuna","E.  Hoffman","Klima","Mullen","Narendra","Raines","Vincent"}

## 2019-02-01 ENCOUNTER — Encounter: Payer: Self-pay | Admitting: Internal Medicine

## 2019-04-03 ENCOUNTER — Other Ambulatory Visit: Payer: Self-pay

## 2019-04-03 DIAGNOSIS — E039 Hypothyroidism, unspecified: Secondary | ICD-10-CM

## 2019-04-03 MED ORDER — LEVOTHYROXINE SODIUM 88 MCG PO TABS: 88.0000 ug | ORAL_TABLET | Freq: Every day | ORAL | 0 refills | Status: DC

## 2019-04-03 NOTE — Telephone Encounter (Signed)
levothyroxine (SYNTHROID, LEVOTHROID) 88 MCG tablet, refill request @ [facility] on [address].

## 2019-04-12 ENCOUNTER — Ambulatory Visit: Payer: Self-pay | Admitting: Internal Medicine

## 2019-04-12 ENCOUNTER — Other Ambulatory Visit: Payer: Self-pay

## 2019-04-12 NOTE — Progress Notes (Deleted)
Void  

## 2019-06-29 ENCOUNTER — Other Ambulatory Visit: Payer: Self-pay | Admitting: Internal Medicine

## 2019-06-29 DIAGNOSIS — E039 Hypothyroidism, unspecified: Secondary | ICD-10-CM

## 2019-07-03 NOTE — Telephone Encounter (Signed)
Please schedule at my next available.  Thank you

## 2019-07-16 ENCOUNTER — Encounter: Payer: Self-pay | Admitting: *Deleted

## 2019-08-02 ENCOUNTER — Encounter: Payer: Self-pay | Admitting: Internal Medicine

## 2019-08-02 ENCOUNTER — Telehealth: Payer: Self-pay | Admitting: Internal Medicine

## 2019-08-02 DIAGNOSIS — Z1211 Encounter for screening for malignant neoplasm of colon: Secondary | ICD-10-CM | POA: Insufficient documentation

## 2019-08-02 NOTE — Telephone Encounter (Signed)
Attempted to contact Shannon Newton to complete a telehealth visit as she did not show for her continuity visit. Please confirm her phone number and reschedule her at my next available for a continuity visit.

## 2019-08-02 NOTE — Telephone Encounter (Signed)
Thank you Chilon.  

## 2019-08-02 NOTE — Progress Notes (Deleted)
void

## 2019-08-02 NOTE — Telephone Encounter (Signed)
Called pt to resh appt.  Unable to leave a message.  A No-Show letter was mailed to the patient to contact the [facility] sch. To sch another appt.

## 2019-08-15 NOTE — Progress Notes (Signed)
   CC: Routine evaluation of hypertension, right shoulder pain and hypothyroidism  HPI:[name] is a 53 y.o. female who presents for evaluation of right shoulder pain, hypertension, hypothyroidism and bunions of the feet bilaterally. Please see individual problem based A/P for details.  Depression, PHQ-9: Based on the patients    Office Visit from 08/16/2019 in [place]  PHQ-9 Total Score  9     score we have decided to continue to monitor.  Past Medical History:  Diagnosis Date  . Abdominal pain, recurrent 2009   per CT of the abd/pelvis (01/26/2008) -  Diffuse inflammatory change of the descending and rectosigmoid colon.  Additional inflammatory changes within the terminal ileum raise concern for inflammatory bowel disease (Crohn's disease).  . Back pain, chronic 2001   per MRI in 2001 - disc protrusion L5-6, to the right  . Complication of anesthesia    hard to wake up-sob-possible sleep apnea  . Depression   . Hypertension   . Hypothyroidism 2003   Thyroid US done in 2003 (results in [place]) - showed increased 24 hour uptake and the findings were consistent with Grave's disease; patient is s/p RAI therapy (09/19/2002)  . Sickle cell trait ([place])   . Urinary frequency    Review of Systems:  ROS negative except as per HPI.  Physical Exam: Vitals:   08/16/19 1429  BP: (!) 141/81  Pulse: 84  Temp: 98.1 F (36.7 C)  TempSrc: Oral  SpO2: 98%  Weight: 225 lb 12.8 oz (102.4 kg)  Height: 5\' 1"  (1.549 m)   General: A/O x4, in no acute distress, afebrile, nondiaphoretic HEENT: PEERL, EMO intact, Mallampati score 3-4, unable to visualize posterior oropharynx, no lymphadenopathy appreciated postauricularly, or cervically.  Palpation of the thyroid was unremarkable Cardio: RRR, no mrg's  Pulmonary: CTA bilaterally, no wheezing or crackles  Abdomen: Bowel sounds normal, soft, nontender  MSK: BLE nontender, nonedematous Neuro: Alert, conversational,  normal gait Psych: Appropriate affect, not depressed in appearance, engages well  Assessment & Plan:   See Encounters Tab for problem based charting.  Patient discussed with Dr. Angelia Mould

## 2019-08-16 ENCOUNTER — Encounter: Payer: Self-pay | Admitting: Internal Medicine

## 2019-08-16 ENCOUNTER — Telehealth: Payer: Self-pay | Admitting: Pharmacist

## 2019-08-16 ENCOUNTER — Other Ambulatory Visit: Payer: Self-pay

## 2019-08-16 ENCOUNTER — Ambulatory Visit: Payer: Self-pay | Admitting: Internal Medicine

## 2019-08-16 VITALS — BP 141/81 | HR 84 | Temp 98.1°F | Ht 61.0 in | Wt 225.8 lb

## 2019-08-16 DIAGNOSIS — M21611 Bunion of right foot: Secondary | ICD-10-CM

## 2019-08-16 DIAGNOSIS — Z23 Encounter for immunization: Secondary | ICD-10-CM

## 2019-08-16 DIAGNOSIS — I10 Essential (primary) hypertension: Secondary | ICD-10-CM

## 2019-08-16 DIAGNOSIS — Z9114 Patient's other noncompliance with medication regimen: Secondary | ICD-10-CM

## 2019-08-16 DIAGNOSIS — E039 Hypothyroidism, unspecified: Secondary | ICD-10-CM

## 2019-08-16 DIAGNOSIS — E785 Hyperlipidemia, unspecified: Secondary | ICD-10-CM

## 2019-08-16 DIAGNOSIS — R490 Dysphonia: Secondary | ICD-10-CM | POA: Insufficient documentation

## 2019-08-16 DIAGNOSIS — M25511 Pain in right shoulder: Secondary | ICD-10-CM

## 2019-08-16 DIAGNOSIS — Z79890 Hormone replacement therapy (postmenopausal): Secondary | ICD-10-CM

## 2019-08-16 DIAGNOSIS — Z79899 Other long term (current) drug therapy: Secondary | ICD-10-CM

## 2019-08-16 DIAGNOSIS — M21612 Bunion of left foot: Secondary | ICD-10-CM

## 2019-08-16 DIAGNOSIS — E559 Vitamin D deficiency, unspecified: Secondary | ICD-10-CM

## 2019-08-16 DIAGNOSIS — M21619 Bunion of unspecified foot: Secondary | ICD-10-CM

## 2019-08-16 MED ORDER — VITAMIN D3 10 MCG (400 UNIT) PO TABS: 800.0000 [IU] | ORAL_TABLET | Freq: Every day | ORAL | 11 refills | Status: DC

## 2019-08-16 MED ORDER — ALBUTEROL SULFATE HFA 108 (90 BASE) MCG/ACT IN AERS: 1.0000 | INHALATION_SPRAY | Freq: Four times a day (QID) | RESPIRATORY_TRACT | 1 refills | Status: DC | PRN

## 2019-08-16 MED ORDER — PRAVASTATIN SODIUM 40 MG PO TABS: 40.0000 mg | ORAL_TABLET | Freq: Every evening | ORAL | 3 refills | Status: DC

## 2019-08-16 MED ORDER — LEVOTHYROXINE SODIUM 88 MCG PO TABS: 88.0000 ug | ORAL_TABLET | Freq: Every day | ORAL | 3 refills | Status: DC

## 2019-08-16 MED ORDER — PANTOPRAZOLE SODIUM 40 MG PO TBEC: 40.0000 mg | DELAYED_RELEASE_TABLET | Freq: Every day | ORAL | 0 refills | Status: DC

## 2019-08-16 MED ORDER — AMLODIPINE BESYLATE 10 MG PO TABS: 10.0000 mg | ORAL_TABLET | Freq: Every day | ORAL | 3 refills | Status: DC

## 2019-08-16 NOTE — Assessment & Plan Note (Addendum)
Hypertension: Patient's BP today is 141/81 with a goal of <140/80. The patient does not endorse adherence to her medication regimen.  She denied, chest pain, headache, visual changes, lightheadedness, weakness, dizziness on standing, swelling in the feet or ankles.   Plan: Restart amlodipine 10 mg daily BMP today  Addendum: BMP unremarkable, no therapeutic changes indicated. Repeat as indicated or in one year

## 2019-08-16 NOTE — Assessment & Plan Note (Signed)
Influenza vaccination: Patient agrees to receipt of the influenza vaccine today

## 2019-08-16 NOTE — Telephone Encounter (Signed)
Please call patient back. Pt unable to afford medications given today. Pt is sch to see the Financial Counselor on 09/13/2019 and is in need of suggestions on how to obtain medication until then.    albuterol (PROVENTIL HFA) 108 (90 Base) MCG/ACT inhaler    amLODipine (NORVASC) 10 MG tablet    Cholecalciferol (VITAMIN D3) 10 MCG (400 UNIT) tablet    levothyroxine (SYNTHROID) 88 MCG tablet    pantoprazole (PROTONIX) 40 MG tablet    pravastatin (PRAVACHOL) 40 MG tablet

## 2019-08-16 NOTE — Assessment & Plan Note (Addendum)
Hoarseness: Patient endorses a 2 to 3-year history of persistent hoarseness is worse in the past 6 months.  She has not recall getting factors such as an acute illness, excessive use, or he can allow environment, autoimmune disease, and aside from her thyroid disease he has no known related pathology.  She states that she has been treated in the past with Flonase for postnasal drip, albuterol for occasional wheezing, a course of an H2 acid reducer for possible acid reflux, but has not been to see an ear nose and throat doctor.  She says that her voice is somewhat better in the morning and then gradually fades ever so slightly throughout the day.  She does not have difficulty swallowing, or pain with swallowing but does endorse occasional sensation of fullness in the lower portion of her throat.  In the past she had endorsed symptoms of postnasal drip but this has since resolved and her hoarseness did not improve. She is a non-smoker.  Has not been intubated.  Plan: Referral to ENT We will treat with a course of a PPI blocker Albuterol as needed for wheezing We will obtain CBC with differential to evaluate for eosinophilia  Addendum: No Eosinophilia noted on CBC and it is otherwise unchanged with a chronic normocytic microcytosis.

## 2019-08-16 NOTE — Assessment & Plan Note (Signed)
Bilateral, worse on left. Bunion on left foot: Patient concern for bilateral bunions most notably of the left foot.  She states that these became more noticeable when she began wearing tight steel toed boots.   Plan: I advised her to obtain a comfortable shoe to her shoes, obtain bunion braces, regular follow-up with [place] if condition does not improve or worsens

## 2019-08-16 NOTE — Assessment & Plan Note (Signed)
HLD: Refilled pravastatin.

## 2019-08-16 NOTE — Patient Instructions (Addendum)
Thank you for your visit to the [facility] today. It was good to see you again. We have ordered several labs today including a TSH, CBC, BMP, Vit D and will refer you to an Ear Nose and Throat (ENT) doctor.   In the meantime, please start taking the the pantoprazole and use the albuterol as needed for the wheezing and hoarseness. It is important that you see ENT though.   For the places/bunions on your feet, please purchase one of the advised braces/correctors and find a more comfortable pair of shoes. We may need to refer you to podiatry if this worsens.

## 2019-08-16 NOTE — Assessment & Plan Note (Addendum)
Vitamin D Deficiency: History of vitamin D deficiency. Plan: We will check vitamin D level today  refilled vitamin D  Addendum: Vit D level subtherapeutic at 10, ordered 50000U Q7 days for 8 doses. Called patient to notify, there was not answer.

## 2019-08-16 NOTE — Assessment & Plan Note (Addendum)
Hypothyroidism: TSH previously stable at 1.97.  Patient continues to take her 63 MCG daily dose.  Plan: Repeat TSH today Continue current dose unless otherwise indicated by TSH  Addendum: TSH WNL's, no therapeutic change initiated

## 2019-08-17 LAB — CBC WITH DIFFERENTIAL/PLATELET
Basophils Absolute: 0 10*3/uL (ref 0.0–0.2)
Basos: 0 %
EOS (ABSOLUTE): 0.1 10*3/uL (ref 0.0–0.4)
Eos: 1 %
Hematocrit: 40.3 % (ref 34.0–46.6)
Hemoglobin: 13 g/dL (ref 11.1–15.9)
Immature Grans (Abs): 0 10*3/uL (ref 0.0–0.1)
Immature Granulocytes: 0 %
Lymphocytes Absolute: 2.6 10*3/uL (ref 0.7–3.1)
Lymphs: 32 %
MCH: 23.7 pg — ABNORMAL LOW (ref 26.6–33.0)
MCHC: 32.3 g/dL (ref 31.5–35.7)
MCV: 73 fL — ABNORMAL LOW (ref 79–97)
Monocytes Absolute: 0.7 10*3/uL (ref 0.1–0.9)
Monocytes: 8 %
Neutrophils Absolute: 4.6 10*3/uL (ref 1.4–7.0)
Neutrophils: 59 %
Platelets: 327 10*3/uL (ref 150–450)
RBC: 5.49 x10E6/uL — ABNORMAL HIGH (ref 3.77–5.28)
RDW: 16 % — ABNORMAL HIGH (ref 11.7–15.4)
WBC: 7.9 10*3/uL (ref 3.4–10.8)

## 2019-08-17 LAB — VITAMIN D 25 HYDROXY (VIT D DEFICIENCY, FRACTURES): Vit D, 25-Hydroxy: 10.7 ng/mL — ABNORMAL LOW (ref 30.0–100.0)

## 2019-08-17 LAB — BMP8+ANION GAP
Anion Gap: 17 mmol/L (ref 10.0–18.0)
BUN/Creatinine Ratio: 17 (ref 9–23)
BUN: 12 mg/dL (ref 6–24)
CO2: 20 mmol/L (ref 20–29)
Calcium: 9.8 mg/dL (ref 8.7–10.2)
Chloride: 102 mmol/L (ref 96–106)
Creatinine, Ser: 0.69 mg/dL (ref 0.57–1.00)
GFR calc Af Amer: 113 mL/min/{1.73_m2} (ref >59)
GFR calc non Af Amer: 98 mL/min/{1.73_m2} (ref >59)
Glucose: 79 mg/dL (ref 65–99)
Potassium: 4 mmol/L (ref 3.5–5.2)
Sodium: 139 mmol/L (ref 134–144)

## 2019-08-17 LAB — TSH: TSH: 2.33 u[IU]/mL (ref 0.450–4.500)

## 2019-08-18 MED ORDER — CHOLECALCIFEROL 1.25 MG (50000 UT) PO CAPS: 50000.0000 [IU] | ORAL_CAPSULE | Freq: Every day | ORAL | 0 refills | Status: DC

## 2019-08-18 NOTE — Addendum Note (Signed)
Addended by: Nicola Girt on: 08/18/2019 01:24 PM   Modules accepted: Orders

## 2019-08-22 NOTE — Progress Notes (Signed)
Internal Medicine Clinic Attending  Case discussed with Dr. Harbrecht at the time of the visit.  We reviewed the resident's history and exam and pertinent patient test results.  I agree with the assessment, diagnosis, and plan of care documented in the resident's note.   

## 2019-09-13 ENCOUNTER — Ambulatory Visit: Payer: Self-pay

## 2019-10-04 ENCOUNTER — Ambulatory Visit: Payer: Self-pay

## 2019-10-10 ENCOUNTER — Encounter: Payer: Self-pay | Admitting: Internal Medicine

## 2019-10-27 ENCOUNTER — Other Ambulatory Visit: Payer: Self-pay | Admitting: Internal Medicine

## 2019-10-27 DIAGNOSIS — E039 Hypothyroidism, unspecified: Secondary | ICD-10-CM

## 2019-11-06 NOTE — Addendum Note (Signed)
Addended by: Hulan Fray on: 11/06/2019 06:03 PM   Modules accepted: Orders

## 2019-11-14 ENCOUNTER — Other Ambulatory Visit: Payer: Self-pay | Admitting: Internal Medicine

## 2019-11-14 DIAGNOSIS — E039 Hypothyroidism, unspecified: Secondary | ICD-10-CM

## 2020-03-03 ENCOUNTER — Emergency Department (HOSPITAL_COMMUNITY)
Admission: EM | Admit: 2020-03-03 | Discharge: 2020-03-03 | Disposition: A | Discharge status: Home / Self Care | Payer: Self-pay | Attending: Emergency Medicine | Admitting: Emergency Medicine

## 2020-03-03 ENCOUNTER — Other Ambulatory Visit: Payer: Self-pay

## 2020-03-03 ENCOUNTER — Encounter (HOSPITAL_COMMUNITY): Payer: Self-pay | Admitting: Emergency Medicine

## 2020-03-03 DIAGNOSIS — I10 Essential (primary) hypertension: Secondary | ICD-10-CM | POA: Insufficient documentation

## 2020-03-03 DIAGNOSIS — K644 Residual hemorrhoidal skin tags: Secondary | ICD-10-CM | POA: Insufficient documentation

## 2020-03-03 DIAGNOSIS — Z79899 Other long term (current) drug therapy: Secondary | ICD-10-CM | POA: Insufficient documentation

## 2020-03-03 MED ORDER — HYDROCORTISONE ACETATE 25 MG RE SUPP: 25.0000 mg | Freq: Two times a day (BID) | RECTAL | 0 refills | Status: DC

## 2020-03-03 MED ORDER — POLYETHYLENE GLYCOL 3350 17 G PO PACK: 17.0000 g | PACK | Freq: Every day | ORAL | 0 refills | Status: AC

## 2020-03-03 NOTE — ED Triage Notes (Signed)
C/o hemorrhoids since this morning.

## 2020-03-03 NOTE — Discharge Instructions (Addendum)
You have been evaluated for your rectal discomfort.  It appears that you have an external hemorrhoid, although rectal prolapse could have somewhat of a similar appearance.  Please follow-up with [facility] for outpatient management.  Take MiraLAX as prescribed to decrease constipation, use Anusol cream for discomfort.

## 2020-03-03 NOTE — ED Provider Notes (Signed)
[facility] EMERGENCY DEPARTMENT Provider Note   CSN: [id number] Arrival date & time: 03/03/20  1755     History Chief Complaint  Patient presents with  . Hemorrhoids    Shannon Newton is a 54 y.o. female.  The history is provided by the patient and medical records. No language interpreter was used.     54 year old female with recurrent abdominal discomfort, obesity, presenting complaining of concern of hemorrhoid.  Patient reports today when she used the bathroom, she felt discomfort about her rectum.  She felt a nodule that is irritating about her rectum.  She has never experienced this before prompting this ER visit.  She admits that she has been sitting on a hard chair for the past month.  She also has had recurrent constipation and having to strain to have bowel movement.  She did notice trace of  blood when she wipes yesterday.  She endorsed feeling a bit lightheadedness.  She does not complain of any abdominal pain, fever nausea or vomiting or dysuria.  Past Medical History:  Diagnosis Date  . Abdominal pain, recurrent 2009   per CT of the abd/pelvis (01/26/2008) -  Diffuse inflammatory change of the descending and rectosigmoid colon.  Additional inflammatory changes within the terminal ileum raise concern for inflammatory bowel disease (Crohn's disease).  . Back pain, chronic 2001   per MRI in 2001 - disc protrusion L5-6, to the right  . Complication of anesthesia    hard to wake up-sob-possible sleep apnea  . Depression   . Hypertension   . Hypothyroidism 2003   Thyroid US done in 2003 (results in [place]) - showed increased 24 hour uptake and the findings were consistent with Grave's disease; patient is s/p RAI therapy (09/19/2002)  . Sickle cell trait (HCC)   . Urinary frequency     Patient Active Problem List   Diagnosis Date Noted  . Hoarseness, chronic 08/16/2019  . Bunion of unspecified foot 08/16/2019  . Colon cancer screening 08/02/2019  .  Lumbago 09/09/2018  . Need for immunization against influenza 09/08/2018  . Left foot pain 04/16/2017  . Hyperlipidemia 08/26/2015  . Vitamin D deficiency 03/13/2015  . Healthcare maintenance 06/21/2014  . Hypertension 06/21/2014  . OSA (obstructive sleep apnea) 07/19/2013  . Morbidly obese (HCC) 07/19/2013  . Hypothyroidism 10/21/2006  . Depression 10/21/2006    Past Surgical History:  Procedure Laterality Date  . BLADDER SUSPENSION  2004  . CYSTOCELE REPAIR  2004   done by Dr. Su Grand  . CYSTOSCOPY  2008   revision bladder sling  . REVISION URINARY SLING  2006   sling replaced  . SHOULDER ARTHROSCOPY WITH SUBACROMIAL DECOMPRESSION Right 04/03/2015   Procedure: RIGHT SHOULDER ARTHROSCOPY WITH SUBACROMIAL DECOMPRESSION, DEBRIDEMENT ;  Surgeon: Tarry Kos, MD;  Location: [facility];  Service: Orthopedics;  Laterality: Right;  . TUBAL LIGATION       OB History    Gravida  4   Para  2   Term  1   Preterm  1   AB  2   Living  2     SAB  1   TAB  1   Ectopic      Multiple      Live Births              Family History  Problem Relation Age of Onset  . Stroke Neg Hx   . Cancer Neg Hx     Social History  Tobacco Use  . Smoking status: Never Smoker  . Smokeless tobacco: Never Used  Substance Use Topics  . Alcohol use: No    Alcohol/week: 0.0 standard drinks  . Drug use: No    Home Medications Prior to Admission medications   Medication Sig Start Date End Date Taking? Authorizing Provider  albuterol (PROVENTIL HFA) 108 (90 Base) MCG/ACT inhaler Inhale 1-2 puffs into the lungs every 6 (six) hours as needed for wheezing or shortness of breath. 08/16/19   Lanelle Bal, MD  amLODipine (NORVASC) 10 MG tablet Take 1 tablet (10 mg total) by mouth daily. 08/16/19 08/15/20  Lanelle Bal, MD  Cholecalciferol (VITAMIN D3) 10 MCG (400 UNIT) tablet Take 2 tablets (800 Units total) by mouth daily. 08/16/19   Lanelle Bal, MD    Cholecalciferol 1.25 MG (50000 UT) capsule Take 1 capsule (50,000 Units total) by mouth daily. 08/18/19   Lanelle Bal, MD  levothyroxine (SYNTHROID) 88 MCG tablet TAKE 1 TABLET (88 MCG TOTAL) BY MOUTH DAILY BEFORE BREAKFAST. 11/14/19   Lanelle Bal, MD  pantoprazole (PROTONIX) 40 MG tablet Take 1 tablet (40 mg total) by mouth daily. 08/16/19   Lanelle Bal, MD  pravastatin (PRAVACHOL) 40 MG tablet Take 1 tablet (40 mg total) by mouth every evening. 08/16/19 08/15/20  Lanelle Bal, MD  levothyroxine (SYNTHROID) 88 MCG tablet Take 1 tablet (88 mcg total) by mouth daily before breakfast. 04/03/19   Lanelle Bal, MD    Allergies    Penicillins, Advil [ibuprofen], Aspirin, Codeine, and Hydrocodone  Review of Systems   Review of Systems  All other systems reviewed and are negative.   Physical Exam Updated Vital Signs BP (!) 156/96 (BP Location: Left Arm)   Pulse 79   Temp 97.9 F (36.6 C) (Oral)   Resp (!) 22   SpO2 96%   Physical Exam Vitals and nursing note reviewed.  Constitutional:      General: She is not in acute distress.    Appearance: She is well-developed.  HENT:     Head: Atraumatic.  Eyes:     Conjunctiva/sclera: Conjunctivae normal.  Genitourinary:    Comments: Chaperone present during exam.  A rectal mass approximately 2cm in diameter was visualized at the 12 o'clock position.  It is mildly tender to palpation, and was reducible. No thrombosed hemorrhoid.  Dark color stool noted.  Normal rectal tone.  Musculoskeletal:     Cervical back: Neck supple.  Skin:    Findings: No rash.  Neurological:     Mental Status: She is alert.     ED Results / Procedures / Treatments   Labs (all labs ordered are listed, but only abnormal results are displayed) Labs Reviewed - No data to display  EKG None  Radiology No results found.  Procedures Procedures (including critical care time)  Medications Ordered in ED Medications - No data to  display  ED Course  I have reviewed the triage vital signs and the nursing notes.  Pertinent labs & imaging results that were available during my care of the patient were reviewed by me and considered in my medical decision making (see chart for details).    MDM Rules/Calculators/A&P                      BP (!) 156/96 (BP Location: Left Arm)   Pulse 79   Temp 97.9 F (36.6 C) (Oral)   Resp (!) 22   SpO2 96%   Final Clinical Impression(s) / ED Diagnoses  Final diagnoses:  External hemorrhoids    Rx / DC Orders ED Discharge Orders         Ordered    polyethylene glycol (MIRALAX / GLYCOLAX) 17 g packet  Daily     03/03/20 1847    hydrocortisone (ANUSOL-HC) 25 MG suppository  2 times daily     03/03/20 1847         6:43 PM Pt noticed a mass per rectum which started today which concerns her.  She admits to having bouts of constipation, as well as sitting on hard surface.  On exam she does have a rectal mass approximately 2 to 3 cm noted at the 12 o'clock position favoring external hemorrhoid versus rectal prolapse.  I was able to reduce the rectal mass with direct pressure.  It is not consistent with hernia.  I encourage patient to follow-up with surgery for further management.  Anusol prescribed for comfort.  Encourage avoidance of straining as it may worsen her condition.  MiraLAX prescribed as well.   Domenic Moras, PA-C 03/03/20 Clemetine Marker, MD 03/04/20 [phone]

## 2020-03-03 NOTE — ED Notes (Signed)
Patient verbalizes understanding of discharge instructions. Opportunity for questioning and answers were provided. Armband removed by staff, pt discharged from ED.  

## 2020-03-04 ENCOUNTER — Other Ambulatory Visit: Payer: Self-pay

## 2020-03-04 ENCOUNTER — Ambulatory Visit (INDEPENDENT_AMBULATORY_CARE_PROVIDER_SITE_OTHER): Payer: Self-pay | Admitting: Internal Medicine

## 2020-03-04 VITALS — BP 139/87 | HR 84 | Temp 98.2°F | Ht 61.0 in | Wt 223.1 lb

## 2020-03-04 DIAGNOSIS — K644 Residual hemorrhoidal skin tags: Secondary | ICD-10-CM | POA: Insufficient documentation

## 2020-03-04 DIAGNOSIS — I10 Essential (primary) hypertension: Secondary | ICD-10-CM

## 2020-03-04 DIAGNOSIS — Z79899 Other long term (current) drug therapy: Secondary | ICD-10-CM

## 2020-03-04 LAB — POC OCCULT BLOOD, ED: Fecal Occult Bld: POSITIVE — AB

## 2020-03-04 MED ORDER — AMLODIPINE BESYLATE 10 MG PO TABS: 10.0000 mg | ORAL_TABLET | Freq: Every day | ORAL | 3 refills | Status: DC

## 2020-03-04 MED ORDER — HYDROCORTISONE ACETATE 25 MG RE SUPP: 25.0000 mg | Freq: Two times a day (BID) | RECTAL | 0 refills | Status: DC

## 2020-03-04 MED FILL — HYDROCORTISONE ACETATE 25 M: 25 | 6 days supply | Qty: 12 | Fill #0

## 2020-03-04 MED FILL — AMLODIPINE BESYLATE 10 MG T: 10 | 30 days supply | Qty: 30 | Fill #0

## 2020-03-04 NOTE — Assessment & Plan Note (Signed)
Rectal pain: Patient presents today for evaluation of hemorrhoids versus rectal mass after being seen in the ER for the same yesterday prior to this exam.  At that time there was noted to be a 2x3 cm compressible somewhat painful rectal mass felt less likely to be a hemorrhoid or herniation.  Possibility of prolapse is also discussed.  Today on exam there was a 1.4x0.8cm external hemorrhoid. No evidence of rectal prolapse.   Plan: Patient does not have insurance and wishes to pursue conservative management Recommended sitz bath, hydrocortisone topical, was prescribed suppositories in the ED High fiber diet No straining with bowel movements Return precautions given  Consider referral to GI if this does not resolve

## 2020-03-04 NOTE — Progress Notes (Signed)
   CC: hemorrhoids   HPI:Shannon Newton is a 54 y.o. female who presents for evaluation of hemorrhoids. Please see individual problem based A/P for details.  Past Medical History:  Diagnosis Date  . Abdominal pain, recurrent 2009   per CT of the abd/pelvis (01/26/2008) -  Diffuse inflammatory change of the descending and rectosigmoid colon.  Additional inflammatory changes within the terminal ileum raise concern for inflammatory bowel disease (Crohn's disease).  . Back pain, chronic 2001   per MRI in 2001 - disc protrusion L5-6, to the right  . Complication of anesthesia    hard to wake up-sob-possible sleep apnea  . Depression   . Hypertension   . Hypothyroidism 2003   Thyroid US done in 2003 (results in [place]) - showed increased 24 hour uptake and the findings were consistent with Grave's disease; patient is s/p RAI therapy (09/19/2002)  . Sickle cell trait (HCC)   . Urinary frequency    Review of Systems:  ROS negative except as per HPI.  Physical Exam: Vitals:   03/04/20 1452 03/04/20 1453  BP:  139/87  Pulse:  84  Temp:  98.2 F (36.8 C)  TempSrc:  Oral  SpO2:  100%  Weight: 223 lb 1.6 oz (101.2 kg)   Height: 5\' 1"  (1.549 m)    Filed Weights   03/04/20 1452  Weight: 223 lb 1.6 oz (101.2 kg)   General: A/O x4, in no acute distress, afebrile, nondiaphoretic HEENT: PEERL, EMO intact GI: There is a 1.4x0.8cm nonbleeding painful external hemorrhoid against the right side of the rectum that is compressible  Psych: Appropriate affect, not depressed in appearance, engages well  Assessment & Plan:   See Encounters Tab for problem based charting.  Patient discussed with Dr. 03/06/20

## 2020-03-04 NOTE — Assessment & Plan Note (Signed)
Hypertension: Patient's BP today is 139/87 with a goal of <140/80. The patient endorses adherence to her medication regimen. She denied, chest pain, headache, visual changes, lightheadedness, weakness, dizziness on standing, swelling in the feet or ankles.   Plan: Continue Amlodipine 10mg  daily

## 2020-03-04 NOTE — Patient Instructions (Signed)
FOLLOW-UP INSTRUCTIONS When: 2-6 wks for follow-up with your PCP What to bring: All of your medications  Today we discussed your hemorrhoids. I would like to begin therapy with a conservative approach.  I recommend continuing the hydrocortisone suppositories.  Additionally, I recommend a warm bath up to 3 times daily.  We highly advise that you consume a high-fiber diet at least 30 to 40 g of fiber daily. This will help make certain your stool is loose.  No forced bowel movements or bearing down during bowel movements.  Please make certain that your stools are soft and or loose.  If not I recommend a stool softener or laxative.  Thank you for your visit to the [facility] today. If you have any questions or concerns please call us at [phone].    Hemorrhoids Hemorrhoids are swollen veins that may develop:  In the butt (rectum). These are called internal hemorrhoids.  Around the opening of the butt (anus). These are called external hemorrhoids. Hemorrhoids can cause pain, itching, or bleeding. Most of the time, they do not cause serious problems. They usually get better with diet changes, lifestyle changes, and other home treatments. What are the causes? This condition may be caused by:  Having trouble pooping (constipation).  Pushing hard (straining) to poop.  Watery poop (diarrhea).  Pregnancy.  Being very overweight (obese).  Sitting for long periods of time.  Heavy lifting or other activity that causes you to strain.  Anal sex.  Riding a bike for a long period of time. What are the signs or symptoms? Symptoms of this condition include:  Pain.  Itching or soreness in the butt.  Bleeding from the butt.  Leaking poop.  Swelling in the area.  One or more lumps around the opening of your butt. How is this diagnosed? A doctor can often diagnose this condition by looking at the affected area. The doctor may also:  Do an exam that involves feeling the area with  a gloved hand (digital rectal exam).  Examine the area inside your butt using a small tube (anoscope).  Order blood tests. This may be done if you have lost a lot of blood.  Have you get a test that involves looking inside the colon using a flexible tube with a camera on the end (sigmoidoscopy or colonoscopy). How is this treated? This condition can usually be treated at home. Your doctor may tell you to change what you eat, make lifestyle changes, or try home treatments. If these do not help, procedures can be done to remove the hemorrhoids or make them smaller. These may involve:  Placing rubber bands at the base of the hemorrhoids to cut off their blood supply.  Injecting medicine into the hemorrhoids to shrink them.  Shining a type of light energy onto the hemorrhoids to cause them to fall off.  Doing surgery to remove the hemorrhoids or cut off their blood supply. Follow these instructions at home: Eating and drinking   Eat foods that have a lot of fiber in them. These include whole grains, beans, nuts, fruits, and vegetables.  Ask your doctor about taking products that have added fiber (fibersupplements).  Reduce the amount of fat in your diet. You can do this by: ? Eating low-fat dairy products. ? Eating less red meat. ? Avoiding processed foods.  Drink enough fluid to keep your pee (urine) pale yellow. Managing pain and swelling   Take a warm-water bath (sitz bath) for 20 minutes to ease pain.  Do this 3-4 times a day. You may do this in a bathtub or using a portable sitz bath that fits over the toilet.  If told, put ice on the painful area. It may be helpful to use ice between your warm baths. ? Put ice in a plastic bag. ? Place a towel between your skin and the bag. ? Leave the ice on for 20 minutes, 2-3 times a day. General instructions  Take over-the-counter and prescription medicines only as told by your doctor. ? Medicated creams and medicines may be used as  told.  Exercise often. Ask your doctor how much and what kind of exercise is best for you.  Go to the bathroom when you have the urge to poop. Do not wait.  Avoid pushing too hard when you poop.  Keep your butt dry and clean. Use wet toilet paper or moist towelettes after pooping.  Do not sit on the toilet for a long time.  Keep all follow-up visits as told by your doctor. This is important. Contact a doctor if you:  Have pain and swelling that do not get better with treatment or medicine.  Have trouble pooping.  Cannot poop.  Have pain or swelling outside the area of the hemorrhoids. Get help right away if you have:  Bleeding that will not stop. Summary  Hemorrhoids are swollen veins in the butt or around the opening of the butt.  They can cause pain, itching, or bleeding.  Eat foods that have a lot of fiber in them. These include whole grains, beans, nuts, fruits, and vegetables.  Take a warm-water bath (sitz bath) for 20 minutes to ease pain. Do this 3-4 times a day. This information is not intended to replace advice given to you by your health care provider. Make sure you discuss any questions you have with your health care provider. Document Revised: 12/08/2018 Document Reviewed: 04/21/2018 Elsevier Patient Education  2020 [employer].

## 2020-03-05 NOTE — Progress Notes (Signed)
Internal Medicine Clinic Attending  Case discussed with Dr. Harbrecht at the time of the visit.  We reviewed the resident's history and exam and pertinent patient test results.  I agree with the assessment, diagnosis, and plan of care documented in the resident's note.   

## 2020-03-13 ENCOUNTER — Other Ambulatory Visit: Payer: Self-pay | Admitting: *Deleted

## 2020-03-13 DIAGNOSIS — K644 Residual hemorrhoidal skin tags: Secondary | ICD-10-CM

## 2020-03-13 DIAGNOSIS — R490 Dysphonia: Secondary | ICD-10-CM

## 2020-03-13 MED ORDER — HYDROCORTISONE ACETATE 25 MG RE SUPP: 25.0000 mg | Freq: Two times a day (BID) | RECTAL | 0 refills | Status: DC

## 2020-03-27 ENCOUNTER — Ambulatory Visit: Payer: Self-pay

## 2020-03-27 ENCOUNTER — Telehealth: Payer: Self-pay | Admitting: *Deleted

## 2020-03-27 NOTE — Telephone Encounter (Signed)
Call to patient about missed appointment.  Message was left to call Clinics to reschedule.  Angelina Ok, RN 03/27/2020 4:11 PM.

## 2020-03-28 ENCOUNTER — Encounter: Payer: Self-pay | Admitting: Internal Medicine

## 2020-04-15 ENCOUNTER — Encounter: Payer: Self-pay | Admitting: *Deleted

## 2020-09-02 ENCOUNTER — Other Ambulatory Visit: Payer: Self-pay

## 2020-09-02 ENCOUNTER — Encounter: Payer: Self-pay | Admitting: Student

## 2020-09-02 ENCOUNTER — Encounter: Payer: Self-pay | Admitting: Internal Medicine

## 2020-09-02 ENCOUNTER — Other Ambulatory Visit (HOSPITAL_COMMUNITY): Payer: Self-pay | Admitting: Student

## 2020-09-02 ENCOUNTER — Ambulatory Visit: Payer: Self-pay | Admitting: Student

## 2020-09-02 VITALS — BP 131/78 | HR 90 | Temp 98.4°F | Ht 61.0 in | Wt 228.5 lb

## 2020-09-02 DIAGNOSIS — I10 Essential (primary) hypertension: Secondary | ICD-10-CM

## 2020-09-02 DIAGNOSIS — E039 Hypothyroidism, unspecified: Secondary | ICD-10-CM

## 2020-09-02 DIAGNOSIS — Z0000 Encounter for general adult medical examination without abnormal findings: Secondary | ICD-10-CM

## 2020-09-02 DIAGNOSIS — Z0001 Encounter for general adult medical examination with abnormal findings: Secondary | ICD-10-CM

## 2020-09-02 DIAGNOSIS — R490 Dysphonia: Secondary | ICD-10-CM

## 2020-09-02 DIAGNOSIS — E559 Vitamin D deficiency, unspecified: Secondary | ICD-10-CM

## 2020-09-02 DIAGNOSIS — Z23 Encounter for immunization: Secondary | ICD-10-CM

## 2020-09-02 DIAGNOSIS — E785 Hyperlipidemia, unspecified: Secondary | ICD-10-CM

## 2020-09-02 DIAGNOSIS — R739 Hyperglycemia, unspecified: Secondary | ICD-10-CM

## 2020-09-02 MED ORDER — ALBUTEROL SULFATE HFA 108 (90 BASE) MCG/ACT IN AERS: 1.0000 | INHALATION_SPRAY | Freq: Four times a day (QID) | RESPIRATORY_TRACT | 1 refills | Status: DC | PRN

## 2020-09-02 MED ORDER — AMLODIPINE BESYLATE 5 MG PO TABS: 5.0000 mg | ORAL_TABLET | Freq: Every day | ORAL | 0 refills | Status: DC

## 2020-09-02 MED ORDER — PANTOPRAZOLE SODIUM 40 MG PO TBEC: 40.0000 mg | DELAYED_RELEASE_TABLET | Freq: Every day | ORAL | 2 refills | Status: DC

## 2020-09-02 MED ORDER — LEVOTHYROXINE SODIUM 88 MCG PO TABS: 88.0000 ug | ORAL_TABLET | Freq: Every day | ORAL | 1 refills | Status: DC

## 2020-09-02 MED ORDER — PRAVASTATIN SODIUM 40 MG PO TABS: 40.0000 mg | ORAL_TABLET | Freq: Every evening | ORAL | 3 refills | Status: DC

## 2020-09-02 MED ORDER — VITAMIN D3 10 MCG (400 UNIT) PO TABS: 800.0000 [IU] | ORAL_TABLET | Freq: Every day | ORAL | 11 refills | Status: DC

## 2020-09-02 MED FILL — PANTOPRAZOLE SOD DR 40 MG T: 40 | 30 days supply | Qty: 30 | Fill #0

## 2020-09-02 MED FILL — PROAIR HFA 90 MCG INHALER: 108 (90 BAS | 25 days supply | Qty: 9 | Fill #0

## 2020-09-02 MED FILL — PRAVASTATIN NA 40 MG TAB: 40 | 30 days supply | Qty: 30 | Fill #0

## 2020-09-02 MED FILL — AMLODIPINE BESYLATE 5 MG TA: 5 | 30 days supply | Qty: 30 | Fill #0

## 2020-09-02 MED FILL — LEVOTHYROXINE 88 MCG TABLET: 88 | 30 days supply | Qty: 30 | Fill #0

## 2020-09-02 NOTE — Patient Instructions (Addendum)
Shannon Newton,   Thank you for your visit to the [facility] today. It was a pleasure meeting you. Today we discussed the following:  1) High blood pressure:  - Your blood pressure was excellent today at 131/78 - Since it has been some time since you last took your amlodipine, I will send in a prescription for amlodipine 5 mg daily  2) Health maintenance: - I provided information for you to get your FREE mobile cancer screenings - colon cancer screening & breast cancer screening - You are due for cervical cancer screening. We will bring you back for a pap smear when you have insurance. - Today you received your flu vaccine - COVID-19 Vaccine: We highly recommend getting vaccinated against COVID-19.  - The vaccines are SAFE.They went through all the required stages of clinical trials, and extensive testing and monitoring have shown that these vaccines are safe and effective. - The vaccines are EFFECTIVE. They lower your chances of getting and spreading the virus. They are proven to reduce your chance of severe illness, hospitalization, and death from COVID-19. - Getting vaccinated protects you, your loved ones, and people at risk for severe illness from COVID-19. - You can get vaccinated at any major pharmacy. If you are interested in setting up an appointment to get vaccinated through [facility], you can call the [facility] COVID-19 hotline at [phone].  3) Medication refills - I have sent refills of your medications to the [facility] $4 program. If any of the medications I sent are not covered, just pick up the ones on the list and give our clinic a call so that we can get you help paying for the other medications.  4) Financial counseling - You received information for our financial counselor, Fredderick Phenix. Call her to set up an appointment. Bring the documents/information on the sheet provided. - Once you have insurance, we will get some additional lab  work, including basic metabolic panel, hemoglobin A1c, TSH, and vit D level.  5) Hoarseness - I have sent a prescription for pantoprazole 40 mg to the pharmacy. We will treat you for acid reflux and see how you respond. - If it doesn't help, and after you have insurance, we will send in a referral to ENT.   We would like to see you back in 2-3 months for follow-up and a pap smear. Please bring all of your medications with you.   If you have any questions or concerns, please call our clinic at [phone] between 9am-5pm. Outside of these hours, call [phone] and ask for the internal medicine resident on call. If you feel you are having a medical emergency please call 911.

## 2020-09-02 NOTE — Assessment & Plan Note (Signed)
Administered today.

## 2020-09-02 NOTE — Assessment & Plan Note (Addendum)
Patient's BP today is 131/78. She reports she has not taken her amlodipine 10 mg for the last few months since she ran out of the prescription.  She reports an episode of elevated BP at work 3 days ago on Friday, 08/30/20. She states she was in her usual state of health until around 7:30am that morning when she was sitting at her desk and noticed pain in the upper portion of her R leg. The pain was a dull ache and radiated down her anterior thigh. She stood up and walked around to see if the pain improved, and it did, slightly. She sat back down and a few minutes later the pain returned, and she also noticed she felt lightheaded and observed new floaters in her vision. She stood up to notify her supervisor she wasn't feeling well and felt weakness in her R leg. Someone had her sit down and got a BP cuff to measure her BP which she reports was 240/120 at the time. EMS was called and evaluated her at the office. Her repeat blood pressure was "160/something," her blood sugar was in the 80s, and her vitals were otherwise fine. EMS did not want to take her to the ED because of long wait times due to COVID. The patient reports she returned home and "took it easy" and has been feeling tired, though otherwise back to baseline since.  She denies recent shortness of breath, fever, chills, other infectious symptoms.  She denies taking any new medications.  She denies pain, weakness, or swelling in her legs.  She reports feeling increased anxiety at the time of the leg pain because she worried she may have a DVT.  A/P: The patient's blood pressure is at goal today.  She reports she has not been taking her amlodipine 10 mg since she ran out of the prescription.  Suspect her episode of elevated blood pressure was due to anxiety and/or pain, reassured by its return to normal.  She reports her blood pressures are typically around 160 systolic, though she does not measure at home.  Since it has been sometime since she took her  amlodipine 10 mg, we will start her back on 5 mg. - Amlodipine 5 mg to [facility] pharmacy (IM program) - Recommend patient get a BP cuff to monitor her pressure at home - BMP at next visit (held off today as patient is self-pay)  Follow-up in 2-3 months.

## 2020-09-02 NOTE — Progress Notes (Signed)
   CC: episode of high blood pressure at work  HPI:  Shannon Newton is a 54 y.o. woman with history of HTN, hypothyroidism, vitamin D deficiency, external hemorrhoids who presents to clinic after an episode of high blood pressure at work as well as medication refills. Her last clinic visit was on 03/04/20.   Patient is self-pay and has had difficulty affording medications in the past. Today we focused on refilling her medications through the [facility] OP pharmacy IM clinic program. Will defer labs (BMP, TSH, A1c, Vit D) and ENT referral until the patient meets with our financial counselors and has coverage.  To see the details of this patient's management of their acute and chronic problems, please refer to the Assessment & Plan under the Encounters tab.    Past Medical History:  Diagnosis Date  . Abdominal pain, recurrent 2009   per CT of the abd/pelvis (01/26/2008) -  Diffuse inflammatory change of the descending and rectosigmoid colon.  Additional inflammatory changes within the terminal ileum raise concern for inflammatory bowel disease (Crohn's disease).  . Back pain, chronic 2001   per MRI in 2001 - disc protrusion L5-6, to the right  . Complication of anesthesia    hard to wake up-sob-possible sleep apnea  . Depression   . Hypertension   . Hypothyroidism 2003   Thyroid US done in 2003 (results in [place]) - showed increased 24 hour uptake and the findings were consistent with Grave's disease; patient is s/p RAI therapy (09/19/2002)  . Sickle cell trait (HCC)   . Urinary frequency    Review of Systems:    Review of Systems  Constitutional: Negative for chills, fever and malaise/fatigue.  HENT: Negative for congestion, sinus pain and sore throat.   Eyes: Negative for blurred vision.  Respiratory: Negative for cough and shortness of breath.   Cardiovascular: Negative for chest pain and leg swelling.  Gastrointestinal: Negative for heartburn, nausea and vomiting.  Musculoskeletal:  Negative for falls and myalgias.  Neurological: Negative for dizziness, focal weakness, weakness and headaches.    Physical Exam:  Vitals:   09/02/20 1551  BP: 131/78  Pulse: 90  Temp: 98.4 F (36.9 C)  TempSrc: Oral  SpO2: 99%  Weight: 228 lb 8 oz (103.6 kg)  Height: 5\' 1"  (1.549 m)   Constitutional: well-appearing woman sitting in chair, in no acute distress, voice is hoarse HENT: normocephalic atraumatic Eyes: conjunctiva non-erythematous Neck: supple Cardiovascular: regular rate and rhythm, no m/r/g Pulmonary/Chest: normal work of breathing on room air, clear to auscultation bilaterally MSK: normal bulk and tone Neurological: A&Ox3, 5/5 strength throughout, normal gait   Assessment & Plan:   See Encounters Tab for problem based charting.  Patient seen with Dr. 

## 2020-09-02 NOTE — Assessment & Plan Note (Signed)
Patient reports persistent intermittent hoarseness, unchanged from prior.  Patient reports she did not see improvement with course of PPI blocker, however she may not have taken daily.  ENT referral placed previously, however patient did not schedule.   A/P: Suspect could be due to GERD, though patient denies reflux symptoms. Will treat again with course of PPI, however ultimately patient likely needs ENT referral which is difficult as she is self-pay. - pantoprazole 40 mg daily sent to [facility] pharmacy (IM program) - ENT referral once patient has insurance

## 2020-09-02 NOTE — Assessment & Plan Note (Signed)
Vit D 10.7 on 08/16/19 and was ordered 50k q7 days for 8 doses. Patient reports she took this.   Would like to check Vit D level today, however will hold off as patient is self-pay.  - cholecalciferol sent to [facility] OP pharmacy (IM program) - Vit D level once patient has insurance

## 2020-09-02 NOTE — Assessment & Plan Note (Signed)
Refilled pravastatin to [facility] OP pharmacy (IM program)

## 2020-09-02 NOTE — Assessment & Plan Note (Signed)
Patient had A1c of 5.9 two years ago on 09/07/18. Would like to repeat A1c, however patient is self-pay.  - A1c once patient has insurance - Discussed importance of healthy eating, including strategies such as incorporating more vegetables, fruits, and lean proteins, while cutting down on sugary beverages, sweet treats, and processed foods - Discussed goal of 150 to 300 minutes (2  to 5 hours) a week of moderate-intensity activity such as brisk walking

## 2020-09-02 NOTE — Assessment & Plan Note (Signed)
-   Flu shot today - Patient states she would like to get her COVID-19 vaccine. Information for obtaining vaccine provided. - Patient given information for FREE mobile cancer screenings. She is due for colon cancer screening (has never had colonoscopy or Fit testing) and mammography (last mammogram 02/2015). She shares that she is worried about colon cancer screening since she does not want to find out that she has colon cancer. - Patient is due for cervical cancer screening/pap smear (last done 08/26/15). Hopefully she can get insurance and have this done at her next visit.

## 2020-09-02 NOTE — Assessment & Plan Note (Signed)
TSH from 08/16/19 wnl at 2.33. Patient was continued on her levothyroxine 88 mcg daily dose, which she reports she has been adherent to.   A/P: - TSH once patient has insurance - Continue levothyroxine 88 mcg daily (refill sent to [facility] OP pharmacy IM program)

## 2020-09-04 NOTE — Progress Notes (Signed)
Internal Medicine Clinic Attending  I saw and evaluated the patient.  I personally confirmed the key portions of the history and exam documented by Dr. Watson and I reviewed pertinent patient test results.  The assessment, diagnosis, and plan were formulated together and I agree with the documentation in the resident's note.  

## 2020-09-12 NOTE — Addendum Note (Signed)
Addended by: Earl Lagos on: 09/12/2020 10:25 AM   Modules accepted: Level of Service

## 2020-10-16 MED FILL — LEVOTHYROXINE 88 MCG TABLET: 88 | 30 days supply | Qty: 30 | Fill #1

## 2020-10-16 MED FILL — PROAIR HFA 90 MCG INHALER: 108 (90 BAS | 25 days supply | Qty: 9 | Fill #1

## 2020-10-16 MED FILL — AMLODIPINE BESYLATE 5 MG TA: 5 | 30 days supply | Qty: 30 | Fill #1

## 2020-10-16 MED FILL — PRAVASTATIN NA 40 MG TAB: 40 | 30 days supply | Qty: 30 | Fill #1

## 2020-10-16 MED FILL — PANTOPRAZOLE SOD DR 40 MG T: 40 | 30 days supply | Qty: 30 | Fill #1

## 2020-12-12 MED FILL — LEVOTHYROXINE 88 MCG TABLET: 88 | 30 days supply | Qty: 30 | Fill #2

## 2020-12-12 MED FILL — PANTOPRAZOLE SOD DR 40 MG T: 40 | 30 days supply | Qty: 30 | Fill #2

## 2021-02-20 ENCOUNTER — Other Ambulatory Visit (HOSPITAL_COMMUNITY): Payer: Self-pay | Admitting: Internal Medicine

## 2021-02-20 ENCOUNTER — Other Ambulatory Visit: Payer: Self-pay | Admitting: Student

## 2021-02-20 DIAGNOSIS — R490 Dysphonia: Secondary | ICD-10-CM

## 2021-02-20 MED FILL — PROAIR HFA 90 MCG INHALER: 108 (90 BAS | 25 days supply | Qty: 9 | Fill #0

## 2021-02-20 MED FILL — PRAVASTATIN NA 40 MG TAB: 40 | 30 days supply | Qty: 30 | Fill #2

## 2021-02-20 MED FILL — AMLODIPINE BESYLATE 5 MG TA: 5 | 30 days supply | Qty: 30 | Fill #2

## 2021-02-20 MED FILL — PANTOPRAZOLE SOD DR 40 MG T: 40 | 30 days supply | Qty: 30 | Fill #2

## 2021-02-20 MED FILL — LEVOTHYROXINE 88 MCG TABLET: 88 | 30 days supply | Qty: 30 | Fill #2

## 2021-05-10 ENCOUNTER — Encounter: Payer: Self-pay | Admitting: *Deleted

## 2021-06-03 ENCOUNTER — Other Ambulatory Visit (HOSPITAL_COMMUNITY): Payer: Self-pay

## 2021-06-03 ENCOUNTER — Telehealth: Payer: Self-pay

## 2021-06-03 MED FILL — Levothyroxine Sodium Tab 88 MCG: ORAL | 30 days supply | Qty: 30 | Fill #0 | Status: CN

## 2021-06-03 NOTE — Telephone Encounter (Signed)
RTC, patient c/o depression for some time now, states she has taken Lexapro in the past and would like to try medication again.  She denies any suicidal ideation.  She also c/o right shoulder pain , chronic, states it is from an old injury which she had a rotator cuff repair approx 4-5 years ago.  Appt made w/ blue team, Dr. Ephriam Knuckles on 06/04/21 @ 2:15 (Dr. Claudette Laws full the rest of the week). SChaplin, RN,BSN

## 2021-06-03 NOTE — Telephone Encounter (Signed)
Pt is requesting a call back ...about getting back on her medication for depression and pain management

## 2021-06-04 ENCOUNTER — Ambulatory Visit (INDEPENDENT_AMBULATORY_CARE_PROVIDER_SITE_OTHER): Payer: Self-pay | Admitting: Internal Medicine

## 2021-06-04 ENCOUNTER — Other Ambulatory Visit (HOSPITAL_COMMUNITY): Payer: Self-pay

## 2021-06-04 ENCOUNTER — Other Ambulatory Visit: Payer: Self-pay

## 2021-06-04 VITALS — BP 160/107 | HR 73 | Temp 98.4°F | Ht 61.0 in | Wt 224.2 lb

## 2021-06-04 DIAGNOSIS — I10 Essential (primary) hypertension: Secondary | ICD-10-CM

## 2021-06-04 DIAGNOSIS — M778 Other enthesopathies, not elsewhere classified: Secondary | ICD-10-CM

## 2021-06-04 DIAGNOSIS — Z0000 Encounter for general adult medical examination without abnormal findings: Secondary | ICD-10-CM

## 2021-06-04 DIAGNOSIS — F3341 Major depressive disorder, recurrent, in partial remission: Secondary | ICD-10-CM

## 2021-06-04 MED ORDER — ESCITALOPRAM OXALATE 10 MG PO TABS
10.0000 mg | ORAL_TABLET | Freq: Every day | ORAL | 2 refills | Status: DC
  Filled 2021-06-04 – 2021-10-10 (×3): qty 30, 30d supply, fill #0
  Filled 2022-04-07: qty 30, 30d supply, fill #1

## 2021-06-05 ENCOUNTER — Ambulatory Visit: Payer: Self-pay | Admitting: Licensed Clinical Social Worker

## 2021-06-05 DIAGNOSIS — M778 Other enthesopathies, not elsewhere classified: Secondary | ICD-10-CM | POA: Insufficient documentation

## 2021-06-05 NOTE — Assessment & Plan Note (Signed)
She received the information for free mammogram screening.  Financial restraints precluding other health maintinence issues.

## 2021-06-05 NOTE — Assessment & Plan Note (Signed)
Blood pressure is above goal in the office today--160/107. It has been better controlled at her most recent visits. She notes that she has not taken her medication for the day yet.  -will have her monitor her blood pressure at home. Explained that if it remains elevated at future visits, we may need to discuss escalation of medications.

## 2021-06-05 NOTE — Assessment & Plan Note (Signed)
PHQ 9 score is 21 today. She largely related this to current social issues. Previously on lexapro which she felt worked well. Denies SI. -resume lexapro at $RemoveBe'10mg'KRpukkGrm$  -f/u via telehealth in 6-8w at which time she can consider an increase to $RemoveBef'20mg'WqLVQmlUdn$  if symptoms remain poorly controlled -CCM referral. Met with Wyatt Portela, SW today

## 2021-06-05 NOTE — Progress Notes (Signed)
 Office Visit   Patient ID: Shannon Newton, female    DOB: 04/21/1966, 54 y.o.   MRN: [id number]   PCP: Watson, Julia, MD   Subjective:  Shannon Newton is a 54 y.o. year old female who presents for evaluation of right shoulder pain and depression. Please refer to problem based charting for assessment and plan.  Objective:   BP (!) 160/107 (BP Location: Left Arm, Patient Position: Sitting, Cuff Size: Normal)   Pulse 73   Temp 98.4 F (36.9 C) (Oral)   Ht 5' 1" (1.549 m)   Wt 224 lb 3.2 oz (101.7 kg)   SpO2 97%   BMI 42.36 kg/m  Wt Readings from Last 3 Encounters:  06/04/21 224 lb 3.2 oz (101.7 kg)  09/02/20 228 lb 8 oz (103.6 kg)  03/04/20 223 lb 1.6 oz (101.2 kg)   BP Readings from Last 3 Encounters:  06/04/21 (!) 160/107  09/02/20 131/78  03/04/20 139/87   Right shoulder: no obvious deformity. No increased warmth or erythema. Point tenderness over the subacromial process. ROM is intact although does illicit discomfort Psych: normal affect. Denies SI.  Assessment & Plan:   Problem List Items Addressed This Visit       Cardiovascular and Mediastinum   Hypertension    Blood pressure is above goal in the office today--160/107. It has been better controlled at her most recent visits. She notes that she has not taken her medication for the day yet.  -will have her monitor her blood pressure at home. Explained that if it remains elevated at future visits, we may need to discuss escalation of medications.         Musculoskeletal and Integument   Subacromial tendinitis of right shoulder    Presented today for evaluation of right shoulder pain.  Hx of rotator cuff repair in 2016 Pain began after starting a new job that involved repetitively lifting objects above her head. She has since quit that job. Since the pain has not improved over the past 2 weeks, she decided to seek further evaluation in the office.   Exam significant for point tenderness over the subacomial tendon.  ROM remains intact however is fairly uncomfortable for her. No evidence of septic joint.  Assessment: tendonitis and/or bursitis of the right shoulder, most likely subacromial  Would likely benefit from a steroid injection however she will need to return on a day that one of the attendings who do these are in the clinic. She is agreeable to returning at that time.  Plan -f/u at pt's earliest convenience and when an attending who performs injections is available to precept in the clinic -continue supportive management         Other   Depression - Primary    PHQ 9 score is 21 today. She largely related this to current social issues. Previously on lexapro which she felt worked well. Denies SI. -resume lexapro at 10mg -f/u via telehealth in 6-8w at which time she can consider an increase to 20mg if symptoms remain poorly controlled -CCM referral. Met with Bianca, SW today       Relevant Medications   escitalopram (LEXAPRO) 10 MG tablet   Other Relevant Orders   AMB Referral to Community Care Coordinaton   Healthcare maintenance    She received the information for free mammogram screening.  Financial restraints precluding other health maintinence issues.          Return in about 1 week (around 06/11/2021), or a shoulder   injection.   Pt discussed with Dr. Williams   , MD Internal Medicine Resident PGY-2 [place] [facility] Pager: #[phone] 06/05/2021 4:57 PM     
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
## 2021-06-05 NOTE — Patient Instructions (Signed)
Visit Information  Instructions: patient will work with SW to address concerns related to Housing, Arts administrator, employment.   Patient was given the following information about care management and care coordination services today, agreed to services, and gave verbal consent: 1.care management/care coordination services include personalized support from designated clinical staff supervised by their physician, including individualized plan of care and coordination with other care providers 2. 24/7 contact phone numbers for assistance for urgent and routine care needs. 3. The patient may stop care management/care coordination services at any time by phone call to the office staff.  Patient verbalizes understanding of instructions provided today and agrees to view in MyChart.   Telephone follow up appointment with care management team member scheduled for: Next 30 days  Christen Butter, [place]  Social Worker [facility] Care Management  [phone]

## 2021-06-05 NOTE — Assessment & Plan Note (Addendum)
Presented today for evaluation of right shoulder pain.  Hx of rotator cuff repair in 2016 Pain began after starting a new job that involved repetitively lifting objects above her head. She has since quit that job. Since the pain has not improved over the past 2 weeks, she decided to seek further evaluation in the office.   Exam significant for point tenderness over the subacomial tendon. ROM remains intact however is fairly uncomfortable for her. No evidence of septic joint.  Assessment: tendonitis and/or bursitis of the right shoulder, most likely subacromial  Would likely benefit from a steroid injection however she will need to return on a day that one of the attendings who do these are in the clinic. She is agreeable to returning at that time.  Plan -f/u at pt's earliest convenience and when an attending who performs injections is available to precept in the clinic -continue supportive management

## 2021-06-11 ENCOUNTER — Other Ambulatory Visit (HOSPITAL_COMMUNITY): Payer: Self-pay

## 2021-06-17 NOTE — Progress Notes (Signed)
Internal Medicine Clinic Attending  Case discussed with Dr. Ephriam Knuckles  At the time of the visit.  We reviewed the resident's history and exam and pertinent patient test results.  I agree with the assessment, diagnosis, and plan of care documented in the resident's note; though the tendon inferior to the acromion is the long head of the biceps, where there could be either impingement and associate tendinopathy, or subacromial bursitis as noted.

## 2021-07-01 ENCOUNTER — Ambulatory Visit: Payer: Self-pay | Admitting: Behavioral Health

## 2021-07-01 DIAGNOSIS — F4323 Adjustment disorder with mixed anxiety and depressed mood: Secondary | ICD-10-CM

## 2021-07-10 ENCOUNTER — Ambulatory Visit: Payer: Self-pay | Admitting: Licensed Clinical Social Worker

## 2021-07-10 NOTE — Chronic Care Management (AMB) (Signed)
  Care Management   Social Work Visit Note  07/10/2021 Name: Shannon Newton MRN: [id number] DOB: August 17, 1966  Shannon Newton is a 55 y.o. year old female who sees Dellis Filbert, MD for primary care. The care management team was consulted for assistance with care management and care coordination needs related to [facility]    Patient was given the following information about care management and care coordination services today, agreed to services, and gave verbal consent: 1.care management/care coordination services include personalized support from designated clinical staff supervised by their physician, including individualized plan of care and coordination with other care providers 2. 24/7 contact phone numbers for assistance for urgent and routine care needs. 3. The patient may stop care management/care coordination services at any time by phone call to the office staff.  Engaged with patient by telephone for follow up visit in response to provider referral for social work chronic care management and care coordination services.  Assessment: Review of patient history, allergies, and health status during evaluation of patient need for care management/care coordination services.    Interventions:  Patient interviewed and appropriate assessments performed Collaborated with clinical team regarding patient needs  Patient has moved into residence with family. Patient reports her living situation being a positive change. Patient is now residing with her brother and his wife.  Patient will start new employment soon.  Patient requested assistance with food. SW educated patient on local food banks and applying for FNS EBT. SW emailed listing of food banks and local pantries to [email or web address] ( patients e-mail) per patients request. Patient was in good spirits and excited about the changes made to her living situation.  SW reminded patient of appointment with Dr. Monna Fam on 08/16 at 1  pm.       Plan:  patient will work with BSW to address needs related to Financial constraints  SW will follow up with patient within 30 days.  Christen Butter, BSW  Social Worker [facility] Care Management  [phone]

## 2021-07-10 NOTE — Patient Instructions (Signed)
Visit Information  Instructions: patient will work with SW to address concerns related to financial constraints.  Patient was given the following information about care management and care coordination services today, agreed to services, and gave verbal consent: 1.care management/care coordination services include personalized support from designated clinical staff supervised by their physician, including individualized plan of care and coordination with other care providers 2. 24/7 contact phone numbers for assistance for urgent and routine care needs. 3. The patient may stop care management/care coordination services at any time by phone call to the office staff.  Patient verbalizes understanding of instructions provided today and agrees to view in MyChart.   The care management team will reach out to the patient again over the next 30 days.   Aariah Godette, BSW  Social Worker [facility] Care Management  [phone]      
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
## 2021-07-29 ENCOUNTER — Ambulatory Visit: Payer: Self-pay | Admitting: Behavioral Health

## 2021-08-12 ENCOUNTER — Ambulatory Visit: Payer: Self-pay | Admitting: Licensed Clinical Social Worker

## 2021-08-13 NOTE — Chronic Care Management (AMB) (Signed)
  Care Management   Social Work Visit Note  08/13/2021 Name: GIMENA BUICK MRN: [id number] DOB: 1966-07-23  [name] is a 55 y.o. year old female who sees Dellis Filbert, MD for primary care. The care management team was consulted for assistance with care management and care coordination needs related to [employer]     SW attempted patient and patients sister-Lori Mcdowell. Sw left a VM.   SW reviewed chart to determine any needs. SW will attempt patient again within 30-days.  Christen Butter, BSW  Social Worker [facility] Care Management  [phone]

## 2021-08-25 ENCOUNTER — Telehealth: Payer: Self-pay | Admitting: Behavioral Health

## 2021-08-25 ENCOUNTER — Ambulatory Visit: Payer: Self-pay | Admitting: Behavioral Health

## 2021-08-25 NOTE — Telephone Encounter (Signed)
Contacted Pt twice for today's 2:30pm telehealth visit. Lft msg for Pt to [facility] to [facility] to r/s appt @ her convenience.  Dr. Monna Fam

## 2021-09-10 ENCOUNTER — Ambulatory Visit: Payer: Self-pay | Admitting: Behavioral Health

## 2021-09-10 ENCOUNTER — Telehealth: Payer: Self-pay | Admitting: Behavioral Health

## 2021-09-10 NOTE — Telephone Encounter (Signed)
Attempted to contact Pt for 1:00pm [facility] telehealth session 3 times. Lft msg for Pt about today's visit & requested she call [facility] to r/s @ her convenience.  Dr. Monna Fam

## 2021-10-02 ENCOUNTER — Ambulatory Visit: Payer: Self-pay | Admitting: Licensed Clinical Social Worker

## 2021-10-02 ENCOUNTER — Telehealth: Payer: Self-pay | Admitting: Behavioral Health

## 2021-10-02 ENCOUNTER — Ambulatory Visit: Payer: Self-pay | Admitting: Behavioral Health

## 2021-10-02 NOTE — Telephone Encounter (Signed)
Pt is currently homeless, living in her car. She has secured a job @ her prior [employer] in [place].  Directed Pt to take TC from SW who referred her to me. Pt agreed.  Dr. Monna Fam

## 2021-10-02 NOTE — Patient Instructions (Signed)
Visit Information  Instructions: patient will work with SW to address needs related to [facility].  Patient was given the following information about care management and care coordination services today, agreed to services, and gave verbal consent: 1.care management/care coordination services include personalized support from designated clinical staff supervised by their physician, including individualized plan of care and coordination with other care providers 2. 24/7 contact phone numbers for assistance for urgent and routine care needs. 3. The patient may stop care management/care coordination services at any time by phone call to the office staff.  Patient verbalizes understanding of instructions provided today and agrees to view in MyChart.   The care management team will reach out to the patient again over the next 30 days.   Christen Butter, BSW  Social Worker [facility] Care Management  [phone]

## 2021-10-02 NOTE — Chronic Care Management (AMB) (Signed)
  Care Management   Social Work Visit Note  10/02/2021 Name: Shannon Newton MRN: [id number] DOB: 11-Dec-1966  [name] is a 55 y.o. year old female who sees Dellis Filbert, MD for primary care. The care management team was consulted for assistance with care management and care coordination needs related to [facility] Resources    Patient was given the following information about care management and care coordination services today, agreed to services, and gave verbal consent: 1.care management/care coordination services include personalized support from designated clinical staff supervised by their physician, including individualized plan of care and coordination with other care providers 2. 24/7 contact phone numbers for assistance for urgent and routine care needs. 3. The patient may stop care management/care coordination services at any time by phone call to the office staff.  Engaged with patient by telephone for follow up visit in response to provider referral for social work chronic care management and care coordination services.  Assessment: Review of patient history, allergies, and health status during evaluation of patient need for care management/care coordination services.    Interventions:  Patient interviewed and appropriate assessments performed Collaborated with clinical team regarding patient needs  Patient advised she is was told to leave her brothers home. Patient is now living out of her car.  Patient advised she is working an occasionally buys a hotel to shower. SW discussed cheaper options with showering.  Patient advised she enjoys the freedom of living out of her car. Patient stated she will save money and get a apartment when ready.  Patient tone was excited. Patient stated she was the happiest she has been in a while. SW discussed mood swings and advised if feelings of low appear to contact Dr. Monna Fam or SW. Patient agreed.  Patient discussed success with new job  and was optimistic about the future.  Patient denied needing food resources or any additional interventions.   SDOH (Social Determinants of Health) assessments performed: Yes     Plan:  Sw will follow up with patient within 30-days.   Christen Butter, BSW  Social Worker [facility] Care Management  [phone]

## 2021-10-03 ENCOUNTER — Other Ambulatory Visit (HOSPITAL_COMMUNITY): Payer: Self-pay

## 2021-10-10 ENCOUNTER — Other Ambulatory Visit: Payer: Self-pay | Admitting: Student

## 2021-10-10 ENCOUNTER — Other Ambulatory Visit (HOSPITAL_COMMUNITY): Payer: Self-pay

## 2021-10-10 MED ORDER — PRAVASTATIN SODIUM 40 MG PO TABS
40.0000 mg | ORAL_TABLET | Freq: Every evening | ORAL | 3 refills | Status: DC
  Filled 2021-10-10: qty 30, 30d supply, fill #0
  Filled 2022-04-07: qty 30, 30d supply, fill #1

## 2021-10-10 MED ORDER — AMLODIPINE BESYLATE 5 MG PO TABS
5.0000 mg | ORAL_TABLET | Freq: Every day | ORAL | 3 refills | Status: DC
  Filled 2021-10-10: qty 30, 30d supply, fill #0
  Filled 2022-04-07: qty 30, 30d supply, fill #1

## 2021-10-10 MED ORDER — LEVOTHYROXINE SODIUM 88 MCG PO TABS
88.0000 ug | ORAL_TABLET | Freq: Every day | ORAL | 3 refills | Status: DC
  Filled 2021-10-10: qty 30, 30d supply, fill #0
  Filled 2022-04-07: qty 30, 30d supply, fill #1

## 2021-10-10 MED ORDER — PANTOPRAZOLE SODIUM 40 MG PO TBEC
40.0000 mg | DELAYED_RELEASE_TABLET | Freq: Every day | ORAL | 3 refills | Status: DC
  Filled 2021-10-10: qty 30, 30d supply, fill #0
  Filled 2022-04-07: qty 30, 30d supply, fill #1

## 2021-10-13 ENCOUNTER — Other Ambulatory Visit (HOSPITAL_COMMUNITY): Payer: Self-pay

## 2021-10-22 ENCOUNTER — Ambulatory Visit: Payer: Self-pay | Admitting: Behavioral Health

## 2021-10-22 ENCOUNTER — Telehealth: Payer: Self-pay | Admitting: Behavioral Health

## 2021-10-22 NOTE — Telephone Encounter (Signed)
Lft multiple VM msgs for Pt today. 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 Lft msg detailing Pt can contact Clinician prn for psychotherapy needs @ her discretion.  Dr. Monna Fam

## 2021-11-27 ENCOUNTER — Telehealth: Payer: Self-pay | Admitting: Licensed Clinical Social Worker

## 2021-11-27 NOTE — Telephone Encounter (Signed)
°  Care Management   Follow Up Note   11/27/2021 Name: Shannon Newton MRN: [id number] DOB: Jan 05, 1966   Referred by: Dellis Filbert, MD Reason for referral : Care Coordination (BSW Follow up )   An unsuccessful telephone outreach was attempted today. The patient was referred to the case management team for assistance with care management and care coordination.   Follow Up Plan: The patient has been provided with contact information for the care management team and has been advised to call with any health related questions or concerns.   Christen Butter, BSW  Social Worker [facility] Care Management  [phone]

## 2022-04-07 ENCOUNTER — Other Ambulatory Visit (HOSPITAL_COMMUNITY): Payer: Self-pay

## 2022-05-20 ENCOUNTER — Other Ambulatory Visit: Payer: Self-pay

## 2022-05-20 ENCOUNTER — Ambulatory Visit (INDEPENDENT_AMBULATORY_CARE_PROVIDER_SITE_OTHER): Payer: PRIVATE HEALTH INSURANCE | Admitting: Student

## 2022-05-20 ENCOUNTER — Encounter: Payer: Self-pay | Admitting: Student

## 2022-05-20 ENCOUNTER — Other Ambulatory Visit (HOSPITAL_COMMUNITY): Payer: Self-pay

## 2022-05-20 VITALS — BP 145/83 | HR 78 | Temp 97.8°F | Ht 61.5 in | Wt 210.6 lb

## 2022-05-20 DIAGNOSIS — K219 Gastro-esophageal reflux disease without esophagitis: Secondary | ICD-10-CM

## 2022-05-20 DIAGNOSIS — E785 Hyperlipidemia, unspecified: Secondary | ICD-10-CM | POA: Diagnosis not present

## 2022-05-20 DIAGNOSIS — Z1231 Encounter for screening mammogram for malignant neoplasm of breast: Secondary | ICD-10-CM

## 2022-05-20 DIAGNOSIS — F3341 Major depressive disorder, recurrent, in partial remission: Secondary | ICD-10-CM

## 2022-05-20 DIAGNOSIS — E039 Hypothyroidism, unspecified: Secondary | ICD-10-CM | POA: Diagnosis not present

## 2022-05-20 DIAGNOSIS — Z1211 Encounter for screening for malignant neoplasm of colon: Secondary | ICD-10-CM

## 2022-05-20 DIAGNOSIS — M7581 Other shoulder lesions, right shoulder: Secondary | ICD-10-CM

## 2022-05-20 DIAGNOSIS — R7303 Prediabetes: Secondary | ICD-10-CM

## 2022-05-20 DIAGNOSIS — I10 Essential (primary) hypertension: Secondary | ICD-10-CM

## 2022-05-20 DIAGNOSIS — E559 Vitamin D deficiency, unspecified: Secondary | ICD-10-CM | POA: Diagnosis not present

## 2022-05-20 DIAGNOSIS — Z0000 Encounter for general adult medical examination without abnormal findings: Secondary | ICD-10-CM

## 2022-05-20 DIAGNOSIS — M778 Other enthesopathies, not elsewhere classified: Secondary | ICD-10-CM

## 2022-05-20 LAB — POCT GLYCOSYLATED HEMOGLOBIN (HGB A1C): Hemoglobin A1C: 5.8 % — AB (ref 4.0–5.6)

## 2022-05-20 LAB — GLUCOSE, CAPILLARY: Glucose-Capillary: 85 mg/dL (ref 70–99)

## 2022-05-20 MED ORDER — PRAVASTATIN SODIUM 40 MG PO TABS
40.0000 mg | ORAL_TABLET | Freq: Every evening | ORAL | 3 refills | Status: DC
  Filled 2022-05-20: qty 30, 30d supply, fill #0
  Filled 2022-09-04: qty 30, 30d supply, fill #1

## 2022-05-20 MED ORDER — ESCITALOPRAM OXALATE 10 MG PO TABS
10.0000 mg | ORAL_TABLET | Freq: Every day | ORAL | 1 refills | Status: DC
  Filled 2022-05-20: qty 30, 30d supply, fill #0
  Filled 2022-09-04: qty 30, 30d supply, fill #1
  Filled 2023-04-16 – 2023-05-05 (×2): qty 30, 30d supply, fill #2

## 2022-05-20 MED ORDER — ALBUTEROL SULFATE HFA 108 (90 BASE) MCG/ACT IN AERS
1.0000 | INHALATION_SPRAY | Freq: Four times a day (QID) | RESPIRATORY_TRACT | 1 refills | Status: DC | PRN
  Filled 2022-05-20: qty 6.7, 25d supply, fill #0
  Filled 2023-04-16 – 2023-05-05 (×2): qty 6.7, 25d supply, fill #1

## 2022-05-20 MED ORDER — AMLODIPINE BESYLATE 5 MG PO TABS
5.0000 mg | ORAL_TABLET | Freq: Every day | ORAL | 3 refills | Status: DC
Start: 2022-05-20 — End: 2022-05-23
  Filled 2022-05-20: qty 30, 30d supply, fill #0

## 2022-05-20 MED ORDER — ACETAMINOPHEN 500 MG PO TABS
1000.0000 mg | ORAL_TABLET | Freq: Three times a day (TID) | ORAL | 2 refills | Status: AC | PRN
  Filled 2022-05-20 – 2023-05-05 (×2): qty 100, 17d supply, fill #0

## 2022-05-20 MED ORDER — LEVOTHYROXINE SODIUM 88 MCG PO TABS
88.0000 ug | ORAL_TABLET | Freq: Every day | ORAL | 3 refills | Status: DC
  Filled 2022-05-20: qty 30, 30d supply, fill #0
  Filled 2022-09-04: qty 30, 30d supply, fill #1
  Filled 2022-11-16: qty 30, 30d supply, fill #2

## 2022-05-20 MED ORDER — PANTOPRAZOLE SODIUM 40 MG PO TBEC
40.0000 mg | DELAYED_RELEASE_TABLET | Freq: Every day | ORAL | 3 refills | Status: DC
  Filled 2022-05-20: qty 30, 30d supply, fill #0
  Filled 2022-09-04: qty 30, 30d supply, fill #1
  Filled 2023-04-16 – 2023-05-05 (×2): qty 30, 30d supply, fill #2

## 2022-05-20 MED ORDER — DICLOFENAC SODIUM 1 % EX GEL
2.0000 g | Freq: Four times a day (QID) | CUTANEOUS | 3 refills | Status: DC
  Filled 2022-05-20: qty 100, 12d supply, fill #0
  Filled 2022-05-21: qty 100, 13d supply, fill #0
  Filled 2022-09-04 (×2): qty 100, 13d supply, fill #1

## 2022-05-20 NOTE — Progress Notes (Unsigned)
   CC: Right shoulder pain HPI:  [name] is a 56 y.o. female with PMH as below who presents to clinic for evaluation for chronic right shoulder pain. Please see problem based charting for evaluation, assessment and plan.  Past Medical History:  Diagnosis Date   Abdominal pain, recurrent 2009   per CT of the abd/pelvis (01/26/2008) -  Diffuse inflammatory change of the descending and rectosigmoid colon.  Additional inflammatory changes within the terminal ileum raise concern for inflammatory bowel disease (Crohn's disease).   Back pain, chronic 2001   per MRI in 2001 - disc protrusion L5-6, to the right   Complication of anesthesia    hard to wake up-sob-possible sleep apnea   Depression    Hypertension    Hypothyroidism 2003   Thyroid US done in 2003 (results in [place]) - showed increased 24 hour uptake and the findings were consistent with Grave's disease; patient is s/p RAI therapy (09/19/2002)   Sickle cell trait (HCC)    Urinary frequency     Review of Systems:  Constitutional: Negative for fatigue or unintentional weight changes MSK: Positive for right shoulder pain. Negative for back pain, neck or trauma. GI: Negative for abdominal pain, nausea or vomiting Neuro: Negative for numbness, headache or weakness  Physical Exam: General: Pleasant, well-appearing middle-aged woman. No acute distress. Cardiac: RRR. No murmurs, rubs or gallops. No LE edema Respiratory: Lungs CTAB. No wheezing or crackles. Skin: Warm, dry and intact without rashes or lesions MSK: Mild tenderness to palpation of the deltoid muscle and subacromial tendon. No edema or crepitus. Negative empty can test and Hawkins-Kennedy test. Normal ROM of the R shoulder.  Extremities: Atraumatic. Full ROM. Palpable radial and DP pulses. Neuro: A&O x 3. Moves all extremities. Normal sensation to gross touch. Psych: Appropriate mood and affect.  Vitals:   05/20/22 1456 05/20/22 1512  BP: (!) 150/63 (!)  145/83  Pulse: 79 78  Temp: 97.8 F (36.6 C)   TempSrc: Oral   SpO2: 100%   Weight: 210 lb 9.6 oz (95.5 kg)   Height: 5' 1.5" (1.562 m)     Assessment & Plan:   See Encounters Tab for problem based charting.  Patient discussed with Dr.  Curt Bears, MD, MPH

## 2022-05-20 NOTE — Patient Instructions (Signed)
Thank you, [name] for allowing [place] to provide your care today. Today, we discussed your right shoulder pain, hypothyroidism, blood pressure, cholesterol and depression.  For your shoulder pain, I am referring you to occupational therapy at your orthopedic surgeon for assessment of your shoulder.  I have sent refills of all your medications to [facility] pharmacy.  I have also ordered some preventative screening test such as colonoscopy and mammography.  I have ordered the following labs for you:   Lab Orders         Vitamin D (25 hydroxy)         TSH         Lipid Profile         POC Hbg A1C      I will call if any are abnormal. All of your labs can be accessed through "My Chart".  I have place a referrals to GI for colonoscopy, breast center for mammography, orthopedic surgery for reevaluation of your shoulder, occupational therapy for therapy   I have ordered the following medication/changed the following medications:  Start Tylenol 1000 mg every 8 hours as needed for pain Apply Voltaren gel to right shoulder up to 4 times daily for pain  My Chart Access: [email or web address]  Please follow-up in 6-12 months  Please make sure to arrive 15 minutes prior to your next appointment. If you arrive late, you may be asked to reschedule.    We look forward to seeing you next time. Please call our clinic at [phone] if you have any questions or concerns. The best time to call is Monday-Friday from 9am-4pm, but there is someone available 24/7. If after hours or the weekend, call the main hospital number and ask for the Internal Medicine Resident On-Call. If you need medication refills, please notify your pharmacy one week in advance and they will send [place] a request.   Thank you for letting us take part in your care. Wishing you the best!  Steffanie Rainwater, MD 05/20/2022, 4:25 PM IM Resident, PGY-2 Duwayne Heck 41:10

## 2022-05-21 ENCOUNTER — Other Ambulatory Visit (HOSPITAL_COMMUNITY): Payer: Self-pay

## 2022-05-21 LAB — LIPID PANEL
Chol/HDL Ratio: 2.9 ratio (ref 0.0–4.4)
Cholesterol, Total: 239 mg/dL — ABNORMAL HIGH (ref 100–199)
HDL: 82 mg/dL (ref >39)
LDL Chol Calc (NIH): 144 mg/dL — ABNORMAL HIGH (ref 0–99)
Triglycerides: 78 mg/dL (ref 0–149)
VLDL Cholesterol Cal: 13 mg/dL (ref 5–40)

## 2022-05-21 LAB — VITAMIN D 25 HYDROXY (VIT D DEFICIENCY, FRACTURES): Vit D, 25-Hydroxy: 7.2 ng/mL — ABNORMAL LOW (ref 30.0–100.0)

## 2022-05-21 LAB — TSH: TSH: 2.12 u[IU]/mL (ref 0.450–4.500)

## 2022-05-22 ENCOUNTER — Other Ambulatory Visit (HOSPITAL_COMMUNITY): Payer: Self-pay

## 2022-05-23 ENCOUNTER — Encounter: Payer: Self-pay | Admitting: Student

## 2022-05-23 DIAGNOSIS — R7303 Prediabetes: Secondary | ICD-10-CM | POA: Insufficient documentation

## 2022-05-23 MED ORDER — CHOLECALCIFEROL 1.25 MG (50000 UT) PO CAPS
50000.0000 [IU] | ORAL_CAPSULE | ORAL | 0 refills | Status: AC
  Filled 2022-05-23: qty 4, 28d supply, fill #0
  Filled 2022-09-04: qty 4, 28d supply, fill #1

## 2022-05-23 MED ORDER — AMLODIPINE BESYLATE 10 MG PO TABS
10.0000 mg | ORAL_TABLET | Freq: Every day | ORAL | 1 refills | Status: DC
  Filled 2022-05-23: qty 30, 30d supply, fill #0
  Filled 2022-09-04: qty 30, 30d supply, fill #1

## 2022-05-23 NOTE — Assessment & Plan Note (Signed)
BP still not well controlled.  SBP initially in the 150s with slight improvement to the 140s on repeat.  Right shoulder pain likely contributing to elevation in blood pressure patient will still require adjustment of her blood pressure regiment for better BP control. Vitals:   05/20/22 1456 05/20/22 1512  BP: (!) 150/63 (!) 145/83   Plan: -Increase amlodipine from 5 to 10 mg daily -BP and BMP at next office visit

## 2022-05-23 NOTE — Assessment & Plan Note (Signed)
A1c well controlled from 5.9% to 5.8% today.  We will continue to monitor closely. -A1c every 6 months

## 2022-05-23 NOTE — Assessment & Plan Note (Addendum)
Patient has a history of Graves' disease and was treated with radioactive iodine 20 years ago. She has developed hypothyroidism but is well-controlled on current dose of Synthroid. TSH today is 2.12. Denies any fatigue or GI symptoms. -Continue Synthroid 88 mcg daily before breakfast

## 2022-05-23 NOTE — Assessment & Plan Note (Signed)
Patient with a history of vitamin D deficiency not on any supplements found to have vitamin D level very low at 7.2.  Patient previously on vitamin D supplementation.  States she ran out of prescription and was not informed that she needed to continue.  Plan to start patient on high-dose vitamin D supplementation monitor vitamin D levels.  We will check  PTH levels at next office visit   Plan: -Cholecalciferol 50,000 units every 7 days for 8 doses -Repeat vitamin D levels in 2 months -Check PTH at next office visit

## 2022-05-23 NOTE — Assessment & Plan Note (Signed)
Patient referred to GI today for colonoscopy

## 2022-05-23 NOTE — Assessment & Plan Note (Signed)
A1c remains well controlled at 5.8%.  Patient has lost 14 pounds since last year.  States she has decreased the amount of carbs/sugary foods in her diet and will work on decreasing fatty foods as well.  Current weight is 210 lbs.  -Continue lifestyle modifications with exercise and dietary changes

## 2022-05-23 NOTE — Assessment & Plan Note (Signed)
Referred for screening mammogram 

## 2022-05-23 NOTE — Assessment & Plan Note (Signed)
Patient with a history of right shoulder surgery presents today for evaluation of right shoulder pain. Patient received right shoulder arthroscopy with subacromial decompression and debridement in 2016 with orthopedic surgery.  She received physical therapy after surgery with improvement in her symptoms. Last year, patient started having some discomfort in her right shoulder. She has been managing with OTC Tylenol however, the pain has gotten worse the past few weeks. 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 She denies any new trauma to her right shoulder. She denies any weakness, numbness or tingling of the right shoulder and also denies any neck pain or back pain.  Patient still able to perform her daily activities. Patient's right shoulder pain likely right shoulder tendinopathy secondary to overuse versus right shoulder arthritis. Plan to continue conservative management, refer to occupational therapy and refer back to orthopedic surgeon for further evaluation  Plan: -Start Tylenol 1000 mg every 8 hours as needed for pain -Patient allergic to oral NSAIDs, trial lidocaine gel on right shoulder as needed for pain -Referral to Occupational Therapy -ReferrAL orthopedic surgery, Dr. Erlinda Hong, who performed patient's surgery 7 years ago

## 2022-05-23 NOTE — Assessment & Plan Note (Addendum)
Improving.  PHQ-9 of 13 today improved from 21 last year. Patient states she is in a better place now compared to last year when she was dealing with stress and the losses in her family. -Continue Lexapro 10 mg daily

## 2022-05-23 NOTE — Assessment & Plan Note (Signed)
Lipid panel today showed LDL of 144.  Patient states she has been on pravastatin for few years due to inability to tolerate atorvastatin.  She has lost 14 pounds since last year however she states last few weeks her diet has not been the best due to her eating out a lot more this last few weeks. Patient states she is motivated and plans to cut down fatty and processed foods.  LDL goal <100.   Plan: -Continue pravastatin 40 mg daily -Repeat lipid panel in 6 months, consider switching to Crestor if LDL not at goal. -Encouraged to continue lifestyle modifications including decreasing intake of fatty foods and exercising for at least 30 minutes 3-5 times per week.

## 2022-05-25 ENCOUNTER — Other Ambulatory Visit (HOSPITAL_COMMUNITY): Payer: Self-pay

## 2022-05-27 NOTE — Progress Notes (Signed)
Internal Medicine Clinic Attending  Case discussed with Dr. Amponsah  At the time of the visit.  We reviewed the resident's history and exam and pertinent patient test results.  I agree with the assessment, diagnosis, and plan of care documented in the resident's note.  

## 2022-06-05 ENCOUNTER — Encounter: Payer: Self-pay | Admitting: Orthopaedic Surgery

## 2022-06-05 ENCOUNTER — Ambulatory Visit (INDEPENDENT_AMBULATORY_CARE_PROVIDER_SITE_OTHER): Payer: PRIVATE HEALTH INSURANCE

## 2022-06-05 ENCOUNTER — Ambulatory Visit (INDEPENDENT_AMBULATORY_CARE_PROVIDER_SITE_OTHER): Payer: PRIVATE HEALTH INSURANCE | Admitting: Orthopaedic Surgery

## 2022-06-05 ENCOUNTER — Other Ambulatory Visit (HOSPITAL_COMMUNITY): Payer: Self-pay

## 2022-06-05 DIAGNOSIS — M542 Cervicalgia: Secondary | ICD-10-CM

## 2022-06-05 DIAGNOSIS — G8929 Other chronic pain: Secondary | ICD-10-CM

## 2022-06-05 DIAGNOSIS — M25511 Pain in right shoulder: Secondary | ICD-10-CM

## 2022-06-05 MED ORDER — PREDNISONE 10 MG PO TABS
ORAL_TABLET | ORAL | 3 refills | Status: DC
  Filled 2022-06-05: qty 21, 6d supply, fill #0

## 2022-06-05 MED ORDER — DICLOFENAC SODIUM 75 MG PO TBEC
75.0000 mg | DELAYED_RELEASE_TABLET | Freq: Two times a day (BID) | ORAL | 2 refills | Status: DC
  Filled 2022-06-05: qty 30, 15d supply, fill #0

## 2022-06-17 ENCOUNTER — Other Ambulatory Visit (HOSPITAL_COMMUNITY): Payer: Self-pay

## 2022-06-23 ENCOUNTER — Ambulatory Visit (HOSPITAL_COMMUNITY): Payer: PRIVATE HEALTH INSURANCE | Attending: Internal Medicine

## 2022-09-04 ENCOUNTER — Other Ambulatory Visit (HOSPITAL_COMMUNITY): Payer: Self-pay

## 2022-11-16 ENCOUNTER — Other Ambulatory Visit (HOSPITAL_COMMUNITY): Payer: Self-pay

## 2022-11-26 ENCOUNTER — Ambulatory Visit: Payer: Self-pay | Admitting: Licensed Clinical Social Worker

## 2022-11-26 ENCOUNTER — Other Ambulatory Visit (HOSPITAL_COMMUNITY): Payer: Self-pay

## 2022-11-26 NOTE — Patient Outreach (Signed)
SW removed self from care team.   Korban Shearer, BSW, MSW, LCSW-A  Social Worker [facility] Care Management  [phone] 

## 2023-02-10 ENCOUNTER — Other Ambulatory Visit (HOSPITAL_COMMUNITY): Payer: Self-pay

## 2023-02-10 ENCOUNTER — Encounter: Payer: Self-pay | Admitting: Student

## 2023-02-10 ENCOUNTER — Ambulatory Visit (INDEPENDENT_AMBULATORY_CARE_PROVIDER_SITE_OTHER): Payer: Self-pay | Admitting: Student

## 2023-02-10 ENCOUNTER — Ambulatory Visit (HOSPITAL_COMMUNITY)
Admission: RE | Admit: 2023-02-10 | Discharge: 2023-02-10 | Disposition: A | Discharge status: Home / Self Care | Payer: Self-pay | Source: Ambulatory Visit | Attending: Student in an Organized Health Care Education/Training Program | Admitting: Student in an Organized Health Care Education/Training Program

## 2023-02-10 VITALS — BP 172/111 | HR 69 | Temp 98.2°F | Ht 61.5 in | Wt 213.1 lb

## 2023-02-10 DIAGNOSIS — E039 Hypothyroidism, unspecified: Secondary | ICD-10-CM

## 2023-02-10 DIAGNOSIS — I10 Essential (primary) hypertension: Secondary | ICD-10-CM

## 2023-02-10 DIAGNOSIS — M1990 Unspecified osteoarthritis, unspecified site: Secondary | ICD-10-CM

## 2023-02-10 DIAGNOSIS — E785 Hyperlipidemia, unspecified: Secondary | ICD-10-CM

## 2023-02-10 DIAGNOSIS — Z0000 Encounter for general adult medical examination without abnormal findings: Secondary | ICD-10-CM

## 2023-02-10 DIAGNOSIS — M778 Other enthesopathies, not elsewhere classified: Secondary | ICD-10-CM

## 2023-02-10 MED ORDER — AMLODIPINE BESYLATE 10 MG PO TABS
10.0000 mg | ORAL_TABLET | Freq: Every day | ORAL | 1 refills | Status: DC
  Filled 2023-02-10: qty 30, 30d supply, fill #0
  Filled 2023-04-16: qty 90, 90d supply, fill #0
  Filled 2023-05-05: qty 30, 30d supply, fill #0

## 2023-02-10 MED ORDER — DICLOFENAC SODIUM 1 % EX GEL
2.0000 g | Freq: Four times a day (QID) | CUTANEOUS | 3 refills | Status: DC
  Filled 2023-02-10 – 2023-05-05 (×3): qty 100, 13d supply, fill #0

## 2023-02-10 MED ORDER — LEVOTHYROXINE SODIUM 88 MCG PO TABS
88.0000 ug | ORAL_TABLET | Freq: Every day | ORAL | 3 refills | Status: DC
  Filled 2023-02-10: qty 30, 30d supply, fill #0
  Filled 2023-04-16: qty 90, 90d supply, fill #0
  Filled 2023-05-05: qty 30, 30d supply, fill #0

## 2023-02-10 MED ORDER — PRAVASTATIN SODIUM 40 MG PO TABS
40.0000 mg | ORAL_TABLET | Freq: Every evening | ORAL | 3 refills | Status: DC
  Filled 2023-02-10: qty 30, 30d supply, fill #0
  Filled 2023-04-16: qty 90, 90d supply, fill #0
  Filled 2023-05-05: qty 30, 30d supply, fill #0

## 2023-02-10 NOTE — Patient Instructions (Addendum)
Thank you so much for coming to the clinic today!   I think your pain is due to your overworking your hand at work, and could be arthritis. I recommend picking up some splints for your thumbs and wear them at night. We are also going to go ahead and get x-rays. I have also refilled most of your medications. Please check your blood pressure at home.   If you have any questions please feel free to the call the clinic at anytime at [phone]. It was a pleasure seeing you!  Best, Dr. Sanjuana Mae

## 2023-02-11 DIAGNOSIS — M1990 Unspecified osteoarthritis, unspecified site: Secondary | ICD-10-CM | POA: Insufficient documentation
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
## 2023-02-11 NOTE — Assessment & Plan Note (Addendum)
Patient presents with an intermittent history of bilateral hand pain.  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  The pain is most prominent on the metacarpal joint of both thumbs, and is tender to palpation.  She says occasionally to come pain with some numbness and tingling.  She has not tried Tylenol for the pain, and she has an allergy to multiple different NSAIDs.  On physical exam there is tenderness to palpation over the MCP joints of the thumbs bilaterally, no joint effusions could be palpated, and no erythema noted either.  I believe her symptoms may be secondary to osteoarthritis, as she has clear evidence of overuse injury. However, due to patient's allergies to first line treatment such as NSAIDs, recommended ice and joint rest and protection with splints. Will also evaluate with X-Rays of both hands to characterize arthritis.   Plan:  - X-Ray of both hands  - Wrist/Thumb Splinting   Addendum:  X-Rays have confirmed osteoarthritic changes in both hands bilaterally. Given patient's allergies to NSAIDs, they are not an option. Recommend continued treatment with ice and joint rest/protection with splints. Can consider injection at next visit if pain is not adequately controlled.

## 2023-02-11 NOTE — Progress Notes (Signed)
Internal Medicine Clinic Attending  Case discussed with Dr. Sanjuana Mae  At the time of the visit.  We reviewed the resident's history and exam and pertinent patient test results.  I agree with the assessment, diagnosis, and plan of care documented in the resident's note.

## 2023-02-11 NOTE — Assessment & Plan Note (Signed)
Pt takes amlodipine '10mg'$ , however has been out of her medication for two weeks at this point. Initial blood pressure was 170/102, and repeat was 172/111. She currently denies any chest pain, shortness of breath, or blurry vision. Refilled patient's medications, and will re-evaluate at next visit. Also recommended patient use a blood pressure cuff at home and monitor blood pressure daily.

## 2023-02-11 NOTE — Assessment & Plan Note (Signed)
Patient had referral for colonoscopy done at last visit, however did not follow up. Encouraged patient to call center and schedule.

## 2023-02-18 ENCOUNTER — Other Ambulatory Visit: Payer: Self-pay

## 2023-02-18 NOTE — Progress Notes (Signed)
Patient outreached by Lazarus Gowda, PharmD Candidate on 02/17/2023 to discuss hypertension    Patient does not have an automated home blood pressure machine. She was told by her PCP last week to get one, but has not yet. Gave her options for where to get them and estimate cost.    Medication review was performed. They are taking medications as prescribed.   The following barriers to adherence were noted:  - They do not have cost concerns.  - They do not have transportation concerns.  - They do not need assistance obtaining refills.  - They do not occasionally forget to take some of their prescribed medications.  - They do not feel like one/some of their medications make them feel poorly.  - They do have questions or concerns about their medications.  - They do not have follow up scheduled with their primary care provider/cardiologist.   The following interventions were completed:  - Medications were reviewed  - Patient was educated on proper technique to check home blood pressure and reminded to bring home machine and readings to next provider appointment  - Patient was educated on medications, including indication and administration  - Patient was educated on how to access home blood pressure machine  - The patient asked if it was ok to take pravastatin in the morning instead of the evening to help with adherence. Pharmacy student, Donney Rankins, reached back out to the patient to let her know she will need to take this in the evening.   The patient does not have follow up scheduled    Lazarus Gowda, PharmD Candidate. 02/17/2023   Shannon Newton, PharmD PGY-1 [facility] Pharm[facility] Resident

## 2023-02-23 ENCOUNTER — Other Ambulatory Visit (HOSPITAL_COMMUNITY): Payer: Self-pay

## 2023-04-16 ENCOUNTER — Other Ambulatory Visit (HOSPITAL_COMMUNITY): Payer: Self-pay

## 2023-04-29 ENCOUNTER — Other Ambulatory Visit (HOSPITAL_COMMUNITY): Payer: Self-pay

## 2023-05-05 ENCOUNTER — Other Ambulatory Visit (HOSPITAL_COMMUNITY): Payer: Self-pay

## 2023-07-15 ENCOUNTER — Encounter: Payer: Self-pay | Admitting: Student

## 2023-07-15 ENCOUNTER — Other Ambulatory Visit (HOSPITAL_COMMUNITY): Payer: Self-pay

## 2023-07-15 ENCOUNTER — Ambulatory Visit (INDEPENDENT_AMBULATORY_CARE_PROVIDER_SITE_OTHER): Payer: Self-pay | Admitting: Student

## 2023-07-15 VITALS — BP 127/94 | HR 84 | Temp 97.8°F | Ht 61.5 in | Wt 211.2 lb

## 2023-07-15 DIAGNOSIS — Z0000 Encounter for general adult medical examination without abnormal findings: Secondary | ICD-10-CM

## 2023-07-15 DIAGNOSIS — J4521 Mild intermittent asthma with (acute) exacerbation: Secondary | ICD-10-CM | POA: Insufficient documentation

## 2023-07-15 DIAGNOSIS — K219 Gastro-esophageal reflux disease without esophagitis: Secondary | ICD-10-CM

## 2023-07-15 DIAGNOSIS — E039 Hypothyroidism, unspecified: Secondary | ICD-10-CM

## 2023-07-15 DIAGNOSIS — I10 Essential (primary) hypertension: Secondary | ICD-10-CM

## 2023-07-15 MED ORDER — BUDESONIDE-FORMOTEROL FUMARATE 80-4.5 MCG/ACT IN AERO
2.0000 | INHALATION_SPRAY | Freq: Four times a day (QID) | RESPIRATORY_TRACT | 12 refills | Status: DC | PRN
  Filled 2023-07-15: qty 10.2, 30d supply, fill #0
  Filled 2023-12-24: qty 10.2, 30d supply, fill #1

## 2023-07-15 MED ORDER — AMLODIPINE BESYLATE 10 MG PO TABS
10.0000 mg | ORAL_TABLET | Freq: Every day | ORAL | 1 refills | Status: DC
  Filled 2023-07-15: qty 30, 30d supply, fill #0
  Filled 2023-07-15: qty 90, 90d supply, fill #0

## 2023-07-15 MED ORDER — PANTOPRAZOLE SODIUM 40 MG PO TBEC
40.0000 mg | DELAYED_RELEASE_TABLET | Freq: Every day | ORAL | 3 refills | Status: DC
Start: 2023-07-15 — End: 2024-09-20
  Filled 2023-07-15: qty 30, 30d supply, fill #0
  Filled 2023-12-24: qty 30, 30d supply, fill #1

## 2023-07-15 MED ORDER — LEVOTHYROXINE SODIUM 88 MCG PO TABS
88.0000 ug | ORAL_TABLET | Freq: Every day | ORAL | 3 refills | Status: DC
Start: 2023-07-15 — End: 2024-09-20
  Filled 2023-07-15: qty 30, 30d supply, fill #0
  Filled 2023-09-24 (×2): qty 30, 30d supply, fill #1
  Filled 2023-12-24: qty 30, 30d supply, fill #2

## 2023-07-15 NOTE — Patient Instructions (Signed)
Thank you, Shannon Newton for allowing [place] to provide your care today. Today we discussed your recent coughing, sneezing, throat complaint related to your acute reactive airway disease that is likely triggered by the dust/allergens in your current work environment.   I have ordered the following labs for you:  Lab Orders  No laboratory test(s) ordered today     Tests ordered today: - None  Referrals ordered today:   Referral Orders  No referral(s) requested today     I have ordered the following medication/changed the following medications:   Stop the following medications: There are no discontinued medications.   Start the following medications: No orders of the defined types were placed in this encounter.    Follow up:  - Please make a follow-up visit in 7-10 days   Remember:   - You will start a NEW inhaler called Symbicort. You can use it 1-2 puffs as needed every 6 hours.   - You will get refill for the following medications: pantoprazole, levothyroxine, and amlodipine.   - Please give [place] a call back if you have a new fever, cough worsens, or you are acutely out of breath and are not responding to your inhaler. If you have a medical emergency, please call 911.   - We provided a letter to your work asking for accommodations. There is a copy of this letter in your file should you need it to be sent directly to your employer.   Should you have any questions or concerns please call the internal medicine clinic at [phone].     Jigar Zielke Colbert Coyer, MD PGY-1 Internal Medicine Teaching [facility]

## 2023-07-15 NOTE — Assessment & Plan Note (Signed)
Patient with no other complaints today. Will review any needs related to chronic health conditions at follow-up visit. Today, will plan to refill medications as requested by the patient.   Plan:  - Provide refills for pantoprazole, levothyroxine, and amlodipine

## 2023-07-15 NOTE — Progress Notes (Signed)
Established Patient Office Visit  Subjective   Patient ID: Shannon Newton, female    DOB: 1966/03/16  Age: 57 y.o. MRN: [id number]  Chief Complaint  Patient presents with   Cough    cough,fever,sore throat Patient stated she has been working in a dusty room for about 3 weeks symptoms started about 4 days ago    Patient is a 57 y.o. with a past medical history stated below who presents today for cough, sneezing, and throat symptoms. Would also like refills for some of her medications. Please see problem based assessment and plan for additional details.   Cough Associated symptoms include headaches, shortness of breath and wheezing. Pertinent negatives include no chest pain, chills, ear pain, fever or rash.    Past Medical History:  Diagnosis Date   Abdominal pain, recurrent 2009   per CT of the abd/pelvis (01/26/2008) -  Diffuse inflammatory change of the descending and rectosigmoid colon.  Additional inflammatory changes within the terminal ileum raise concern for inflammatory bowel disease (Crohn's disease).   Back pain, chronic 2001   per MRI in 2001 - disc protrusion L5-6, to the right   Complication of anesthesia    hard to wake up-sob-possible sleep apnea   Depression    Hypertension    Hypothyroidism 2003   Thyroid US done in 2003 (results in [place]) - showed increased 24 hour uptake and the findings were consistent with Grave's disease; patient is s/p RAI therapy (09/19/2002)   Sickle cell trait (HCC)    Urinary frequency       Review of Systems  Constitutional:  Negative for chills and fever.  HENT:  Negative for congestion, ear discharge and ear pain.   Respiratory:  Positive for cough, shortness of breath and wheezing. Negative for sputum production.   Cardiovascular:  Negative for chest pain.  Skin:  Negative for rash.  Neurological:  Positive for headaches.      Objective:     BP (!) 127/94 (BP Location: Left Arm, Patient Position: Sitting, Cuff Size:  Normal)   Pulse 84   Temp 97.8 F (36.6 C) (Oral)   Ht 5' 1.5" (1.562 m)   Wt 211 lb 3.2 oz (95.8 kg)   SpO2 100%   BMI 39.26 kg/m  BP Readings from Last 3 Encounters:  07/15/23 (!) 127/94  02/10/23 (!) 172/111  05/20/22 (!) 145/83   Wt Readings from Last 3 Encounters:  07/15/23 211 lb 3.2 oz (95.8 kg)  02/10/23 213 lb 1.6 oz (96.7 kg)  05/20/22 210 lb 9.6 oz (95.5 kg)    Physical Exam HENT:     Head: Normocephalic and atraumatic.     Right Ear: Ear canal and external ear normal.     Left Ear: Ear canal and external ear normal.     Nose: Nose normal.     Mouth/Throat:     Mouth: Mucous membranes are moist.     Pharynx: No oropharyngeal exudate or posterior oropharyngeal erythema.  Eyes:     Extraocular Movements: Extraocular movements intact.     Pupils: Pupils are equal, round, and reactive to light.  Cardiovascular:     Rate and Rhythm: Normal rate and regular rhythm.     Pulses: Normal pulses.  Pulmonary:     Effort: Pulmonary effort is normal.     Breath sounds: Wheezing present.  Musculoskeletal:     Cervical back: Normal range of motion and neck supple.  Lymphadenopathy:     Cervical: No cervical adenopathy.  Skin:    General: Skin is warm and dry.  Neurological:     General: No focal deficit present.     Mental Status: She is alert and oriented to person, place, and time.  Psychiatric:        Mood and Affect: Mood normal.        Behavior: Behavior normal.    No results found for any visits on 07/15/23.   The 10-year ASCVD risk score (Arnett DK, et al., 2019) is: 5.7%    Assessment & Plan:   Problem List Items Addressed This Visit       Cardiovascular and Mediastinum   Hypertension   Relevant Medications   amLODipine (NORVASC) 10 MG tablet     Respiratory   RAD (reactive airway disease) with wheezing, mild intermittent, with acute exacerbation - Primary    Patient with history of asthma and albuterol inhaler use as needed. Her triggers  include dust. Patient with recent intermittent cough, sneezing, feeling like something is in her throat, and a temperature of 100 F which she treated with Tylenol which she states started about 3 weeks ago when she was moved to a different room at work to help clean up/arrange. She describes the room as very dusty and possibly being exposed to some mold. She has been wearing face masks to help with symptoms. Feels some relief leaving the room to go outside for fresh air. Recently, has been using albuterol inhaler too frequently at work, to the point where she feels lightheaded.   ROS negative for fever, chills, nasal drainage. Patient HDS, afebrile. Patient with slight upper lobe expiratory wheezing on exam, otherwise normal respiratory effort and breath sounds. Clinical picture not concerning for acute illness, such as strep throat or viral cold/COVID/flu. Patient counseled on use of combination inhaler like Symbicort. Patient agreeable to trying it instead of albuterol inhaler. Requested letter for work as others have wondered if she is wearing a mask because she may be contagious. Clarified in letter that symptoms are related to her triggers (dust/allergens). Patient will pick up medication today and will have close follow up.   Plan:  - Employer letter for accommodations (wearing a mask, frequent breaks to go outside/use inhaler/etc.) - Provided patient with several face masks - START Symbicort, STOP Albuterol  - 7-10 day follow-up       Relevant Medications   budesonide-formoterol (SYMBICORT) 80-4.5 MCG/ACT inhaler     Endocrine   Hypothyroidism   Relevant Medications   levothyroxine (SYNTHROID) 88 MCG tablet     Other   Healthcare maintenance    Patient with no other complaints today. Will review any needs related to chronic health conditions at follow-up visit. Today, will plan to refill medications as requested by the patient.   Plan:  - Provide refills for pantoprazole,  levothyroxine, and amlodipine      Other Visit Diagnoses     Gastroesophageal reflux disease, unspecified whether esophagitis present       Relevant Medications   pantoprazole (PROTONIX) 40 MG tablet       No follow-ups on file.   Patient to return 7-10 days.   Patient seen with Dr. Criselda Peaches.   Jalissa Heinzelman Colbert Coyer, MD PGY-1 Internal Medicine Teaching Program

## 2023-07-15 NOTE — Assessment & Plan Note (Signed)
Patient with history of asthma and albuterol inhaler use as needed. Her triggers include dust. Patient with recent intermittent cough, sneezing, feeling like something is in her throat, and a temperature of 100 F which she treated with Tylenol which she states started about 3 weeks ago when she was moved to a different room at work to help clean up/arrange. She describes the room as very dusty and possibly being exposed to some mold. She has been wearing face masks to help with symptoms. Feels some relief leaving the room to go outside for fresh air. Recently, has been using albuterol inhaler too frequently at work, to the point where she feels lightheaded.   ROS negative for fever, chills, nasal drainage. Patient HDS, afebrile. Patient with slight upper lobe expiratory wheezing on exam, otherwise normal respiratory effort and breath sounds. Clinical picture not concerning for acute illness, such as strep throat or viral cold/COVID/flu. Patient counseled on use of combination inhaler like Symbicort. Patient agreeable to trying it instead of albuterol inhaler. Requested letter for work as others have wondered if she is wearing a mask because she may be contagious. Clarified in letter that symptoms are related to her triggers (dust/allergens). Patient will pick up medication today and will have close follow up.   Plan:  - Employer letter for accommodations (wearing a mask, frequent breaks to go outside/use inhaler/etc.) - Provided patient with several face masks - START Symbicort, STOP Albuterol  - 7-10 day follow-up

## 2023-07-19 NOTE — Progress Notes (Signed)
Internal Medicine Clinic Attending  I was physically present during the key portions of the resident provided service and participated in the medical decision making of patient's management care. I reviewed pertinent patient test results.  The assessment, diagnosis, and plan were formulated together and I agree with the documentation in the resident's note. I agree with this plan.  Patient had increased use of albuterol after exposure.  She has RAD and this will be better controlled with ICS/laba combo.  Also, will send recommendations for accommodations to employer.   Inez Catalina, MD

## 2023-07-29 ENCOUNTER — Ambulatory Visit (INDEPENDENT_AMBULATORY_CARE_PROVIDER_SITE_OTHER): Payer: Self-pay | Admitting: Internal Medicine

## 2023-07-29 VITALS — BP 125/69 | HR 81 | Temp 98.5°F | Ht 61.5 in | Wt 213.5 lb

## 2023-07-29 DIAGNOSIS — Z124 Encounter for screening for malignant neoplasm of cervix: Secondary | ICD-10-CM

## 2023-07-29 DIAGNOSIS — Z1231 Encounter for screening mammogram for malignant neoplasm of breast: Secondary | ICD-10-CM

## 2023-07-29 DIAGNOSIS — J4521 Mild intermittent asthma with (acute) exacerbation: Secondary | ICD-10-CM

## 2023-07-29 NOTE — Progress Notes (Signed)
   CC: breathing f/u  HPI:  Shannon Newton is a 57 y.o. female with past medical history as detailed below who presents for follow up of breathing difficulties. Please see problem based charting for detailed assessment and plan.  Past Medical History:  Diagnosis Date   Abdominal pain, recurrent 2009   per CT of the abd/pelvis (01/26/2008) -  Diffuse inflammatory change of the descending and rectosigmoid colon.  Additional inflammatory changes within the terminal ileum raise concern for inflammatory bowel disease (Crohn's disease).   Back pain, chronic 2001   per MRI in 2001 - disc protrusion L5-6, to the right   Complication of anesthesia    hard to wake up-sob-possible sleep apnea   Depression    Hypertension    Hypothyroidism 2003   Thyroid US done in 2003 (results in [place]) - showed increased 24 hour uptake and the findings were consistent with Grave's disease; patient is s/p RAI therapy (09/19/2002)   Sickle cell trait (HCC)    Urinary frequency    Review of Systems:  Negative unless otherwise stated.  Physical Exam:  Vitals:   07/29/23 1505  BP: 125/69  Pulse: 81  Temp: 98.5 F (36.9 C)  TempSrc: Oral  SpO2: 99%  Weight: 213 lb 8 oz (96.8 kg)  Height: 5' 1.5" (1.562 m)   Constitutional:Well appearing female. In no acute distress. Cardio:Regular rate and rhythm. No murmurs, rubs, or gallops. Pulm:Clear to auscultation bilaterally. Normal work of breathing on room air. ZOX:WRUEAVWU for extremity edema. Skin:Warm and dry. Neuro:Alert and oriented x3. No focal deficit noted. Psych:Pleasant mood and affect.  Assessment & Plan:   See Encounters Tab for problem based charting.  RAD (reactive airway disease) with wheezing, mild intermittent, with acute exacerbation Patient recently evaluated due to increasing respiratory symptoms requiring increased use of albuterol rescue inhaler. She previously worked a job where she was exposed to several triggers such as dust.  She was switched to symbicort at that time and advised to wear a mask at work.  Today she states that she is no longer working at that job. In combination with symbicort, she notes drastic improvement of her respiratory symptoms of cough, wheezing, throat irritation. Her voice has changed as well with improvement of throat irritation. She is now using symbicort PRN but has not needed it recently. No infectious symptoms today. Plan:Continue symbicort PRN. Advised patient that if she begins to need this daily, we will reassess her and send in an additional inhaler so that she has both a maintenance and rescue inhaler available.  Encounter for screening mammogram for malignant neoplasm of breast Plan:Mammogram ordered today, will need assistance from [facility] given uninsured status.  Cervical cancer screening Plan:Referral to OB/GYN for pap smear, will need assistance from [facility] given uninsured status.  Patient discussed with Dr.  Lafonda Mosses

## 2023-07-29 NOTE — Patient Instructions (Signed)
Shannon Newton,  It was nice seeing you today! Thank you for choosing [facility] for your Primary Care.    I'm glad that you have had improvement of your breathing symptoms since changing to symbicort. We will continue that medication.  I will place a referral for you to have your mammogram and pap smear done as well.  Please follow up in about 6 months.  My best, Dr. August Saucer

## 2023-07-30 DIAGNOSIS — Z124 Encounter for screening for malignant neoplasm of cervix: Secondary | ICD-10-CM | POA: Insufficient documentation

## 2023-07-30 DIAGNOSIS — Z1231 Encounter for screening mammogram for malignant neoplasm of breast: Secondary | ICD-10-CM | POA: Insufficient documentation

## 2023-07-30 NOTE — Assessment & Plan Note (Signed)
Patient recently evaluated due to increasing respiratory symptoms requiring increased use of albuterol rescue inhaler. She previously worked a job where she was exposed to several triggers such as dust. She was switched to symbicort at that time and advised to wear a mask at work.  Today she states that she is no longer working at that job. In combination with symbicort, she notes drastic improvement of her respiratory symptoms of cough, wheezing, throat irritation. Her voice has changed as well with improvement of throat irritation. She is now using symbicort PRN but has not needed it recently. No infectious symptoms today. Plan:Continue symbicort PRN. Advised patient that if she begins to need this daily, we will reassess her and send in an additional inhaler so that she has both a maintenance and rescue inhaler available.

## 2023-07-30 NOTE — Assessment & Plan Note (Addendum)
Plan:Referral to OB/GYN for pap smear, will need assistance from [facility] given uninsured status.

## 2023-07-30 NOTE — Assessment & Plan Note (Addendum)
Plan:Mammogram ordered today, will need assistance from [facility] given uninsured status.

## 2023-08-03 NOTE — Progress Notes (Signed)
Internal Medicine Clinic Attending  Case discussed with the resident at the time of the visit.  We reviewed the resident's history and exam and pertinent patient test results.  I agree with the assessment, diagnosis, and plan of care documented in the resident's note.  

## 2023-08-05 ENCOUNTER — Other Ambulatory Visit: Payer: Self-pay | Admitting: Obstetrics and Gynecology

## 2023-08-05 DIAGNOSIS — Z1231 Encounter for screening mammogram for malignant neoplasm of breast: Secondary | ICD-10-CM

## 2023-09-24 ENCOUNTER — Other Ambulatory Visit (HOSPITAL_COMMUNITY): Payer: Self-pay

## 2023-09-24 ENCOUNTER — Other Ambulatory Visit: Payer: Self-pay

## 2023-09-24 ENCOUNTER — Other Ambulatory Visit: Payer: Self-pay | Admitting: Student

## 2023-09-24 DIAGNOSIS — F3341 Major depressive disorder, recurrent, in partial remission: Secondary | ICD-10-CM

## 2023-09-24 MED ORDER — ESCITALOPRAM OXALATE 10 MG PO TABS
10.0000 mg | ORAL_TABLET | Freq: Every day | ORAL | 1 refills | Status: AC
  Filled 2023-09-24: qty 30, 30d supply, fill #0
  Filled 2023-09-24: qty 90, 90d supply, fill #0

## 2023-09-30 ENCOUNTER — Inpatient Hospital Stay: Admission: RE | Admit: 2023-09-30 | Payer: Self-pay | Source: Ambulatory Visit

## 2023-09-30 ENCOUNTER — Ambulatory Visit: Payer: Self-pay

## 2023-09-30 ENCOUNTER — Other Ambulatory Visit (HOSPITAL_COMMUNITY): Payer: Self-pay

## 2023-12-03 ENCOUNTER — Encounter: Payer: Self-pay | Admitting: Obstetrics and Gynecology

## 2023-12-24 ENCOUNTER — Other Ambulatory Visit (HOSPITAL_COMMUNITY): Payer: Self-pay

## 2024-01-05 ENCOUNTER — Other Ambulatory Visit (HOSPITAL_COMMUNITY): Payer: Self-pay

## 2024-09-19 ENCOUNTER — Other Ambulatory Visit (HOSPITAL_COMMUNITY): Payer: Self-pay

## 2024-09-19 ENCOUNTER — Other Ambulatory Visit: Payer: Self-pay | Admitting: Student

## 2024-09-19 DIAGNOSIS — I10 Essential (primary) hypertension: Secondary | ICD-10-CM

## 2024-09-19 DIAGNOSIS — M7581 Other shoulder lesions, right shoulder: Secondary | ICD-10-CM

## 2024-09-19 DIAGNOSIS — K219 Gastro-esophageal reflux disease without esophagitis: Secondary | ICD-10-CM

## 2024-09-19 DIAGNOSIS — E039 Hypothyroidism, unspecified: Secondary | ICD-10-CM

## 2024-09-19 DIAGNOSIS — E785 Hyperlipidemia, unspecified: Secondary | ICD-10-CM

## 2024-09-20 ENCOUNTER — Ambulatory Visit

## 2024-09-20 ENCOUNTER — Other Ambulatory Visit: Payer: Self-pay

## 2024-09-20 ENCOUNTER — Other Ambulatory Visit (HOSPITAL_COMMUNITY): Payer: Self-pay

## 2024-09-20 VITALS — BP 166/108 | HR 86 | Temp 98.4°F | Ht 61.5 in | Wt 234.2 lb

## 2024-09-20 DIAGNOSIS — Z23 Encounter for immunization: Secondary | ICD-10-CM | POA: Diagnosis not present

## 2024-09-20 DIAGNOSIS — K219 Gastro-esophageal reflux disease without esophagitis: Secondary | ICD-10-CM

## 2024-09-20 DIAGNOSIS — Z1231 Encounter for screening mammogram for malignant neoplasm of breast: Secondary | ICD-10-CM

## 2024-09-20 DIAGNOSIS — E039 Hypothyroidism, unspecified: Secondary | ICD-10-CM | POA: Diagnosis not present

## 2024-09-20 DIAGNOSIS — M25562 Pain in left knee: Secondary | ICD-10-CM

## 2024-09-20 DIAGNOSIS — I10 Essential (primary) hypertension: Secondary | ICD-10-CM

## 2024-09-20 DIAGNOSIS — Z79899 Other long term (current) drug therapy: Secondary | ICD-10-CM

## 2024-09-20 DIAGNOSIS — Z79890 Hormone replacement therapy (postmenopausal): Secondary | ICD-10-CM

## 2024-09-20 DIAGNOSIS — E785 Hyperlipidemia, unspecified: Secondary | ICD-10-CM | POA: Diagnosis not present

## 2024-09-20 DIAGNOSIS — Z130 Encounter for screening for diseases of the blood and blood-forming organs and certain disorders involving the immune mechanism: Secondary | ICD-10-CM | POA: Insufficient documentation

## 2024-09-20 DIAGNOSIS — Z1211 Encounter for screening for malignant neoplasm of colon: Secondary | ICD-10-CM | POA: Insufficient documentation

## 2024-09-20 MED ORDER — PANTOPRAZOLE SODIUM 40 MG PO TBEC
40.0000 mg | DELAYED_RELEASE_TABLET | Freq: Every day | ORAL | 3 refills | Status: AC
Start: 2024-09-20 — End: ?
  Filled 2024-09-20 – 2024-10-13 (×2): qty 90, 90d supply, fill #0

## 2024-09-20 MED ORDER — LEVOTHYROXINE SODIUM 88 MCG PO TABS
88.0000 ug | ORAL_TABLET | Freq: Every day | ORAL | 3 refills | Status: AC
  Filled 2024-09-20 – 2024-10-13 (×3): qty 90, 90d supply, fill #0

## 2024-09-20 MED ORDER — PRAVASTATIN SODIUM 40 MG PO TABS
40.0000 mg | ORAL_TABLET | Freq: Every evening | ORAL | 3 refills | Status: AC
  Filled 2024-09-20 – 2024-10-13 (×3): qty 90, 90d supply, fill #0

## 2024-09-20 MED ORDER — AMLODIPINE BESYLATE 10 MG PO TABS
10.0000 mg | ORAL_TABLET | Freq: Every day | ORAL | 1 refills | Status: AC
  Filled 2024-09-20 – 2024-10-13 (×2): qty 90, 90d supply, fill #0

## 2024-09-20 MED ORDER — PRAVASTATIN SODIUM 40 MG PO TABS
40.0000 mg | ORAL_TABLET | Freq: Every evening | ORAL | 3 refills | Status: DC
  Filled 2024-09-20: qty 90, 90d supply, fill #0

## 2024-09-20 MED ORDER — DICLOFENAC SODIUM 1 % EX GEL
2.0000 g | Freq: Four times a day (QID) | CUTANEOUS | 3 refills | Status: AC
Start: 2024-09-20 — End: ?
  Filled 2024-09-20 – 2024-10-13 (×3): qty 100, 13d supply, fill #0

## 2024-09-20 NOTE — Assessment & Plan Note (Signed)
 Patient states that this started last night when she got up to go use the restroom.  Does not know any trauma to the area.  Still able to walk on this foot but does have some associated pain.  On physical exam range of motion is intact although painful at times.  Patient did have some pain within the internal rotation lag which could also point to some hip pathology.  Also noted some crepitus during exam.  She does have a history of arthritis in her back and other joints so this is likely the cause of this pain.    Plan: Patient is allergic to NSAIDs so said that she can take extra strength Tylenol  up to 4 times a day. Encouraged her to keep moving and doing some exercises and stretches to see if this helps the pain as it is likely arthritic. If pain does not improve can trial physical therapy

## 2024-09-20 NOTE — Assessment & Plan Note (Signed)
 Patient is due for mammogram at this time.  Order placed for this imaging

## 2024-09-20 NOTE — Patient Instructions (Addendum)
 Today we discussed the following medical conditions and plan:   For your knee, make sure you are taking your pantoprazole  and you can take some tylenol   up to 4 times a day, see if this helps your pain  For your blood pressure, we will get you back on the amlodipine  10 mg and then we will look at your blood pressure the next time you come in.  For your thyroid , we will check your thyroid  levels today.  In the meantime I will start you back on your normal thyroid  dose and we can change the medications from there  Will get some basic blood work to check your electrolytes, your kidney function, and your blood levels  I have sent referrals for your mammogram and colonoscopy.  You will get your flu shot and pneumonia shot today to  We look forward to seeing you next time. Please call our clinic at [phone] if you have any questions or concerns. The best time to call is Monday-Friday from 9am-4pm, but there is someone available 24/7. If you need medication refills, please notify your pharmacy one week in advance and they will send us  a request.   Thank you for trusting me with your care. Wishing you the best!   Charleen Madera D'Mello, DO  [facility]

## 2024-09-20 NOTE — Assessment & Plan Note (Signed)
 Patient has not had colonoscopy before but in the past has had positive fecal occult test.  Because of this recommended colonoscopy  Plan: Referral sent to GI for colonoscopy

## 2024-09-20 NOTE — Assessment & Plan Note (Signed)
 Patient does have a history of hemorrhoids and says that she has been noticing some blood when she has been wiping after having a bowel movement.  Plan: Will check CBC for any anemia

## 2024-09-20 NOTE — Assessment & Plan Note (Signed)
 Patient has been off all of her medications due to loss of insurance coverage.  States that she has been off these medications for a while.  States that she has gained a lot of weight since last visit and was also cold all the time.  Also reports some shortness of breath.  She could have other causes for this but do want to check thyroid  function and assess for any changes that need to be made to Synthroid .  Concern is that this patient has had an ablation that she should not have any thyroid  hormone function and could be at risk for myxedema coma.  Plan: Will restart Synthroid  88 mcg Check TSH and free T4 and can make adjustments to meds as needed

## 2024-09-20 NOTE — Assessment & Plan Note (Signed)
 Will restart pravastatin  40 mg.  Can check lipid panel in about 3 months

## 2024-09-20 NOTE — Assessment & Plan Note (Signed)
 Patient needs to be well-controlled on amlodipine  10 mg but has been out of medications recently.  Blood pressure was elevated today in the office with initial blood pressure being 160/104 and repeat blood pressure 166/108.   Plan: Will check a BMP to assess for electrolytes and kidney function Will restart amlodipine  10 mg and can assess for further medication at the next visit

## 2024-09-20 NOTE — Progress Notes (Signed)
 Established Patient Office Visit  Subjective   Patient ID: Shannon Newton, female    DOB: 04/10/1966  Age: 58 y.o. MRN: [id number]  Chief Complaint  Patient presents with   Medical Management of Chronic Issues    Overdue for visit, out of medicatis for a while nows needs refills     HPI Past medical history of hypothyroidism, hypertension, hyperlipidemia, reactive airway disease presents today for follow-up.  See problem-based assessment for details    ROS See problem-based assessment for more details   Objective:     BP (!) 166/108 (BP Location: Right Arm, Patient Position: Sitting, Cuff Size: Normal)   Pulse 86   Temp 98.4 F (36.9 C) (Oral)   Ht 5' 1.5 (1.562 m)   Wt 234 lb 3.2 oz (106.2 kg)   SpO2 97%   BMI 43.54 kg/m  BP Readings from Last 3 Encounters:  09/20/24 (!) 166/108  07/29/23 125/69  07/15/23 (!) 127/94   Wt Readings from Last 3 Encounters:  09/20/24 234 lb 3.2 oz (106.2 kg)  07/29/23 213 lb 8 oz (96.8 kg)  07/15/23 211 lb 3.2 oz (95.8 kg)      Physical Exam Constitution: Alert, in no acute distress Heart: Regular rate and rhythm, no murmurs heard Lungs: No respiratory effort, clear to auscultation MSK: Intact range of motion bilaterally with flexion extension internal rotation external rotation of lower extremities.  No temperature difference between the right knee and left knee.  Some swelling noted on left compared to right but otherwise no erythema and no open wounds noted.  Crepitus noted on exam of the left knee  No results found for any visits on 09/20/24.     The 10-year ASCVD risk score (Arnett DK, et al., 2019) is: 13.2%    Assessment & Plan:   Problem List Items Addressed This Visit     Hypothyroidism   Patient has been off all of her medications due to loss of insurance coverage.  States that she has been off these medications for a while.  States that she has gained a lot of weight since last visit and was also cold all  the time.  Also reports some shortness of breath.  She could have other causes for this but do want to check thyroid  function and assess for any changes that need to be made to Synthroid .  Concern is that this patient has had an ablation that she should not have any thyroid  hormone function and could be at risk for myxedema coma.  Plan: Will restart Synthroid  88 mcg Check TSH and free T4 and can make adjustments to meds as needed       Relevant Medications   levothyroxine  (SYNTHROID ) 88 MCG tablet   Other Relevant Orders   TSH + free T4   Hypertension   Patient needs to be well-controlled on amlodipine  10 mg but has been out of medications recently.  Blood pressure was elevated today in the office with initial blood pressure being 160/104 and repeat blood pressure 166/108.   Plan: Will check a BMP to assess for electrolytes and kidney function Will restart amlodipine  10 mg and can assess for further medication at the next visit      Relevant Medications   amLODipine  (NORVASC ) 10 MG tablet   pravastatin  (PRAVACHOL ) 40 MG tablet   Other Relevant Orders   Basic metabolic panel with GFR   Hyperlipidemia   Will restart pravastatin  40 mg.  Can check lipid panel in about 3  months      Relevant Medications   amLODipine  (NORVASC ) 10 MG tablet   pravastatin  (PRAVACHOL ) 40 MG tablet   Encounter for screening mammogram for malignant neoplasm of breast   Relevant Orders   MM 3D DIAGNOSTIC MAMMOGRAM BILATERAL BREAST   Screening for deficiency anemia - Primary   Patient does have a history of hemorrhoids and says that she has been noticing some blood when she has been wiping after having a bowel movement.  Plan: Will check CBC for any anemia      Relevant Orders   CBC with Diff   Encounter for screening colonoscopy   Patient has not had colonoscopy before but in the past has had positive fecal occult test.  Because of this recommended colonoscopy  Plan: Referral sent to GI for  colonoscopy      Relevant Orders   Ambulatory referral to Gastroenterology   Encounter for immunization   Relevant Orders   Pneumococcal conjugate vaccine 20-valent (Completed)   Flu vaccine trivalent PF, 6mos and older(Flulaval,Afluria,Fluarix,Fluzone) (Completed)   Gastroesophageal reflux disease   Relevant Medications   pantoprazole  (PROTONIX ) 40 MG tablet   Left knee pain   Patient states that this started last night when she got up to go use the restroom.  Does not know any trauma to the area.  Still able to walk on this foot but does have some associated pain.  On physical exam range of motion is intact although painful at times.  Patient did have some pain within the internal rotation lag which could also point to some hip pathology.  Also noted some crepitus during exam.  She does have a history of arthritis in her back and other joints so this is likely the cause of this pain.    Plan: Patient is allergic to NSAIDs so said that she can take extra strength Tylenol  up to 4 times a day. Encouraged her to keep moving and doing some exercises and stretches to see if this helps the pain as it is likely arthritic. If pain does not improve can trial physical therapy         Return in about 4 weeks (around 10/18/2024).    Shannon Filippini D'Mello, DO Patient seen with Dr.Winfrey

## 2024-09-21 ENCOUNTER — Other Ambulatory Visit (HOSPITAL_COMMUNITY): Payer: Self-pay

## 2024-09-21 LAB — CBC WITH DIFFERENTIAL/PLATELET
Basophils Absolute: 0 x10E3/uL (ref 0.0–0.2)
Basos: 0 %
EOS (ABSOLUTE): 0.1 x10E3/uL (ref 0.0–0.4)
Eos: 1 %
Hematocrit: 43.4 % (ref 34.0–46.6)
Hemoglobin: 13.2 g/dL (ref 11.1–15.9)
Immature Grans (Abs): 0 x10E3/uL (ref 0.0–0.1)
Immature Granulocytes: 0 %
Lymphocytes Absolute: 2.8 x10E3/uL (ref 0.7–3.1)
Lymphs: 35 %
MCH: 23.4 pg — ABNORMAL LOW (ref 26.6–33.0)
MCHC: 30.4 g/dL — ABNORMAL LOW (ref 31.5–35.7)
MCV: 77 fL — ABNORMAL LOW (ref 79–97)
Monocytes Absolute: 0.7 x10E3/uL (ref 0.1–0.9)
Monocytes: 8 %
Neutrophils Absolute: 4.4 x10E3/uL (ref 1.4–7.0)
Neutrophils: 56 %
Platelets: 341 x10E3/uL (ref 150–450)
RBC: 5.64 x10E6/uL — ABNORMAL HIGH (ref 3.77–5.28)
RDW: 15.5 % — ABNORMAL HIGH (ref 11.7–15.4)
WBC: 8 x10E3/uL (ref 3.4–10.8)

## 2024-09-21 LAB — TSH+FREE T4
Free T4: 0.69 ng/dL — ABNORMAL LOW (ref 0.82–1.77)
TSH: 6.55 u[IU]/mL — ABNORMAL HIGH (ref 0.450–4.500)

## 2024-09-21 LAB — BASIC METABOLIC PANEL WITH GFR
BUN/Creatinine Ratio: 15 (ref 12–28)
BUN: 10 mg/dL (ref 8–27)
CO2: 20 mmol/L (ref 20–29)
Calcium: 9.6 mg/dL (ref 8.7–10.3)
Chloride: 104 mmol/L (ref 96–106)
Creatinine, Ser: 0.68 mg/dL (ref 0.57–1.00)
Glucose: 85 mg/dL (ref 70–99)
Potassium: 4 mmol/L (ref 3.5–5.2)
Sodium: 142 mmol/L (ref 134–144)
eGFR: 100 mL/min/1.73 (ref >59)

## 2024-09-22 ENCOUNTER — Ambulatory Visit: Payer: Self-pay

## 2024-09-22 NOTE — Progress Notes (Signed)
 Internal Medicine Clinic Attending  I was physically present during the key portions of the resident provided service and participated in the medical decision making of patient's management care. I reviewed pertinent patient test results.  The assessment, diagnosis, and plan were formulated together and I agree with the documentation in the resident's note.  Francesco Elsie NOVAK, MD

## 2024-09-28 ENCOUNTER — Other Ambulatory Visit: Payer: Self-pay

## 2024-09-29 ENCOUNTER — Other Ambulatory Visit: Payer: Self-pay

## 2024-10-12 ENCOUNTER — Other Ambulatory Visit (HOSPITAL_COMMUNITY): Payer: Self-pay

## 2024-10-13 ENCOUNTER — Other Ambulatory Visit: Payer: Self-pay

## 2024-10-13 ENCOUNTER — Other Ambulatory Visit (HOSPITAL_COMMUNITY): Payer: Self-pay

## 2024-10-22 NOTE — Patient Instructions (Incomplete)
 Thank you, [name] for allowing us  to provide your care today. Today we discussed your thryoid, blood pressure, and need for mammogram and colonoscopy.    Referrals: -  New medications: -  I have ordered the following labs for you:  Lab Orders  No laboratory test(s) ordered today     I will call if any are abnormal. All of your labs can be accessed through My Chart.   My Chart Access: [email or web address]  Please follow-up in: 2 months for your cholesterol and BP check    We look forward to seeing you next time. Please call our clinic at [phone] if you have any questions or concerns. The best time to call is Monday-Friday from 9am-4pm, but there is someone available 24/7. If after hours or the weekend, call the main hospital number and ask for the Internal Medicine Resident On-Call. If you need medication refills, please notify your pharmacy one week in advance and they will send us  a request.   Thank you for letting us  take part in your care. Wishing you the best!  Dagan Heinz, DO 10/23/2024, 7:52 AM [facility]

## 2024-10-22 NOTE — Progress Notes (Deleted)
   Established Patient Office Visit  Subjective   Patient ID: Shannon Newton, female    DOB: 01-21-1966  Age: 58 y.o. MRN: [id number]  No chief complaint on file.   Shannon Newton is a 58 year old female with a past medical history of hypothyroidism, hypertension, hyperlipidemia, and reactive airway disease who presents today for follow up on her chronic conditions. Please see problem-based assessment and plan below for details.      ROS    Objective:    There were no vitals taken for this visit. Physical Exam   No results found for any visits on 10/23/24.  The 10-year ASCVD risk score (Arnett DK, et al., 2019) is: 13.2%    Assessment & Plan:   Patient seen with {[email or web address]/2026:[id number]}.  Problem List Items Addressed This Visit       Cardiovascular and Mediastinum   Hypertension - Primary   BP today ___. Patient restarted on Amlodipine  10mg  1 month ago. Reports ____ side effects. Last BMP 10/9 showed normal renal function.   -Continue Amlodipine  10mg  daily        Endocrine   Hypothyroidism   Patient was restarted on Synthroid  during last visit due to lapse in insurance coverage. She had experienced weight gain, cold intolerance, and shortness of breath, which she reports is currently ___ today. Last TSH elevated at 6.55 with free T4 low at 0.69 before resuming Synthroid .   -Continue Synthroid  -Recheck TSH and fT4 today  -Increase to 100 or if no improvement in TSH/fT4  -ask if taking any calcium, biotin, or iron supplements -compliance? Taking it right?        Other   Healthcare maintenance   Patient is due for mammogram, cervical cancer screening, and colonoscopy at this time. Mammogram order placed during 09/21/2024 visit, as well as GI referral for colonoscopy.   -Pap? Referral?      Hyperlipidemia   Restarted on Pravastatin  40mg  1 month ago. No reported side effects. Lipid panel will need to be rechecked in 2 months.          No follow-ups on file.    Shannon Koeller, DO Internal Medicine Resident, PGY-1 8:03 AM 10/23/2024

## 2024-10-23 ENCOUNTER — Ambulatory Visit

## 2024-10-23 DIAGNOSIS — E039 Hypothyroidism, unspecified: Secondary | ICD-10-CM

## 2024-10-23 DIAGNOSIS — I10 Essential (primary) hypertension: Secondary | ICD-10-CM

## 2024-10-23 NOTE — Assessment & Plan Note (Deleted)
 Patient is due for mammogram, cervical cancer screening, and colonoscopy at this time. Mammogram order placed during 09/21/2024 visit, as well as GI referral for colonoscopy.   -Pap? Referral?

## 2024-10-23 NOTE — Assessment & Plan Note (Deleted)
 BP today ___. Patient restarted on Amlodipine  10mg  1 month ago. Reports ____ side effects. Last BMP 10/9 showed normal renal function.   -Continue Amlodipine  10mg  daily

## 2024-10-23 NOTE — Assessment & Plan Note (Deleted)
 Patient was restarted on Synthroid  during last visit due to lapse in insurance coverage. She had experienced weight gain, cold intolerance, and shortness of breath, which she reports is currently ___ today. Last TSH elevated at 6.55 with free T4 low at 0.69 before resuming Synthroid .   -Continue Synthroid  -Recheck TSH and fT4 today  -Increase to 100 or if no improvement in TSH/fT4  -ask if taking any calcium, biotin, or iron supplements -compliance? Taking it right?

## 2024-10-23 NOTE — Assessment & Plan Note (Deleted)
 Restarted on Pravastatin  40mg  1 month ago. No reported side effects. Lipid panel will need to be rechecked in 2 months.

## 2024-11-27 ENCOUNTER — Other Ambulatory Visit (HOSPITAL_COMMUNITY): Payer: Self-pay

## 2024-11-27 ENCOUNTER — Other Ambulatory Visit: Payer: Self-pay | Admitting: Student

## 2024-11-27 MED ORDER — BUDESONIDE-FORMOTEROL FUMARATE 80-4.5 MCG/ACT IN AERO
2.0000 | INHALATION_SPRAY | Freq: Four times a day (QID) | RESPIRATORY_TRACT | 12 refills | Status: AC | PRN
  Filled 2024-11-27 – 2024-12-29 (×2): qty 10.2, 30d supply, fill #0

## 2024-11-27 MED ORDER — BUDESONIDE-FORMOTEROL FUMARATE 80-4.5 MCG/ACT IN AERO
2.0000 | INHALATION_SPRAY | Freq: Four times a day (QID) | RESPIRATORY_TRACT | 12 refills | Status: DC | PRN
  Filled 2024-11-27: qty 10.2, 30d supply, fill #0

## 2024-11-27 NOTE — Telephone Encounter (Signed)
 Medication sent to pharmacy

## 2024-11-27 NOTE — Addendum Note (Signed)
 Addended by: Jahzion Brogden on: 11/27/2024 01:49 PM   Modules accepted: Orders

## 2024-12-06 ENCOUNTER — Other Ambulatory Visit (HOSPITAL_COMMUNITY): Payer: Self-pay

## 2024-12-21 ENCOUNTER — Telehealth: Payer: Self-pay

## 2024-12-21 ENCOUNTER — Encounter: Payer: Self-pay | Admitting: Internal Medicine

## 2024-12-21 NOTE — Telephone Encounter (Signed)
 In preparing for patient PV prior to colonoscopy which is currently scheduled at [facility] on 01/22/25, RN observed that patient has hx of reactive airway disease. RN contacting Norleen Schillings, CRNA, for his analysis of whether patient needs colonoscopy procedure to be conducted at hospital rather than [facility].

## 2024-12-22 NOTE — Telephone Encounter (Signed)
 Dr. San scheduled reviewed and noted that pt does have availability for 01/02/2025.   Left message for pt to call back

## 2024-12-22 NOTE — Telephone Encounter (Signed)
 Forwarding to Pod B for scheduling at the hospital, OV, etc. Please see note below from Norleen Schillings (difficult airway). Thank you

## 2024-12-22 NOTE — Telephone Encounter (Signed)
 Pt made aware of the recommendations for the procedure to be done at the hospital and why. Procedure in the [facility] was canceled and pt was notified that I would review Dr. San schedule and call her back shortly to assist in getting her procedure schedule in the hospital.  Pt verbalized understanding with all questions answered.

## 2024-12-25 ENCOUNTER — Other Ambulatory Visit: Payer: Self-pay

## 2024-12-25 DIAGNOSIS — Z1211 Encounter for screening for malignant neoplasm of colon: Secondary | ICD-10-CM

## 2024-12-25 NOTE — Telephone Encounter (Signed)
 Pt was scheduled on 01/02/2025 at 11:45 AM for the Colonoscopy with Dr. San. Pt made aware.  Pt has previsit tomorrow on 12/26/2024 at 10:30 to discuss instructions.  Pt verbalized understanding with all questions answered.

## 2024-12-26 ENCOUNTER — Ambulatory Visit

## 2024-12-26 ENCOUNTER — Other Ambulatory Visit (HOSPITAL_COMMUNITY): Payer: Self-pay

## 2024-12-26 VITALS — Ht 61.5 in | Wt 236.0 lb

## 2024-12-26 DIAGNOSIS — Z1211 Encounter for screening for malignant neoplasm of colon: Secondary | ICD-10-CM

## 2024-12-26 MED ORDER — NA SULFATE-K SULFATE-MG SULF 17.5-3.13-1.6 GM/177ML PO SOLN
1.0000 | Freq: Once | ORAL | 0 refills | Status: AC
  Filled 2024-12-26 – 2024-12-29 (×2): qty 354, 1d supply, fill #0

## 2024-12-26 NOTE — Progress Notes (Signed)
 Pt's name and DOB verified at the beginning of the pre-visit with 2 identifiers   Pt denies any difficulty with ambulating,sitting, laying down or rolling side to side  Pt has no issues moving head neck or swallowing  No egg or soy allergy known to patient   No issues known to pt with past sedation  No FH of Malignant Hyperthermia  Pt is not on home 02   Pt is n  Pt has frequent issues with constipation RN instructed pt to use Miralax  per bottles instructions a week before prep days. Pt states they will  Pt is not on dialysis  Pt denise any abnormal heart rhythms   Pt denies any upcoming cardiac testing  Patient's chart reviewed by Norleen Schillings CNRA prior to pre-visit and patient appropriate for the [facility].  Pre-visit completed and red dot placed by patient's name on their procedure day (on provider's schedule).     Visit in person  Pt states weight is 236 lb   Pt given  both [facility] main # and MD on call # prior to instructions.  Informed pt to come in at the time discussed and is shown on PV instructions.  Pt instructed to use Single[employer] or GoodRx for a price reduction on prep  Instructed pt where to find PV instructions in My Chart and a copy of instructions given to patient Instructed pt on all aspects of written instructions including med holds clothing to wear and foods to eat and not eat as well as after procedure legal restrictions and to call MD on call if needed.. Pt states understanding. Instructed pt to review instructions again prior to procedure and call main # given if has any questions or any issues. Pt states they will.

## 2024-12-28 ENCOUNTER — Encounter (HOSPITAL_COMMUNITY): Payer: Self-pay | Admitting: Gastroenterology

## 2024-12-29 ENCOUNTER — Other Ambulatory Visit (HOSPITAL_COMMUNITY): Payer: Self-pay

## 2024-12-29 ENCOUNTER — Other Ambulatory Visit: Payer: Self-pay

## 2025-01-02 ENCOUNTER — Encounter (HOSPITAL_COMMUNITY): Payer: Self-pay | Admitting: Gastroenterology

## 2025-01-02 ENCOUNTER — Other Ambulatory Visit: Payer: Self-pay

## 2025-01-02 ENCOUNTER — Encounter (HOSPITAL_COMMUNITY)
Admission: RE | Disposition: A | Discharge status: Home / Self Care | Payer: Self-pay | Source: Ambulatory Visit | Attending: Gastroenterology

## 2025-01-02 ENCOUNTER — Ambulatory Visit (HOSPITAL_COMMUNITY)
Admission: RE | Admit: 2025-01-02 | Discharge: 2025-01-02 | Disposition: A | Discharge status: Home / Self Care | Source: Ambulatory Visit | Attending: Gastroenterology | Admitting: Gastroenterology

## 2025-01-02 ENCOUNTER — Ambulatory Visit (HOSPITAL_COMMUNITY): Admitting: Certified Registered"

## 2025-01-02 DIAGNOSIS — I10 Essential (primary) hypertension: Secondary | ICD-10-CM | POA: Insufficient documentation

## 2025-01-02 DIAGNOSIS — G4730 Sleep apnea, unspecified: Secondary | ICD-10-CM | POA: Insufficient documentation

## 2025-01-02 DIAGNOSIS — K640 First degree hemorrhoids: Secondary | ICD-10-CM

## 2025-01-02 DIAGNOSIS — E039 Hypothyroidism, unspecified: Secondary | ICD-10-CM | POA: Insufficient documentation

## 2025-01-02 DIAGNOSIS — F418 Other specified anxiety disorders: Secondary | ICD-10-CM | POA: Diagnosis not present

## 2025-01-02 DIAGNOSIS — K5730 Diverticulosis of large intestine without perforation or abscess without bleeding: Secondary | ICD-10-CM

## 2025-01-02 DIAGNOSIS — M1990 Unspecified osteoarthritis, unspecified site: Secondary | ICD-10-CM | POA: Diagnosis not present

## 2025-01-02 DIAGNOSIS — K219 Gastro-esophageal reflux disease without esophagitis: Secondary | ICD-10-CM | POA: Diagnosis not present

## 2025-01-02 DIAGNOSIS — J45909 Unspecified asthma, uncomplicated: Secondary | ICD-10-CM | POA: Insufficient documentation

## 2025-01-02 DIAGNOSIS — K648 Other hemorrhoids: Secondary | ICD-10-CM | POA: Diagnosis not present

## 2025-01-02 DIAGNOSIS — Z800 Family history of malignant neoplasm of digestive organs: Secondary | ICD-10-CM | POA: Insufficient documentation

## 2025-01-02 DIAGNOSIS — Z1211 Encounter for screening for malignant neoplasm of colon: Secondary | ICD-10-CM

## 2025-01-02 DIAGNOSIS — Z83719 Family history of colon polyps, unspecified: Secondary | ICD-10-CM | POA: Insufficient documentation

## 2025-01-02 HISTORY — PX: COLONOSCOPY: SHX5424

## 2025-01-02 MED ORDER — LIDOCAINE 2% (20 MG/ML) 5 ML SYRINGE
INTRAMUSCULAR | Status: DC | PRN
  Administered 2025-01-02: 40 mg via INTRAVENOUS

## 2025-01-02 MED ORDER — PROPOFOL 500 MG/50ML IV EMUL
INTRAVENOUS | Status: DC | PRN
  Administered 2025-01-02: 180 ug/kg/min via INTRAVENOUS

## 2025-01-02 MED ORDER — SODIUM CHLORIDE 0.9 % IV SOLN: INTRAVENOUS | Status: DC

## 2025-01-02 MED ORDER — LABETALOL HCL 5 MG/ML IV SOLN
INTRAVENOUS | Status: AC
  Filled 2025-01-02: qty 4

## 2025-01-02 MED ORDER — LABETALOL HCL 5 MG/ML IV SOLN
10.0000 mg | Freq: Once | INTRAVENOUS | Status: AC
  Administered 2025-01-02: 10 mg via INTRAVENOUS

## 2025-01-02 MED ORDER — PROPOFOL 10 MG/ML IV BOLUS
INTRAVENOUS | Status: DC | PRN
  Administered 2025-01-02 (×2): 20 mg via INTRAVENOUS

## 2025-01-02 NOTE — Discharge Instructions (Signed)

## 2025-01-02 NOTE — Anesthesia Preprocedure Evaluation (Signed)
"                                    Anesthesia Evaluation  Patient identified by MRN, date of birth, ID band Patient awake    Reviewed: Allergy & Precautions, NPO status , Patient's Chart, lab work & pertinent test results, reviewed documented beta blocker date and time   History of Anesthesia Complications (+) DIFFICULT AIRWAY and history of anesthetic complications  Airway Mallampati: III  TM Distance: >3 FB     Dental no notable dental hx. (+) Chipped   Pulmonary neg shortness of breath, asthma , sleep apnea and Continuous Positive Airway Pressure Ventilation , neg COPD   breath sounds clear to auscultation       Cardiovascular hypertension, (-) CAD, (-) Past MI and (-) Cardiac Stents  Rhythm:Regular Rate:Normal     Neuro/Psych neg Seizures PSYCHIATRIC DISORDERS Anxiety Depression       GI/Hepatic ,GERD  ,,(+) neg Cirrhosis        Endo/Other  Hypothyroidism    Renal/GU Renal disease     Musculoskeletal  (+) Arthritis ,    Abdominal   Peds  Hematology  (+) Blood dyscrasia, anemia   Anesthesia Other Findings   Reproductive/Obstetrics                              Anesthesia Physical Anesthesia Plan  ASA: 2  Anesthesia Plan: MAC   Post-op Pain Management:    Induction: Intravenous  PONV Risk Score and Plan: 2 and Propofol  infusion  Airway Management Planned: Natural Airway and Nasal Cannula  Additional Equipment:   Intra-op Plan:   Post-operative Plan:   Informed Consent: I have reviewed the patients History and Physical, chart, labs and discussed the procedure including the risks, benefits and alternatives for the proposed anesthesia with the patient or authorized representative who has indicated his/her understanding and acceptance.     Dental advisory given  Plan Discussed with: CRNA  Anesthesia Plan Comments:         Anesthesia Quick Evaluation  "

## 2025-01-02 NOTE — Op Note (Signed)
 [facility] Patient Name: Shannon Newton Procedure Date: 01/02/2025 MRN: [id number] Attending MD: Sandor Flatter , MD, [id number] Date of Birth: 1966/11/25 CSN: [id number] Age: 59 Admit Type: Outpatient Procedure:                Colonoscopy Indications:              Screening for colorectal malignant neoplasm, This                            is the patient's first colonoscopy Providers:                Sandor Flatter, MD, Heather Ng, RN, [facility]                            [place], Technician, Leighton Agent, CRNA Referring MD:              Medicines:                Monitored Anesthesia Care Complications:            No immediate complications. Estimated Blood Loss:     Estimated blood loss: none. Procedure:                Pre-Anesthesia Assessment:                           - Prior to the procedure, a History and Physical                            was performed, and patient medications and                            allergies were reviewed. The patient's tolerance of                            previous anesthesia was also reviewed. The risks                            and benefits of the procedure and the sedation                            options and risks were discussed with the patient.                            All questions were answered, and informed consent                            was obtained. Prior Anticoagulants: The patient has                            taken no anticoagulant or antiplatelet agents. ASA                            Grade Assessment: II - A patient with mild systemic  disease. After reviewing the risks and benefits,                            the patient was deemed in satisfactory condition to                            undergo the procedure.                           After obtaining informed consent, the colonoscope                            was passed under direct vision. Throughout the                             procedure, the patient's blood pressure, pulse, and                            oxygen saturations were monitored continuously. The                            CF-HQ190L ([id number]) Olympus colonoscope was                            introduced through the anus and advanced to the the                            terminal ileum. The colonoscopy was performed                            without difficulty. The patient tolerated the                            procedure well. The quality of the bowel                            preparation was good. The terminal ileum, ileocecal                            valve, appendiceal orifice, and rectum were                            photographed. Scope In: 11:18:19 AM Scope Out: 11:28:03 AM Scope Withdrawal Time: 0 hours 7 minutes 14 seconds  Total Procedure Duration: 0 hours 9 minutes 44 seconds  Findings:      The perianal and digital rectal examinations were normal.      A few small-mouthed diverticula were found in the sigmoid colon.      Non-bleeding internal hemorrhoids were found during retroflexion. The       hemorrhoids were small.      The terminal ileum appeared normal. Impression:               - Diverticulosis in the sigmoid colon.                           -  Non-bleeding internal hemorrhoids.                           - The remainder of the colon was normal appearing.                           - The examined portion of the ileum was normal.                           - No specimens collected. Moderate Sedation:      Not Applicable - Patient had care per Anesthesia. Recommendation:           - Patient has a contact number available for                            emergencies. The signs and symptoms of potential                            delayed complications were discussed with the                            patient. Return to normal activities tomorrow.                            Written discharge instructions were provided to the                             patient.                           - Resume previous diet.                           - Continue present medications.                           - Repeat colonoscopy in 10 years for screening                            purposes.                           - Return to GI office PRN. Procedure Code(s):        --- Professional ---                           H9878, Colorectal cancer screening; colonoscopy on                            individual not meeting criteria for high risk Diagnosis Code(s):        --- Professional ---                           Z12.11, Encounter for screening for malignant  neoplasm of colon                           K64.8, Other hemorrhoids                           K57.30, Diverticulosis of large intestine without                            perforation or abscess without bleeding CPT copyright 2022 American [employer] Association. All rights reserved. The codes documented in this report are preliminary and upon coder review may  be revised to meet current compliance requirements. Sandor Flatter, MD 01/02/2025 11:36:55 AM Number of Addenda: 0

## 2025-01-02 NOTE — Progress Notes (Addendum)
 Pt presented to endoscopy today for colonoscopy with MD Cirigliano. Post-operatively, pt profoundly hypertensive with SBPs in the 210s and DBPs in the 100s. Per pt, she has not taken her home BP meds for 2 days. MDA Buck notified. VO for 10 mg labetalol  IV x1. Medication given as ordered. Per MDA Buck, may discharge when BP < 200/100.  Ozell VEAR Pouch, RN 01/02/25 12:17 PM  Addendum 1323: Pt's BP improved s/p labetalol  administration. BP at 1220 was 196/94. Pt discharged to home at 1239 with instructions to take home BP meds. Pt demonstrated understanding.   Ozell VEAR Pouch, RN 01/02/25 1:24 PM

## 2025-01-02 NOTE — Interval H&P Note (Signed)
 History and Physical Interval Note:  01/02/2025 10:50 AM  Shannon Newton  has presented today for surgery, with the diagnosis of screening for colon cancer.  The various methods of treatment have been discussed with the patient and family. After consideration of risks, benefits and other options for treatment, the patient has consented to  Procedures: COLONOSCOPY (N/A) as a surgical intervention.  The patient's history has been reviewed, patient examined, no change in status, stable for surgery.  I have reviewed the patient's chart and labs.  Questions were answered to the patient's satisfaction.     Sandor GAILS Azhar Yogi

## 2025-01-02 NOTE — H&P (Signed)
 "    GASTROENTEROLOGY PROCEDURE H&P NOTE   Primary Care Physician: Charmayne Holmes, DO    Reason for Procedure:  Colon cancer screening  Plan:    Colonoscopy   Patient is appropriate for endoscopic procedure(s) at [facility].  The nature of the procedure, as well as the risks, benefits, and alternatives were carefully and thoroughly reviewed with the patient. Ample time for discussion and questions allowed. The patient understood, was satisfied, and agreed to proceed. I personally addressed all patient questions and concerns.     HPI: Shannon Newton is a 59 y.o. female who presents for colonoscopy for CRC screening.  No recent GI symptoms.  Family history notable for mother with colon polyps and maternal great aunt with colon cancer.  Procedure schedule at [facility] Endoscopy unit due to history of difficult airway.  Past Medical History:  Diagnosis Date   Abdominal pain, recurrent 12/15/2007   per CT of the abd/pelvis (01/26/2008) -  Diffuse inflammatory change of the descending and rectosigmoid colon.  Additional inflammatory changes within the terminal ileum raise concern for inflammatory bowel disease (Crohn's disease).   Anemia    Anxiety    Arthritis    Back pain, chronic 12/15/1999   per MRI in 2001 - disc protrusion L5-6, to the right   Complication of anesthesia    hard to wake up-sob-possible sleep apnea   Constipation    Depression    GERD (gastroesophageal reflux disease)    Hyperlipidemia    Hypertension    Hypothyroidism 12/14/2001   Thyroid  US  done in 2003 (results in [place]) - showed increased 24 hour uptake and the findings were consistent with Grave's disease; patient is s/p RAI therapy (09/19/2002)   Sickle cell trait    Sleep apnea    Urinary frequency     Past Surgical History:  Procedure Laterality Date   BLADDER SUSPENSION  2004   CYSTOCELE REPAIR  2004   done by Dr. Oliva Oiler   CYSTOSCOPY  2008   revision bladder  sling   REVISION URINARY SLING  2006   sling replaced   SHOULDER ARTHROSCOPY WITH SUBACROMIAL DECOMPRESSION Right 04/03/2015   Procedure: RIGHT SHOULDER ARTHROSCOPY WITH SUBACROMIAL DECOMPRESSION, DEBRIDEMENT ;  Surgeon: Kay CHRISTELLA Cummins, MD;  Location: [facility];  Service: Orthopedics;  Laterality: Right;   TUBAL LIGATION      Prior to Admission medications  Medication Sig Start Date End Date Taking? Authorizing Provider  amLODipine  (NORVASC ) 10 MG tablet Take 1 tablet (10 mg total) by mouth daily. 09/20/24   D'Mello, Rosalyn, DO  budesonide -formoterol  (SYMBICORT ) 80-4.5 MCG/ACT inhaler Inhale 2 puffs into the lungs every 6 (six) hours as needed. 11/27/24   Harrie Bruckner, DO  Cholecalciferol  1.25 MG (50000 UT) capsule Take 1 capsule (50,000 Units total) by mouth every 7 (seven) days. Patient not taking: Reported on 12/26/2024 05/23/22   Lou Claretta CHRISTELLA, MD  diclofenac  Sodium (VOLTAREN ) 1 % GEL Apply 2 g topically 4 (four) times daily. Patient not taking: Reported on 12/26/2024 09/20/24   Tobie Gaines, DO  escitalopram  (LEXAPRO ) 10 MG tablet Take 1 tablet (10 mg total) by mouth daily. 09/24/23   Addie Perkins, DO  levothyroxine  (SYNTHROID ) 88 MCG tablet Take 1 tablet (88 mcg total) by mouth daily before breakfast. 09/20/24   D'Mello, Rosalyn, DO  pantoprazole  (PROTONIX ) 40 MG tablet Take 1 tablet (40 mg total) by mouth daily. Patient taking differently: Take 40 mg by mouth as needed. 09/20/24  D'Mello, Rosalyn, DO  polyethylene glycol (MIRALAX  / GLYCOLAX ) 17 g packet Take 17 g by mouth daily. Patient not taking: Reported on 12/26/2024 03/03/20   Tran, Bowie, PA-C  pravastatin  (PRAVACHOL ) 40 MG tablet Take 1 tablet (40 mg total) by mouth every evening. 09/20/24   D'Mello, Rosalyn, DO  PROAIR  HFA 108 (90 Base) MCG/ACT inhaler INHALE 1-2 PUFFS INTO THE LUNGS EVERY 6 (SIX) HOURS AS NEEDED FOR WHEEZING OR SHORTNESS OF BREATH. Patient taking differently: as needed. 02/20/21   Sherlean Failing, MD    Current Facility-Administered Medications  Medication Dose Route Frequency Provider Last Rate Last Admin   0.9 %  sodium chloride  infusion   Intravenous Continuous Jaleiah Asay V, DO        Allergies as of 12/25/2024 - Review Complete 06/05/2022  Allergen Reaction Noted   Penicillins Anaphylaxis    Advil  [ibuprofen ] Swelling 06/21/2014   Aspirin Hives 05/09/2008   Codeine Hives 05/09/2008   Hydrocodone Itching 06/03/2010    Family History  Problem Relation Age of Onset   Colon polyps Mother    Colon cancer Maternal Aunt    Stroke Neg Hx    Cancer Neg Hx    Esophageal cancer Neg Hx    Rectal cancer Neg Hx    Stomach cancer Neg Hx     Social History   Socioeconomic History   Marital status: Single    Spouse name: Not on file   Number of children: Not on file   Years of education: Not on file   Highest education level: Not on file  Occupational History   Not on file  Tobacco Use   Smoking status: Never   Smokeless tobacco: Never  Vaping Use   Vaping status: Never Used  Substance and Sexual Activity   Alcohol use: No    Alcohol/week: 0.0 standard drinks of alcohol   Drug use: No   Sexual activity: Not on file  Other Topics Concern   Not on file  Social History Narrative   Not on file   Social Drivers of Health   Tobacco Use: Low Risk (12/28/2024)   Patient History    Smoking Tobacco Use: Never    Smokeless Tobacco Use: Never    Passive Exposure: Not on file  Financial Resource Strain: Not on file  Food Insecurity: Not on file  Transportation Needs: Not on file  Physical Activity: Not on file  Stress: Not on file  Social Connections: Not on file  Intimate Partner Violence: Not on file  Depression (PHQ2-9): High Risk (09/20/2024)   Depression (PHQ2-9)    PHQ-2 Score: 17  Alcohol Screen: Not on file  Housing: Not on file  Utilities: Not on file  Health Literacy: Not on file    Physical Exam: Vital signs in last 24 hours: @There   were no vitals taken for this visit. GEN: NAD EYE: Sclerae anicteric ENT: MMM CV: Non-tachycardic Pulm: CTA b/l GI: Soft, NT/ND NEURO:  Alert & Oriented x 3   Sandor Flatter, DO [facility]   01/02/2025 10:11 AM  "

## 2025-01-02 NOTE — Transfer of Care (Signed)
 Immediate Anesthesia Transfer of Care Note  Patient: Rogers JAYSON Quivers  Procedure(s) Performed: COLONOSCOPY  Patient Location: Endoscopy Unit  Anesthesia Type:MAC  Level of Consciousness: drowsy and patient cooperative  Airway & Oxygen Therapy: Patient Spontanous Breathing and Patient connected to face mask oxygen  Post-op Assessment: Report given to RN and Post -op Vital signs reviewed and stable  Post vital signs: Reviewed and stable  Last Vitals:  Vitals Value Taken Time  BP    Temp    Pulse 97 01/02/25 11:34  Resp 11 01/02/25 11:34  SpO2 94 % 01/02/25 11:34  Vitals shown include unfiled device data.  Last Pain:  Vitals:   01/02/25 1038  TempSrc: Temporal  PainSc: 0-No pain         Complications: No notable events documented.

## 2025-01-02 NOTE — Anesthesia Postprocedure Evaluation (Signed)
"   Anesthesia Post Note  Patient: Shannon Newton  Procedure(s) Performed: COLONOSCOPY     Patient location during evaluation: PACU Anesthesia Type: MAC Level of consciousness: awake and alert Pain management: pain level controlled Vital Signs Assessment: post-procedure vital signs reviewed and stable Respiratory status: spontaneous breathing, nonlabored ventilation, respiratory function stable and patient connected to nasal cannula oxygen Cardiovascular status: stable and blood pressure returned to baseline Postop Assessment: no apparent nausea or vomiting Anesthetic complications: no   No notable events documented.  Last Vitals:  Vitals:   01/02/25 1210 01/02/25 1220  BP: (!) 217/103 (!) 196/94  Pulse: 72 69  Resp: 19 20  Temp:    SpO2: 100% 100%    Last Pain:  Vitals:   01/02/25 1220  TempSrc:   PainSc: 0-No pain                 [name]      "

## 2025-01-04 ENCOUNTER — Encounter (HOSPITAL_COMMUNITY): Payer: Self-pay | Admitting: Gastroenterology

## 2025-01-04 ENCOUNTER — Other Ambulatory Visit (HOSPITAL_COMMUNITY): Payer: Self-pay

## 2025-01-04 MED ORDER — CHLORHEXIDINE GLUCONATE 0.12 % MT SOLN
Freq: Two times a day (BID) | OROMUCOSAL | 0 refills | Status: AC
  Filled 2025-01-04: qty 473, 16d supply, fill #0

## 2025-01-16 ENCOUNTER — Other Ambulatory Visit (HOSPITAL_COMMUNITY): Payer: Self-pay

## 2025-01-22 ENCOUNTER — Encounter: Admitting: Internal Medicine
